# Patient Record
Sex: Male | Born: 1945 | Race: White | Hispanic: No | Smoking: Former smoker
Health system: Southern US, Community
[De-identification: ages and names within clinical notes are randomized; demographics above are authoritative.]

## PROBLEM LIST (undated history)

## (undated) ENCOUNTER — Emergency Department (HOSPITAL_COMMUNITY): Admission: EM | Payer: Medicare Other | Source: Home / Self Care

## (undated) DIAGNOSIS — Z9981 Dependence on supplemental oxygen: Secondary | ICD-10-CM

## (undated) DIAGNOSIS — Z923 Personal history of irradiation: Secondary | ICD-10-CM

## (undated) DIAGNOSIS — I10 Essential (primary) hypertension: Secondary | ICD-10-CM

## (undated) DIAGNOSIS — M545 Low back pain, unspecified: Secondary | ICD-10-CM

## (undated) DIAGNOSIS — G8929 Other chronic pain: Secondary | ICD-10-CM

## (undated) DIAGNOSIS — M549 Dorsalgia, unspecified: Secondary | ICD-10-CM

## (undated) DIAGNOSIS — R51 Headache: Secondary | ICD-10-CM

## (undated) DIAGNOSIS — J9600 Acute respiratory failure, unspecified whether with hypoxia or hypercapnia: Secondary | ICD-10-CM

## (undated) DIAGNOSIS — F329 Major depressive disorder, single episode, unspecified: Secondary | ICD-10-CM

## (undated) DIAGNOSIS — F419 Anxiety disorder, unspecified: Secondary | ICD-10-CM

## (undated) DIAGNOSIS — M1990 Unspecified osteoarthritis, unspecified site: Secondary | ICD-10-CM

## (undated) DIAGNOSIS — J189 Pneumonia, unspecified organism: Secondary | ICD-10-CM

## (undated) DIAGNOSIS — R519 Headache, unspecified: Secondary | ICD-10-CM

## (undated) DIAGNOSIS — F32A Depression, unspecified: Secondary | ICD-10-CM

## (undated) DIAGNOSIS — J449 Chronic obstructive pulmonary disease, unspecified: Secondary | ICD-10-CM

## (undated) DIAGNOSIS — C4920 Malignant neoplasm of connective and soft tissue of unspecified lower limb, including hip: Secondary | ICD-10-CM

## (undated) HISTORY — PX: ELBOW SURGERY: SHX618

## (undated) HISTORY — PX: COLONOSCOPY W/ BIOPSIES AND POLYPECTOMY: SHX1376

## (undated) HISTORY — PX: CARDIAC CATHETERIZATION: SHX172

## (undated) HISTORY — DX: Unspecified osteoarthritis, unspecified site: M19.90

## (undated) HISTORY — DX: Essential (primary) hypertension: I10

## (undated) HISTORY — PX: REPAIR DURAL / CSF LEAK: SUR1169

## (undated) HISTORY — PX: LUMBAR LAMINECTOMY/DECOMPRESSION MICRODISCECTOMY: SHX5026

## (undated) HISTORY — DX: Depression, unspecified: F32.A

## (undated) HISTORY — DX: Malignant neoplasm of connective and soft tissue of unspecified lower limb, including hip: C49.20

## (undated) HISTORY — DX: Low back pain: M54.5

## (undated) HISTORY — PX: LEG SKIN LESION  BIOPSY / EXCISION: SUR473

## (undated) HISTORY — PX: ELBOW FRACTURE SURGERY: SHX616

## (undated) HISTORY — DX: Low back pain, unspecified: M54.50

## (undated) HISTORY — PX: BACK SURGERY: SHX140

## (undated) HISTORY — DX: Major depressive disorder, single episode, unspecified: F32.9

---

## 1977-08-09 DIAGNOSIS — J189 Pneumonia, unspecified organism: Secondary | ICD-10-CM

## 1977-08-09 HISTORY — DX: Pneumonia, unspecified organism: J18.9

## 2003-04-10 ENCOUNTER — Emergency Department (HOSPITAL_COMMUNITY): Admission: EM | Admit: 2003-04-10 | Discharge: 2003-04-10 | Payer: Self-pay | Admitting: Emergency Medicine

## 2003-04-10 ENCOUNTER — Encounter: Payer: Self-pay | Admitting: Emergency Medicine

## 2004-09-07 ENCOUNTER — Ambulatory Visit: Payer: Self-pay | Admitting: Family Medicine

## 2004-09-14 ENCOUNTER — Ambulatory Visit: Payer: Self-pay | Admitting: Family Medicine

## 2006-10-19 ENCOUNTER — Ambulatory Visit: Payer: Self-pay | Admitting: Physician Assistant

## 2006-12-13 ENCOUNTER — Ambulatory Visit (HOSPITAL_COMMUNITY): Admission: RE | Admit: 2006-12-13 | Discharge: 2006-12-13 | Payer: Self-pay | Admitting: Neurosurgery

## 2007-01-04 ENCOUNTER — Emergency Department (HOSPITAL_COMMUNITY): Admission: EM | Admit: 2007-01-04 | Discharge: 2007-01-04 | Payer: Self-pay | Admitting: Emergency Medicine

## 2007-01-06 ENCOUNTER — Inpatient Hospital Stay (HOSPITAL_COMMUNITY): Admission: AD | Admit: 2007-01-06 | Discharge: 2007-01-12 | Payer: Self-pay | Admitting: Neurosurgery

## 2007-01-19 ENCOUNTER — Inpatient Hospital Stay (HOSPITAL_COMMUNITY): Admission: AD | Admit: 2007-01-19 | Discharge: 2007-01-21 | Payer: Self-pay | Admitting: Neurosurgery

## 2007-02-13 ENCOUNTER — Encounter: Admission: RE | Admit: 2007-02-13 | Discharge: 2007-03-09 | Payer: Self-pay | Admitting: Neurosurgery

## 2009-08-09 DIAGNOSIS — C4920 Malignant neoplasm of connective and soft tissue of unspecified lower limb, including hip: Secondary | ICD-10-CM

## 2009-08-09 HISTORY — DX: Malignant neoplasm of connective and soft tissue of unspecified lower limb, including hip: C49.20

## 2009-09-25 DIAGNOSIS — M545 Low back pain, unspecified: Secondary | ICD-10-CM | POA: Insufficient documentation

## 2009-09-25 DIAGNOSIS — J439 Emphysema, unspecified: Secondary | ICD-10-CM

## 2009-09-25 DIAGNOSIS — I10 Essential (primary) hypertension: Secondary | ICD-10-CM

## 2009-09-26 ENCOUNTER — Ambulatory Visit: Payer: Self-pay | Admitting: Pulmonary Disease

## 2009-09-29 ENCOUNTER — Encounter: Payer: Self-pay | Admitting: Pulmonary Disease

## 2009-10-03 ENCOUNTER — Telehealth: Payer: Self-pay | Admitting: Pulmonary Disease

## 2009-10-10 ENCOUNTER — Ambulatory Visit: Payer: Self-pay | Admitting: Pulmonary Disease

## 2009-10-10 ENCOUNTER — Telehealth (INDEPENDENT_AMBULATORY_CARE_PROVIDER_SITE_OTHER): Payer: Self-pay | Admitting: *Deleted

## 2009-10-10 ENCOUNTER — Encounter: Payer: Self-pay | Admitting: Pulmonary Disease

## 2009-10-14 ENCOUNTER — Ambulatory Visit: Payer: Self-pay | Admitting: Pulmonary Disease

## 2009-10-22 ENCOUNTER — Ambulatory Visit: Payer: Self-pay | Admitting: Family Medicine

## 2009-10-22 ENCOUNTER — Encounter (INDEPENDENT_AMBULATORY_CARE_PROVIDER_SITE_OTHER): Payer: Self-pay | Admitting: *Deleted

## 2009-10-22 ENCOUNTER — Encounter: Payer: Self-pay | Admitting: Sports Medicine

## 2009-10-22 DIAGNOSIS — Z7289 Other problems related to lifestyle: Secondary | ICD-10-CM

## 2009-10-22 DIAGNOSIS — F17200 Nicotine dependence, unspecified, uncomplicated: Secondary | ICD-10-CM | POA: Insufficient documentation

## 2009-11-04 ENCOUNTER — Encounter (INDEPENDENT_AMBULATORY_CARE_PROVIDER_SITE_OTHER): Payer: Self-pay | Admitting: *Deleted

## 2009-11-05 ENCOUNTER — Ambulatory Visit: Payer: Self-pay | Admitting: Gastroenterology

## 2009-11-12 ENCOUNTER — Ambulatory Visit: Payer: Self-pay | Admitting: Gastroenterology

## 2009-11-17 ENCOUNTER — Encounter: Payer: Self-pay | Admitting: Gastroenterology

## 2009-11-20 ENCOUNTER — Encounter (INDEPENDENT_AMBULATORY_CARE_PROVIDER_SITE_OTHER): Payer: Self-pay | Admitting: *Deleted

## 2009-12-16 ENCOUNTER — Telehealth: Payer: Self-pay | Admitting: Pulmonary Disease

## 2009-12-18 ENCOUNTER — Ambulatory Visit: Payer: Self-pay | Admitting: Pulmonary Disease

## 2009-12-22 ENCOUNTER — Telehealth (INDEPENDENT_AMBULATORY_CARE_PROVIDER_SITE_OTHER): Payer: Self-pay | Admitting: *Deleted

## 2009-12-23 ENCOUNTER — Telehealth: Payer: Self-pay | Admitting: Pulmonary Disease

## 2009-12-23 ENCOUNTER — Telehealth (INDEPENDENT_AMBULATORY_CARE_PROVIDER_SITE_OTHER): Payer: Self-pay | Admitting: *Deleted

## 2010-01-31 ENCOUNTER — Encounter: Payer: Self-pay | Admitting: Pulmonary Disease

## 2010-04-20 ENCOUNTER — Encounter: Payer: Self-pay | Admitting: Sports Medicine

## 2010-04-20 ENCOUNTER — Ambulatory Visit: Payer: Self-pay | Admitting: Family Medicine

## 2010-04-22 ENCOUNTER — Ambulatory Visit: Payer: Self-pay | Admitting: Family Medicine

## 2010-04-29 ENCOUNTER — Encounter: Payer: Self-pay | Admitting: Sports Medicine

## 2010-04-30 ENCOUNTER — Encounter: Payer: Self-pay | Admitting: *Deleted

## 2010-04-30 ENCOUNTER — Ambulatory Visit: Payer: Self-pay | Admitting: Family Medicine

## 2010-05-06 ENCOUNTER — Ambulatory Visit: Payer: Self-pay | Admitting: Oncology

## 2010-05-07 ENCOUNTER — Encounter: Payer: Self-pay | Admitting: Sports Medicine

## 2010-05-13 ENCOUNTER — Encounter: Admission: RE | Admit: 2010-05-13 | Discharge: 2010-05-13 | Payer: Self-pay | Admitting: Surgery

## 2010-05-25 ENCOUNTER — Ambulatory Visit (HOSPITAL_COMMUNITY): Admission: RE | Admit: 2010-05-25 | Discharge: 2010-05-25 | Payer: Self-pay | Admitting: Surgery

## 2010-06-09 HISTORY — PX: LYMPH NODE DISSECTION: SHX5087

## 2010-06-18 ENCOUNTER — Ambulatory Visit (HOSPITAL_COMMUNITY): Admission: RE | Admit: 2010-06-18 | Discharge: 2010-06-18 | Payer: Self-pay | Admitting: Surgery

## 2010-06-29 ENCOUNTER — Encounter: Payer: Self-pay | Admitting: Sports Medicine

## 2010-07-07 ENCOUNTER — Ambulatory Visit
Admission: RE | Admit: 2010-07-07 | Discharge: 2010-09-08 | Payer: Self-pay | Source: Home / Self Care | Attending: Radiation Oncology | Admitting: Radiation Oncology

## 2010-07-08 ENCOUNTER — Encounter: Payer: Self-pay | Admitting: Sports Medicine

## 2010-07-10 ENCOUNTER — Ambulatory Visit: Payer: Self-pay | Admitting: Oncology

## 2010-07-10 LAB — COMPREHENSIVE METABOLIC PANEL
Albumin: 4.3 g/dL (ref 3.5–5.2)
BUN: 8 mg/dL (ref 6–23)
CO2: 30 mEq/L (ref 19–32)
Calcium: 9.2 mg/dL (ref 8.4–10.5)
Chloride: 90 mEq/L — ABNORMAL LOW (ref 96–112)
Glucose, Bld: 177 mg/dL — ABNORMAL HIGH (ref 70–99)
Potassium: 3.2 mEq/L — ABNORMAL LOW (ref 3.5–5.3)
Sodium: 131 mEq/L — ABNORMAL LOW (ref 135–145)
Total Protein: 7 g/dL (ref 6.0–8.3)

## 2010-07-10 LAB — CBC WITH DIFFERENTIAL/PLATELET
Basophils Absolute: 0.1 10*3/uL (ref 0.0–0.1)
Eosinophils Absolute: 0.1 10*3/uL (ref 0.0–0.5)
HGB: 16.6 g/dL (ref 13.0–17.1)
MCV: 95.8 fL (ref 79.3–98.0)
MONO#: 0.7 10*3/uL (ref 0.1–0.9)
NEUT#: 10 10*3/uL — ABNORMAL HIGH (ref 1.5–6.5)
RBC: 4.92 10*6/uL (ref 4.20–5.82)
RDW: 12 % (ref 11.0–14.6)
WBC: 12.1 10*3/uL — ABNORMAL HIGH (ref 4.0–10.3)

## 2010-07-13 ENCOUNTER — Ambulatory Visit (HOSPITAL_COMMUNITY)
Admission: RE | Admit: 2010-07-13 | Discharge: 2010-07-13 | Payer: Self-pay | Source: Home / Self Care | Admitting: Radiation Oncology

## 2010-08-14 ENCOUNTER — Encounter: Payer: Self-pay | Admitting: Sports Medicine

## 2010-08-14 ENCOUNTER — Ambulatory Visit: Admission: RE | Admit: 2010-08-14 | Discharge: 2010-08-14 | Payer: Self-pay | Source: Home / Self Care

## 2010-08-14 DIAGNOSIS — H9319 Tinnitus, unspecified ear: Secondary | ICD-10-CM | POA: Insufficient documentation

## 2010-08-14 LAB — CONVERTED CEMR LAB
BUN: 5 mg/dL — ABNORMAL LOW (ref 6–23)
CO2: 29 meq/L (ref 19–32)
Calcium: 9.2 mg/dL (ref 8.4–10.5)
Chloride: 107 meq/L (ref 96–112)
Creatinine, Ser: 0.79 mg/dL (ref 0.40–1.50)
Direct LDL: 84 mg/dL
Glucose, Bld: 84 mg/dL (ref 70–99)
Potassium: 3.7 meq/L (ref 3.5–5.3)
Sodium: 142 meq/L (ref 135–145)

## 2010-08-15 ENCOUNTER — Encounter: Payer: Self-pay | Admitting: Sports Medicine

## 2010-09-08 ENCOUNTER — Ambulatory Visit (HOSPITAL_COMMUNITY)
Admission: RE | Admit: 2010-09-08 | Discharge: 2010-09-08 | Payer: Self-pay | Source: Home / Self Care | Attending: Radiation Oncology | Admitting: Radiation Oncology

## 2010-09-08 NOTE — Letter (Signed)
Summary: Previsit letter  [facility]  [address] [place], [place] [address]   Phone: [phone]  Fax: [phone]       10/22/2009 MRN: [id number]  Hector House [address] [place], [place]  [address]  Dear Hector House,  Welcome to the Gastroenterology Division at [facility].    You are scheduled to see a nurse for your pre-procedure visit on 11/05/2009 at 4:00PM on the 3rd floor at [facility], [address]  We ask that you try to arrive at our office 15 minutes prior to your appointment time to allow for check-in.  Your nurse visit will consist of discussing your medical and surgical history, your immediate family medical history, and your medications.    Please bring a complete list of all your medications or, if you prefer, bring the medication bottles and we will list them.  We will need to be aware of both prescribed and over the counter drugs.  We will need to know exact dosage information as well.  If you are on blood thinners (Coumadin, Plavix, Aggrenox, Ticlid, etc.) please call our office today/prior to your appointment, as we need to consult with your physician about holding your medication.   Please be prepared to read and sign documents such as consent forms, a financial agreement, and acknowledgement forms.  If necessary, and with your consent, a friend or relative is welcome to sit-in on the nurse visit with you.  Please bring your insurance card so that we may make a copy of it.  If your insurance requires a referral to see a specialist, please bring your referral form from your primary care physician.  No co-pay is required for this nurse visit.     If you cannot keep your appointment, please call [phone] to cancel or reschedule prior to your appointment date.  This allows [place] the opportunity to schedule an appointment for another patient in need of care.    Thank you for choosing [facility] for your medical needs.  We  appreciate the opportunity to care for you.  Please visit [place] at our website  to learn more about our practice.                     Sincerely.                                                                                                                   The Gastroenterology Division

## 2010-09-08 NOTE — Consult Note (Signed)
Summary: [facility]   Imported By: De Nurse 07/15/2010 15:28:46  _____________________________________________________________________  External Attachment:    Type:   Image     Comment:   External Document

## 2010-09-08 NOTE — Assessment & Plan Note (Signed)
Summary: no visit.  Pt left after pfts without being seen.   Copy to:  [facility] Primary Provider/Referring Provider:  None  CC:  Pt is here for a f/u appt to discuss PFT and CXR results.  .  History of Present Illness: pt left without being seen after pfts.  Allergies: No Known Drug Allergies  Vital Signs:  Patient profile:   65 year old male Height:      71 inches Weight:      120 pounds BMI:     16.80 Cuff size:   regular  Vitals Entered By: Arman Filter LPN (October 11, 1306 1:38 PM)  O2 Flow:  Room air CC: Pt is here for a f/u appt to discuss PFT and CXR results.   Comments Medications reviewed with patient Arman Filter LPN  October 10, 6576 1:38 PM     Other Orders: T-2 View CXR (71020TC) No Charge Patient Arrived (NCPA0) (NCPA0)

## 2010-09-08 NOTE — Assessment & Plan Note (Signed)
Summary: consult for emphysema, dyspnea   Copy to:  [facility] Primary Provider/Referring Provider:  None  CC:  Pulmonary Consult.  History of Present Illness: The pt is a 65y/o male who is referred today for evaluation of dyspnea.  The pt has been told he has emphysema, and apparently recently had spirometry that is unavailable at this time.  He has been having breathing issues for 30yrs, and his symptoms have been progressive in nature.  He can get sob just walking thru his house at times, bringing in groceries from the car, and describes less than one block doe on flat ground.  He does a lot of coughing, productive of white foam and light green at times.  He has been having significant weight loss as well.  I have found an old cxr from 2008 in computer, and shows severe hyperinflation.  The pt currently is on advair that he uses sporadically due to expense.  He also uses as needed proair and duoneb nebulizer treatments two times a day and as needed.  He does not have a primary care physician at this time.  Preventive Screening-Counseling & Management  Alcohol-Tobacco     Smoking Status: current  Current Medications (verified): 1)  Advair Diskus 250-50 Mcg/dose Aepb (Fluticasone-Salmeterol) .Marland Kitchen.. 1 Puff Two Times A Day 2)  Proventil Hfa 108 (90 Base) Mcg/act Aers (Albuterol Sulfate) .... 2 Puffs Every 4-6 Hours As Needed 3)  Ipratropium-Albuterol 0.5-2.5 (3) Mg/69ml Soln (Ipratropium-Albuterol) .Marland Kitchen.. 1 in Neb Two Times A Day 4)  Lisinopril-Hydrochlorothiazide 20-25 Mg Tabs (Lisinopril-Hydrochlorothiazide) .Marland Kitchen.. 1 Once Daily  Allergies (verified): No Known Drug Allergies  Past History:  Past Medical History:  LOW BACK PAIN SYNDROME (ICD-724.2) HYPERTENSION, BENIGN ESSENTIAL (ICD-401.1) EMPHYSEMA (ICD-492.8)  Past Surgical History: back surgery 2007 L elbow surgery 1967  Family History: Reviewed history and no changes required. heart disease: mother (m.i.)    Social History: Reviewed history and no changes required. Patient is a current smoker. Started at age 35.  1 ppd. pt is divorced and now single. pt lives alone. pt is retired.  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  The patient denies non-productive cough, coughing up blood, irregular heartbeats, acid heartburn, indigestion, loss of appetite, weight change, abdominal pain, difficulty swallowing, sore throat, headaches, nasal congestion/difficulty breathing through nose, sneezing, itching, ear ache, hand/feet swelling, joint stiffness or pain, rash, change in color of mucus, and fever.    Vital Signs:  Patient profile:   65 year old male Height:      71 inches Weight:      121.13 pounds BMI:     16.96 O2 Sat:      98 % on Room air Temp:     98.0 degrees F oral Pulse rate:   97 / minute BP sitting:   112 / 74  (left arm) Cuff size:   regular  Vitals Entered By: Arman Filter LPN (September 26, 2009 10:42 AM)  O2 Flow:  Room air CC: Pulmonary Consult Comments Medications reviewed with patient Arman Filter LPN  September 26, 2009 10:42 AM    Physical Exam  General:  thin male in nad Eyes:  PERRLA and EOMI.   Nose:  narrowed but patent, and without discharge Mouth:  clear  Neck:  no jvd, tmg, LN Lungs:  very decreased bs throughout, mild wheezing at both bases Heart:  rrr, no mrg Abdomen:  soft and nontender, bs+ Extremities:  no edema noted, no cyanosis pulses intact distally Neurologic:  alert and oriented, moves all 4.   Impression & Recommendations:  Problem # 1:  EMPHYSEMA (ICD-492.8) The pt has probable severe emphysema based on his history, exam, and cxr from 2008.  He has had recent spirometry,  but I do not have those results.  He actually needs full pfts for documentation, and also a cxr given his recent excessive weight loss.  I have had a long discussion with him about emphysema, and how we will have very little success helping his breathing even with the best of medications if he does not stop smoking.  I have counseled him on smoking cessation.  He is having difficulty affording his meds, even with insurance.  I have reminded him the money spent on cigarettes could be used for his inhalers.  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  Medications Added to Medication List This Visit: 1)  Ipratropium-albuterol 0.5-2.5 (3) Mg/42ml Soln (Ipratropium-albuterol) .Marland Kitchen.. 1 in neb two times a day  Other Orders: Consultation Level IV (30865) Pulmonary Referral (Pulmonary) T-2 View CXR (71020TC)  Patient Instructions: 1)  stay on advair for now one in am and pm. 2)  check your insurance plan to see if mail order is cheaper 3)  consider getting your meds from the [place]. 4)  stop smoking completely 5)  will check cxr today in light of weight loss 6)  will schedule you for breathing tests, and see you back same day. 7)  you need to get primary care doctor.  Appended Document: consult for emphysema, dyspnea megan, I do not think he ever got his cxr.  Please call and find out.  Appended Document: consult for emphysema, dyspnea ATC pt at home #.  Was informed  "person I am trying to reach is not accepting calls at this time."  ATC pt at work #.  Line has been disconnected.  Will send pt a letter to call our office.

## 2010-09-08 NOTE — Procedures (Signed)
Summary: Colonoscopy  Patient: Hector House Note: All result statuses are Final unless otherwise noted.  Tests: (1) Colonoscopy (COL)   COL Colonoscopy           DONE     [facility]     [address]     [place], [place]  [address]           COLONOSCOPY PROCEDURE REPORT           PATIENT:  Hector, House  MR#:  [id number]     BIRTHDATE:  05/02/1946, 63 yrs. old  GENDER:  male     ENDOSCOPIST:  Vania Rea. Jarold Motto, MD, [facility]     REF. BY:     PROCEDURE DATE:  11/12/2009     PROCEDURE:  Colonoscopy with snare polypectomy     ASA CLASS:  Class III     INDICATIONS:  Routine Risk Screening     MEDICATIONS:   Fentanyl 50 mcg IV, Versed 6 mg IV           DESCRIPTION OF PROCEDURE:   After the risks benefits and     alternatives of the procedure were thoroughly explained, informed     consent was obtained.  Digital rectal exam was performed and     revealed no abnormalities.   The LB [id number]-[id number] endoscope     was introduced through the anus and advanced to the cecum, which     was identified by both the appendix and ileocecal valve, without     limitations.  The quality of the prep was excellent, using     MoviPrep.  The instrument was then slowly withdrawn as the colon     was fully examined.     <<PROCEDUREIMAGES>>           FINDINGS:  ENDOSCOPIC FINDINGS:   A sessile polyp was found in the     sigmoid colon.  CM FLAT POLYP HOT SNARE EXCISED. There were     multiple polyps identified and removed. in the right colon. FLAT     6-8 MM POLYPS HOT SNARE EXCISED.  This was otherwise a normal     examination of the colon.   Retroflexed views in the rectum     revealed no abnormalities.    The scope was then withdrawn from     the patient and the procedure completed.           COMPLICATIONS:  None     ENDOSCOPIC IMPRESSION:     1) Polyps, multiple in the right colon     2) Sessile polyp in the sigmoid colon     3) Otherwise normal examination     R/O ADENOMAS.  RECOMMENDATIONS:     1) If the polyp(s) removed today are proven to be adenomatous     (pre-cancerous) polyps, you will need a repeat colonoscopy in 5     years. Otherwise you should continue to follow colorectal cancer     screening guidelines for "routine risk" patients with colonoscopy     in 10 years.     REPEAT EXAM:  No           ______________________________     Vania Rea. Jarold Motto, MD, [facility]           CC:           n.     eSIGNED:   Vania Rea. Patterson at 11/12/2009 11:54 AM  [place], [place], [id number]  Note: An exclamation mark (!) indicates a result that was not dispersed into the flowsheet. Document Creation Date: 11/12/2009 11:55 AM _______________________________________________________________________  (1) Order result status: Final Collection or observation date-time: 11/12/2009 11:50 Requested date-time:  Receipt date-time:  Reported date-time:  Referring Physician:   Ordering Physician: Sheryn Bison [phone]) Specimen Source:  Source: Launa Grill Order Number: [id number] Lab site:   Appended Document: Colonoscopy     Procedures Next Due Date:    Colonoscopy: 11/2014

## 2010-09-08 NOTE — Assessment & Plan Note (Signed)
Summary: rov for severe emphysema   Copy to:  [facility] Primary Provider/Referring Provider:  None  CC:  Follow up to disuss PFTs and CXR.  Hector House  History of Present Illness: the pt comes in today for f/u of his pfts.  He was found to have severe airflow obstruction most c/w emphysema.  He did have a positive response to bronchodilators.  His cxr showed emphysematous changes, but no acute process.  I have gone over the testing with the patient in detail, and have answered all of his questions.  Current Medications (verified): 1)  Advair Diskus 250-50 Mcg/dose Aepb (Fluticasone-Salmeterol) .Hector House.. 1 Puff Two Times A Day 2)  Proventil Hfa 108 (90 Base) Mcg/act Aers (Albuterol Sulfate) .... 2 Puffs Every 4-6 Hours As Needed 3)  Ipratropium-Albuterol 0.5-2.5 (3) Mg/98ml Soln (Ipratropium-Albuterol) .Hector House.. 1 in Neb Two Times A Day 4)  Lisinopril-Hydrochlorothiazide 20-25 Mg Tabs (Lisinopril-Hydrochlorothiazide) .Hector House.. 1 Once Daily  Allergies (verified): No Known Drug Allergies  Vital Signs:  Patient profile:   65 year old male Height:      71 inches Weight:      124 pounds BMI:     17.36 O2 Sat:      94 % on Room air Temp:     97.8 degrees F oral Pulse rate:   100 / minute BP sitting:   112 / 80  (right arm) Cuff size:   regular  Vitals Entered By: Gweneth Dimitri RN (October 14, 2009 9:54 AM)  O2 Flow:  Room air  Serial Vital Signs/Assessments:  Comments: 11:03 AM Ambulatory Pulse Oximetry  Resting; HR__97___    02 Sat__93___  Lap1 (185 feet)   HR__92___   02 Sat__97__ Lap2 (185 feet)   HR__97___   02 Sat__96___    Lap3 (185 feet)   HR__90___   02 Sat__97___  _x__Test Completed without Difficulty ___Test Stopped due to:  By: Michel Bickers CMA   CC: Follow up to disuss PFTs and CXR.   Comments Medications reviewed with patient Daytime contact number verified with patient. Gweneth Dimitri RN  October 14, 2009 9:54 AM    Physical Exam  General:  thin male in  nad   Impression & Recommendations:  Problem # 1:  EMPHYSEMA (ICD-492.8)  The pt has severe emphysema by pfts, and it is unclear how much his airflow obstruction may improve with smoking cessation and an aggressive bronchodilator regimen.  Will add spiriva to his regimen, and also discussed with him the role of vaccinations and pulmonary rehab.  I have also explained that he has very little pulmonary reserve, and the most important thing he can do now is quit smoking.  Time spent with pt today was .  Medications Added to Medication List This Visit: 1)  Spiriva Handihaler 18 Mcg Caps (Tiotropium bromide monohydrate) .... One puff in handihaler daily  Other Orders: Est. Patient Level III (40981) Pneumococcal Vaccine (19147) Admin 1st Vaccine (82956) Misc. Referral (Misc. Ref) Rehabilitation Referral (Rehab) Pulse Oximetry, Ambulatory (21308)  Patient Instructions: 1)  stay on advair 250/50 one in am and pm everyday....rinse mouth well. 2)  start spiriva one each am. 3)  can use proair 2 puffs up to every 6hrs if needed for emergencies when away from home. 4)  Do not use nebulizer machine unless having difficulties...use for rescue on bad days. 5)  will refer to pulmonary rehab program at [facility] 6)  you MUST stop smoking 7)  followup with me in 2mos  Prescriptions: SPIRIVA HANDIHALER 18 MCG  CAPS (TIOTROPIUM BROMIDE MONOHYDRATE) one puff in handihaler daily  #30 x 6   Entered and Authorized by:   Barbaraann Share MD   Signed by:   Barbaraann Share MD on 10/14/2009   Method used:   Print then Give to Patient   RxID:   [id number] PROVENTIL HFA 108 (90 BASE) MCG/ACT AERS (ALBUTEROL SULFATE) 2 puffs every 4-6 hours as needed  #1 x 6   Entered and Authorized by:   Barbaraann Share MD   Signed by:   Barbaraann Share MD on 10/14/2009   Method used:   Print then Give to Patient   RxID:   [id number] ADVAIR DISKUS 250-50 MCG/DOSE AEPB (FLUTICASONE-SALMETEROL) 1 puff two  times a day  #1 x 6   Entered and Authorized by:   Barbaraann Share MD   Signed by:   Barbaraann Share MD on 10/14/2009   Method used:   Print then Give to Patient   RxID:   [id number]    Immunizations Administered:  Pneumonia Vaccine:    Vaccine Type: Pneumovax (Medicare)    Site: left deltoid    Mfr: Merck    Dose: 0.5 ml    Route: IM    Given by: Michel Bickers CMA    Exp. Date: 03/23/2011    Lot #: [id number]    VIS given: 03/06/96 version given October 14, 2009.

## 2010-09-08 NOTE — Miscellaneous (Signed)
Summary: [facility]  Clinical Lists Changes  Medications: Added new medication of MOVIPREP 100 GM  SOLR (PEG-KCL-NACL-NASULF-NA ASC-C) As per prep instructions. - Signed Rx of MOVIPREP 100 GM  SOLR (PEG-KCL-NACL-NASULF-NA ASC-C) As per prep instructions.;  #1 x 0;  Signed;  Entered by: Ezra Sites RN;  Authorized by: Mardella Layman MD [facility];  Method used: [facility]  [address] [phone]*, [address], [place], [place], [place]  [address], Ph: [phone] or [phone], Fax: [phone] Observations: Added new observation of NKA: T (11/05/2009 15:52)    Prescriptions: MOVIPREP 100 GM  SOLR (PEG-KCL-NACL-NASULF-NA ASC-C) As per prep instructions.  #1 x 0   Entered by:   Ezra Sites RN   Authorized by:   Mardella Layman MD [facility]   Signed by:   Ezra Sites RN on 11/05/2009   Method used:   [facility]        [facility]  [address] [phone]* (retail)       [address]       [place], [place]  [address]       Ph: [phone] or [phone]       Fax: [phone]   RxID:   [id number]

## 2010-09-08 NOTE — Progress Notes (Signed)
Summary: returned call  Phone Note Call from Patient   Caller: pt's neighbor Call For: Abagael Kramm Summary of Call: pt's neighbor , diane coble, returned call for pt. (pt's minuets on his track phone are depleted). pt received a letter stating we were trying to contact pt. caller says that you can call her phone to leave msg. and she will get pt to call back. I did let her know that pt has a pft/ rov w/ kc on 10/10/09. [place] # [phone] Initial call taken by: Tivis Ringer, CNA,  October 03, 2009 8:56 AM  Follow-up for Phone Call        spoke with Diane, pt neighbor, and advised that pt did not have CXR done at last appt, so I advised him to come in early on 10/10/09 and get cxr before his pft and appt. Diane states she will tell the pt to do so. CXR placed in IDX. Carron Curie CMA  October 03, 2009 9:38 AM

## 2010-09-08 NOTE — Progress Notes (Signed)
Summary: pt still needs to speak to pcc  Phone Note Call from Patient   Caller: Patient Call For: libby Summary of Call: please call pt within the next "few mins" re: neb/ meds. says he uses [facility] and has never used [facility]. pt cell [phone] Initial call taken by: Tivis Ringer, CNA,  Dec 23, 2009 3:49 PM  Follow-up for Phone Call        [facility] can not get the meds pt is to have so we changed to [facility] Follow-up by: Oneita Jolly,  Dec 23, 2009 4:16 PM  Additional Follow-up for Phone Call Additional follow up Details #1::        pt called back. says he still needs to speak to libby as he has several questions re: [facility]/ nebulizor, etc. says you may need to dial the area code before his #. [phone] or even try [phone]. Tivis Ringer, CNA  Dec 23, 2009 4:27 PM Additional Follow-up by: Oneita Jolly,  Dec 23, 2009 4:53 PM

## 2010-09-08 NOTE — Consult Note (Signed)
Summary: [facility]   Imported By: De Nurse 05/27/2010 15:20:58  _____________________________________________________________________  External Attachment:    Type:   Image     Comment:   External Document

## 2010-09-08 NOTE — Letter (Signed)
Summary: Generic Letter  [facility]  [address]   [place], [place] [address]   Phone: [phone]  Fax: [phone]    11/20/2009  [facility] [address] [place], [place]  [address]  Dear Mr. Heidelberger,  I was asked by Dr Benjamin Stain to send you a note asking for you to call to reschedule you lab appointment.  It is a fasting lab, so please do not eat or drink anything after midnight before, except for black coffee and water.  We look forward to your call and be we'll glad to schedule at your earliest convenience.    Sincerely,    De Nurse

## 2010-09-08 NOTE — Progress Notes (Signed)
Summary: needs ov with [facility]   Phone Note Outgoing Call Call back at neighbor, Lafonda Mosses   Call placed by: Arman Filter LPN,  October 10, 2009 4:03 PM Call placed to: Patient Summary of Call: LM on pt's neighbor's machine for pt to call me back.  Pt was scheduled for PFTs, CXR, and OV with Dr. Shelle Iron.  After pt had PFTs and CXR, he never came back up to the 2nd floor to see Dr. Shelle Iron to discuss test results. Pt needs ov with [facility].  Aundra Millet Reynolds LPN  October 10, 1608 4:04 PM  Initial call taken by: Arman Filter LPN,  October 10, 2009 4:04 PM  Follow-up for Phone Call        left message with pt's neighbor for pt to call me back.  Aundra Millet Reynolds LPN  October 14, 9602 3:47 PM   Pt called back - coming in 10/14/2009 @ 10:00 to see [facility]. Follow-up by: Eugene Gavia,  October 13, 2009 3:55 PM

## 2010-09-08 NOTE — Medication Information (Signed)
Summary: [facility]  [facility]   Imported By: Sherian Rein 02/04/2010 15:40:31  _____________________________________________________________________  External Attachment:    Type:   Image     Comment:   External Document

## 2010-09-08 NOTE — Letter (Signed)
Summary: [facility]  [address]   [place], [place] [address]   Phone: [phone]  Fax: [phone]    09/29/2009  Hector House [address] [place], [place]  [address]  Dear Mr. Fishbaugh,  We have attempted to contact you by phone.  However, the phone number we have on file is incorrect.  Please call our office at your earliest convenience and ask to speak to Dr. Teddy Spike nurse.  Thank you.        Sincerely,   Arman Filter LPN

## 2010-09-08 NOTE — Assessment & Plan Note (Signed)
Summary: rov for severe emphysema   Copy to:  [facility] Primary Provider/Referring Provider:  Rodney Langton, MD  CC:  Pt is here for a 2 month f/u appt.  Pt states he is not taking Spiriva and Advair d/t finances.   Pt states breathing is unchanged from last visit.  Pt states he is using nebs bid.  Pt will use rescue hfa 3 to 4 x per day.  Pt is currently smoking 1/2 ppd. .  History of Present Illness: The pt comes in today for f/u of his known severe emphysema.  Unfortunatley, he continues to smoke, and is not taking any of the medication prescribed last visit due to expense.  He is trying to get by on 2 neb treatments a day, and as needed proair.  He continues to have significant doe, cough, no purulence.  Current Medications (verified): 1)  Proventil Hfa 108 (90 Base) Mcg/act Aers (Albuterol Sulfate) .... 2 Puffs Every 4-6 Hours As Needed 2)  Ipratropium-Albuterol 0.5-2.5 (3) Mg/92ml Soln (Ipratropium-Albuterol) .Marland Kitchen.. 1 in Neb Two Times A Day 3)  Lisinopril-Hydrochlorothiazide 20-25 Mg Tabs (Lisinopril-Hydrochlorothiazide) .Marland Kitchen.. 1 Once Daily 4)  Vicodin 5-500 Mg Tabs (Hydrocodone-Acetaminophen) .... One Tab By Mouth Q4h As Needed Pain  Allergies (verified): No Known Drug Allergies  Social History: Patient is a current smoker. Started at age 38. 2 ppd.  now smokes   1/2 ppd. pt is divorced and now single. pt lives alone. pt is retired.  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  Pt states he is not taking Spiriva and Advair d/t finances.   Pt states breathing is unchanged from last visit.  Pt states he is using nebs bid.  Pt will use rescue hfa 3 to 4 x per day.  Pt is currently smoking 1/2 ppd.  Comments Medications reviewed with patient Arman Filter LPN  Dec 18, 2009 9:05 AM    Physical Exam  General:  thin male in nad Lungs:  diffuse rhonchi, no wheezing Heart:  rrr, no mrg Extremities:  no edema Neurologic:  alert and oriented, moves all 4.   Impression & Recommendations:  Problem # 1:  EMPHYSEMA (ICD-492.8)  the pt has severe emphysema, and unfortunately continues to smoke.  He is unable to afford long acting bronchodilators, and probably would be best served by changing over to nebulizer meds exclusively.  Will go ahead and do this.  I have explained to him again there is little else we can do other than to encourage him to stop smoking.  He has not been interested in pulmonary rehab.  Other Orders: Est. Patient Level III (27253) DME Referral (DME)  Patient Instructions: 1)  will change you over to nebulizer treatments alone.  Therefore,  stop advair and spiriva.  Take duoneb one treatment at breakfast,lunch, dinner, bedtime.  Can use 2 additional treatments if needed for "bad days".  The budesonide should be put in nebulizer 4 times a day only. 2)  stop smoking 3)  followup with me in 4mos.

## 2010-09-08 NOTE — Consult Note (Signed)
Summary: Radiation Oncology  [facility]   Imported By: De Nurse 07/15/2010 15:29:53  _____________________________________________________________________  External Attachment:    Type:   Image     Comment:   External Document

## 2010-09-08 NOTE — Progress Notes (Signed)
Summary: 02  Phone Note Call from Patient   Caller: [facility] Call For: [facility] Summary of Call: pt said kc told him to call [employer] and tell them to come get his 02 but i need an order to dc 02 if that is the case  Initial call taken by: Oneita Jolly,  Dec 23, 2009 5:02 PM  Follow-up for Phone Call        I did not even know he was on oxygen until his last visit.  I did not order the oxygen, and have no idea who did.  That is who needs to stop his oxygen Follow-up by: Barbaraann Share MD,  Dec 24, 2009 1:49 PM     Appended Document: 02 please see above

## 2010-09-08 NOTE — Miscellaneous (Signed)
Summary: Procedure code changed.  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

## 2010-09-08 NOTE — Letter (Signed)
Summary: Patient Notice- Polyp Results  [facility]  [address] [place], [place] [address]   Phone: [phone]  Fax: [phone]        November 17, 2009 MRN: [id number]    DEAGLAN LILE [address] [place], [place]  [address]    Dear Mr. Chambless,  I am pleased to inform you that the colon polyp(s) removed during your recent colonoscopy was (were) found to be benign (no cancer detected) upon pathologic examination.  I recommend you have a repeat colonoscopy examination in 5_ years to look for recurrent polyps, as having colon polyps increases your risk for having recurrent polyps or even colon cancer in the future.  Should you develop new or worsening symptoms of abdominal pain, bowel habit changes or bleeding from the rectum or bowels, please schedule an evaluation with either your primary care physician or with me.  Additional information/recommendations:  X__ No further action with gastroenterology is needed at this time. Please      follow-up with your primary care physician for your other healthcare      needs.  __ Please call [phone] to schedule a return visit to review your      situation.  __ Please keep your follow-up visit as already scheduled.  __ Continue treatment plan as outlined the day of your exam.  Please call us if you are having persistent problems or have questions about your condition that have not been fully answered at this time.  Sincerely,  Mardella Layman MD [facility]  This letter has been electronically signed by your physician.  Appended Document: Patient Notice- Polyp Results letter mailed 4.12.11

## 2010-09-08 NOTE — Assessment & Plan Note (Signed)
Summary: NP,tcb   Vital Signs:  Patient profile:   65 year old male Height:      71 inches Weight:      117.5 pounds BMI:     16.45 Temp:     98.3 degrees F oral Pulse rate:   87 / minute BP sitting:   121 / 75  (left arm) Cuff size:   regular  Vitals Entered By: Gladstone Pih (October 22, 2009 9:20 AM) CC: NP get established Is Patient Diabetic? No Pain Assessment Patient in pain? no        Primary Care Provider:  Rodney Langton, MD  CC:  NP get established.  History of Present Illness: 81M new pt eval.  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 Still smoking but plans to quit next month.  cutting back.  Back pain: s/p laminectomy in 2008, controlled.  preventive medicine.  needs flu shot, colonoscopy, lipids, already had pneumococcal.  Lesion on thigh:  Present months, very painful, tried to pop it but it hurt too much.  Habits & Providers  Alcohol-Tobacco-Diet     Tobacco Status: current     Tobacco Counseling: to quit use of tobacco products     Cigarette Packs/Day: 0.5  Allergies: No Known Drug Allergies  Past History:  Past Medical History: LOW BACK PAIN SYNDROME HYPERTENSION EMPHYSEMA Smoker  Past Surgical History: Laminectomy 2008 L elbow surgery 1967  Social History: Patient is a current smoker. Started at age 72.  1/2 ppd. Quit date: 4/11 pt is divorced and now single. pt lives alone. pt is retired.  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  Eyes:  No corneal or conjunctival inflammation noted. EOMI. Perrla.  Ears:  External ear exam shows no significant lesions or deformities.    Nose:  External nasal examination shows no deformity or inflammation.  Mouth:  Oral mucosa and oropharynx without lesions or exudates.  Lungs:  Normal respiratory effort, chest expands symmetrically. Lungs are clear to auscultation, no crackles or wheezes. Heart:  Normal rate and regular rhythm. S1 and S2 normal without gallop, murmur, click, rub or other extra sounds. Abdomen:  Bowel sounds positive,abdomen soft and non-tender without masses, organomegaly or hernias noted. Extremities:  2x2 cm smooth pearly lesion on right anteromedial thigh.  Very TTP, no drainage.     Impression & Recommendations:  Problem # 1:  ALCOHOL USE (ICD-305.00) Encouraged cutting back.  Problem # 2:  TOBACCO USER (ICD-305.1) Has quit date in mind.  Already cutting back.  Problem # 3:  SPECIAL SCREENING FOR MALIGNANT NEOPLASMS COLON (ICD-V76.51) GI referral made.  Will await screening colonoscopy.  This will be his first.  Orders: Gastroenterology Referral (GI)  Problem # 4:  LOW BACK PAIN SYNDROME (ICD-724.2) Seems stable.  Taking as needed vicodin.  No signs of abuse.  His updated medication list for this problem includes:    Vicodin 5-500 Mg Tabs (Hydrocodone-acetaminophen) ..... One tab by mouth q4h as needed pain  Problem # 5:  HYPERTENSION, BENIGN ESSENTIAL (ICD-401.1) Well controlled.  Due for chem panel.  His updated medication list for this problem includes:    Lisinopril-hydrochlorothiazide 20-25 Mg Tabs (Lisinopril-hydrochlorothiazide) .Marland Kitchen... 1 once daily  Future Orders: Comp Met-[facility] [phone]) ... 10/23/2010 Lipid-[facility] ([id number]) ... 10/23/2010 CBC-[facility] (29562) ... 10/23/2010  Problem # 6:  EMPHYSEMA (ICD-492.8) Followed by pulm.  Severe obstruction, on advair, spiriva, as needed albuterol/atrovent.  Daily prednisone that he has not picked up yet.  Future Orders: Comp Met-[facility] [phone]) ... 10/23/2010 Lipid-[facility] ([id number]) ... 10/23/2010 CBC-[facility] ([id number]) ...  10/23/2010  Problem # 7:  SKIN LESION (ICD-709.9) Unclear etiology.  Appearance and tenderness would suggest abscess/furuncle, duration would suggest that this may be a BCC.  Will have pt come back for procedure visit to remove this lesion.  Complete Medication List: 1)  Advair Diskus 250-50 Mcg/dose Aepb (Fluticasone-salmeterol) .Marland Kitchen.. 1 puff two times a day 2)  Proventil Hfa 108 (90 Base) Mcg/act Aers (Albuterol sulfate) .... 2 puffs every 4-6 hours as needed 3)  Ipratropium-albuterol 0.5-2.5 (3) Mg/24ml Soln (Ipratropium-albuterol) .Marland Kitchen.. 1 in neb two times a day 4)  Lisinopril-hydrochlorothiazide 20-25 Mg Tabs (Lisinopril-hydrochlorothiazide) .Marland Kitchen.. 1 once daily 5)  Spiriva Handihaler 18 Mcg Caps (Tiotropium bromide monohydrate) .... One puff in handihaler daily 6)  Vicodin 5-500 Mg Tabs (Hydrocodone-acetaminophen) .... One tab by mouth q4h as needed pain 7)  Prednisone 20 Mg Tabs (Prednisone) .... One tab by mouth daily  Patient Instructions: 1)  Great to meet you. 2)  Make an appt for a lab visit at the front, come in fasting (no food past midnight) for your morning bloodwork. 3)  Make another appt to see me for remove the lesion on your thigh. 4)  I have made a referral for a colonoscopy. 5)  flu shot. 6)  -Dr. .           Landis Gandy ordered      Appended Document: NP,tcb    Clinical Lists Changes  Observations: Added new observation of HEMOCULTDUE: Not Indicated (11/19/2009 20:07) Added new observation of FLEXSIGDUE: Not Indicated (11/19/2009 20:07)      Flex Sig Next Due:  Not Indicated Hemoccult Next Due:  Not Indicated

## 2010-09-08 NOTE — Assessment & Plan Note (Signed)
Summary: recheck wound per Dr. Smiley Houseman   Vital Signs:  Patient profile:   65 year old male Height:      71 inches Weight:      111 pounds BMI:     15.54 BSA:     1.64 Temp:     98.3 degrees F Pulse rate:   77 / minute BP sitting:   141 / 84  Vitals Entered By: Jone Baseman CMA (April 22, 2010 10:28 AM) CC: f/u wound on leg Is Patient Diabetic? No Pain Assessment Patient in pain? yes     Location: leg Intensity: 2   Primary Care Provider:  Rodney Langton, MD  CC:  f/u wound on leg.  History of Present Illness: 65 yo male, 2d s/p removal of skin lesion on R thigh.  subcuticular stitches placed.  Doing well, no fevers/chills, drainage.  Pain is moderate at incision site.  He took the vicodin and washed them down with some beer and then vomited.  No other complaints.  Habits & Providers  Alcohol-Tobacco-Diet     Tobacco Status: current     Tobacco Counseling: to quit use of tobacco products     Cigarette Packs/Day: 1.5  Allergies: No Known Drug Allergies  Review of Systems       See HPI  Physical Exam  General:  Well-developed,well-nourished,in no acute distress; alert,appropriate and cooperative throughout examination Skin:  Incision site C/D/I, no drainage, redressed with antibiotic ointment and pressure bandage.   Impression & Recommendations:  Problem # 1:  SKIN LESION (ICD-709.9) Assessment Improved Path results not back yet.  Will call pt with results when available. Recommended 1/2 vicodin pill and don't drink beer with meds. RTC as needed.  Orders: [facility]- Est Level  3 (99213)  Complete Medication List: 1)  Proventil Hfa 108 (90 Base) Mcg/act Aers (Albuterol sulfate) .... 2 puffs every 4-6 hours as needed 2)  Ipratropium-albuterol 0.5-2.5 (3) Mg/76ml Soln (Ipratropium-albuterol) .Marland Kitchen.. 1 in neb two times a day 3)  Lisinopril-hydrochlorothiazide 20-25 Mg Tabs (Lisinopril-hydrochlorothiazide) .Marland Kitchen.. 1 once daily 4)  Vicodin 5-500 Mg Tabs  (Hydrocodone-acetaminophen) .... One tab by mouth q4h as needed pain 5)  Keflex 500 Mg Caps (Cephalexin) .... One by mouth two times a day x 7 days

## 2010-09-08 NOTE — Assessment & Plan Note (Signed)
Summary: place on leg that hurts,tcb   Vital Signs:  Patient profile:   65 year old male Height:      71 inches Weight:      114 pounds BMI:     15.96 BSA:     1.66 Temp:     98.4 degrees F Pulse rate:   73 / minute BP sitting:   109 / 73  Vitals Entered By: Jone Baseman CMA (April 20, 2010 3:47 PM) CC: knot on right inner thigh Is Patient Diabetic? No Pain Assessment Patient in pain? no        Primary Care Provider:  Rodney Langton, MD  CC:  knot on right inner thigh.  History of Present Illness: 65 yo male with skin lesion.  Skin Lesion:  Present inner R thigh, seen by me 10/2009 lesion had been present for several months.  Pt was advised that this may be a BCC and advised to RTC for procedure visit.  He never returned.  He returns today with c/o pain and says the lesion is larger.  No fevers/chills, no weight loss, no drainage from lesion.    Habits & Providers  Alcohol-Tobacco-Diet     Tobacco Status: current     Tobacco Counseling: to quit use of tobacco products     Cigarette Packs/Day: 1.5  Current Medications (verified): 1)  Proventil Hfa 108 (90 Base) Mcg/act Aers (Albuterol Sulfate) .... 2 Puffs Every 4-6 Hours As Needed 2)  Ipratropium-Albuterol 0.5-2.5 (3) Mg/68ml Soln (Ipratropium-Albuterol) .Marland Kitchen.. 1 in Neb Two Times A Day 3)  Lisinopril-Hydrochlorothiazide 20-25 Mg Tabs (Lisinopril-Hydrochlorothiazide) .Marland Kitchen.. 1 Once Daily 4)  Vicodin 5-500 Mg Tabs (Hydrocodone-Acetaminophen) .... One Tab By Mouth Q4h As Needed Pain 5)  Keflex 500 Mg Caps (Cephalexin) .... One By Mouth Two Times A Day X 7 Days  Allergies (verified): No Known Drug Allergies  Social History: Packs/Day:  1.5  Review of Systems       See HPI  Physical Exam  General:  Well-developed,well-nourished,in no acute distress; alert,appropriate and cooperative throughout examination Head:  Normocephalic and atraumatic without obvious abnormalities. Eyes:  No corneal or conjunctival  inflammation noted. EOMI. Perrla. Ears:  External ear exam shows no significant lesions or deformities. Nose:  External nasal examination shows no deformity or inflammation. Mouth:  Oral mucosa and oropharynx without lesions or exudates.   Lungs:  Normal respiratory effort, chest expands symmetrically. Lungs are clear to auscultation, no crackles or wheezes. Heart:  Normal rate and regular rhythm. S1 and S2 normal without gallop, murmur, click, rub or other extra sounds. Extremities:  2x2 cm pearly nodule seen on inside of R thigh.  Not indurated, not draining. TTP.  Few ectatic vessels seen on surface of nodule.  Additional Exam:  Excisional biopsy: Pt consented, explained risks, benefits, alternatives. Area cleansed with alcohol. 10cc lidocaine with epi infiltrated in a field block around the lesion. anesthesia confirmed. Area prepped and draped in a sterile fashion. Skin marker used to make an elliptical outline in the longitudinal axis. #11 blade used to trace around the elliptical incision. Forceps then used to grap lesion and sharp dissection carried out under lesion. Subcutaneous fat dissected all the way through and lesion removed and sent to pathology for analysis with margin analysis. 3-0 vicryl used to place a running subcuticular stitch to close the incision. Area covered with antibiotic ointment and dressed with tegaderm. ACE bandage then wrapped around thigh to provide pressure hemostasis. EBL: 5-10cc. Pt stable.  Aftercare advised.   Impression &  Recommendations:  Problem # 1:  SKIN LESION (ICD-709.9) Assessment Deteriorated Excisional biopsy performed.   Sent to pathology for analysis. DDx includes cyst, ingrown hair, BCC. Will await path results and fu. RTC 3d for wound check. Vicodin for pain. Keflex empiric for 7d.  Orders: Excise lesion (TAL) 1.1 - 2.0 cm (11402) [facility]- Est  Level 4 (99214)  Complete Medication List: 1)  Proventil Hfa 108 (90 Base) Mcg/act  Aers (Albuterol sulfate) .... 2 puffs every 4-6 hours as needed 2)  Ipratropium-albuterol 0.5-2.5 (3) Mg/61ml Soln (Ipratropium-albuterol) .Marland Kitchen.. 1 in neb two times a day 3)  Lisinopril-hydrochlorothiazide 20-25 Mg Tabs (Lisinopril-hydrochlorothiazide) .Marland Kitchen.. 1 once daily 4)  Vicodin 5-500 Mg Tabs (Hydrocodone-acetaminophen) .... One tab by mouth q4h as needed pain 5)  Keflex 500 Mg Caps (Cephalexin) .... One by mouth two times a day x 7 days  Patient Instructions: 1)  Great to see you 2)  Took off the skin lesion, will send it for analysis. 3)  Keflex 500 two times a day x 7 days. 4)  Vicodin for pain. 5)  Come back to see me in 3 days to check the wound. 6)  No showering for the first 48h. 7)  If it gets red, starts leaking, or you get fevers or chills, or it doesn't stop bleeding then come back to see me. 8)  -Dr. Karie Schwalbe. Prescriptions: VICODIN 5-500 MG TABS (HYDROCODONE-ACETAMINOPHEN) One tab by mouth q4h as needed pain  #20 x 0   Entered and Authorized by:   Rodney Langton MD   Signed by:   Rodney Langton MD on 04/20/2010   Method used:   Print then Give to Patient   RxID:   [id number] KEFLEX 500 MG CAPS (CEPHALEXIN) One by mouth two times a day x 7 days  #14 x 0   Entered and Authorized by:   Rodney Langton MD   Signed by:   Rodney Langton MD on 04/20/2010   Method used:   Print then Give to Patient   RxID:   [id number]   Appended Document: place on leg that hurts,tcb Path reveils liemyosarcoma.  Code changed on problem list.  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

## 2010-09-08 NOTE — Progress Notes (Signed)
Summary: nos appt  Phone Note Call from Patient   Caller: [email or web address]  Call For: [facility] Summary of Call: Rsc nos from 5/9 to 5/12 @ 9:15a. Initial call taken by: Darletta Moll,  Dec 16, 2009 10:09 AM

## 2010-09-08 NOTE — Miscellaneous (Signed)
Summary: re: referral to Cancer Ctr/ts  Clinical Lists Changes called and lmvm for new pt co-ordinator. waiting for call back.Arlyss Repress CMA,  April 30, 2010 12:20 PM called the cancer center again and lmvm for new pt co-ordinator to call back.Arlyss Repress CMA,  May 04, 2010 11:32 AM per new pt co-ordinator: faxed request and info to [facility] [phone] .Arlyss Repress CMA,  May 04, 2010 3:52 PM

## 2010-09-08 NOTE — Assessment & Plan Note (Signed)
Summary: f/u biopsy results   Vital Signs:  Patient profile:   65 year old male Weight:      114.8 pounds Temp:     99.2 degrees F oral Pulse rate:   80 / minute BP sitting:   130 / 80  (left arm) Cuff size:   regular  Vitals Entered By: Arlyss Repress CMA, (April 30, 2010 10:18 AM) CC: f/up biopsy. referral to surgeon? Is Patient Diabetic? No Pain Assessment Patient in pain? yes     Location: right leg Intensity: 3 Onset of pain  biopsy   Primary Care Provider:  Rodney Langton, MD  CC:  f/up biopsy. referral to surgeon?.  History of Present Illness: 65 yo male returns for fu of excisional bx results.  S/p removal of skin lesion on R thigh 04/20/10. Vicryl subcuticular stitches placed.  Mild incision site pain.  No fevers/chills/wt loss, night sweats. 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     Habits & Providers  Alcohol-Tobacco-Diet     Tobacco Status: current     Tobacco Counseling: to quit use of tobacco products     Cigarette Packs/Day: 1.0  Current Medications (verified): 1)  Proventil Hfa 108 (90 Base) Mcg/act Aers (Albuterol Sulfate) .... 2 Puffs Every 4-6 Hours As Needed 2)  Ipratropium-Albuterol 0.5-2.5 (3) Mg/26ml Soln (Ipratropium-Albuterol) .Marland Kitchen.. 1 in Neb Two Times A Day 3)  Lisinopril-Hydrochlorothiazide 20-25 Mg Tabs (Lisinopril-Hydrochlorothiazide) .Marland Kitchen.. 1 Once Daily 4)  Mobic 7.5 Mg Tabs (Meloxicam) .... One To 2 Tabs By Mouth Daily For Pain.  Allergies (verified): No Known Drug Allergies  Past History:  Past Medical History: Last updated: 10/22/2009 LOW BACK PAIN SYNDROME HYPERTENSION EMPHYSEMA Smoker  Past Surgical History: Last updated: 10/22/2009 Laminectomy 2008 L elbow surgery 1967  Family History: Last updated: 09/26/2009 heart disease: mother (m.i.)   Social History: Last updated: 12/18/2009 Patient is a current smoker. Started at age 69. 2 ppd.  now  smokes   1/2 ppd. pt is divorced and now single. pt lives alone. pt is retired.  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 Lungs are clear to auscultation, no crackles or wheezes. Heart:  Normal rate and regular rhythm. S1 and S2 normal without gallop, murmur, click, rub or other extra sounds. Extremities:  Incision site healing, small seroma likely present under incision.  Mildly TTP, no drainage, no erythema, no warmth.  Dressings CDI.   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   Will refer to surgery for wider excision as I don't think we could take as much tissue as needed in the office under local anesthesia. Referral to oncology as leiomyosarcoma can occasionally need further chest imaging, PET, staging workup. Will forward biopsy results, office notes to [facility] and oncology.  Orders: Oncology Referral (Oncology) [facility]- Est Level  3 ([address]) Surgical Referral (Surgery)  Complete Medication List: 1)  Proventil Hfa 108 (90 Base) Mcg/act Aers (Albuterol sulfate) .... 2 puffs every 4-6 hours as needed 2)  Ipratropium-albuterol 0.5-2.5 (3) Mg/67ml Soln (Ipratropium-albuterol) .Marland Kitchen.. 1 in neb two times  a day 3)  Lisinopril-hydrochlorothiazide 20-25 Mg Tabs (Lisinopril-hydrochlorothiazide) .Marland Kitchen.. 1 once daily 4)  Mobic 7.5 Mg Tabs (Meloxicam) .... One to 2 tabs by mouth daily for pain.  Patient Instructions: 1)  Great to see you, 2)  We will coordinate your care with the surgeon for a re-excision of your skin lesion. 3)   I also will have you see an oncologist to help with testing to ensure there has been no spread in the skin cancer. (Leiomyosarcoma) 4)  -Dr. Karie Schwalbe. Prescriptions: MOBIC 7.5 MG TABS (MELOXICAM) One to 2 tabs by mouth daily for pain.  #30 x 0   Entered and Authorized by:   Rodney Langton MD   Signed by:   Rodney Langton MD on 04/30/2010   Method used:   [facility]        [facility] [address] [phone]* (retail)       [address]       [place], [place]  [address]       Ph: [phone] or [phone]       Fax: [phone]   RxID:   [id number]   Appended Document: Orders Update    Clinical Lists Changes  Orders: Added new Service order of Post-Op Check [phone]) - Signed

## 2010-09-08 NOTE — Letter (Signed)
Summary: [facility] Instructions  [facility]  [address] [place], [place] [address]   Phone: [phone]  Fax: [phone]       Hector House    14-Sep-1945    MRN: [id number]        Procedure Day Dorna Bloom: Wednesday 11/12/2009     Arrival Time: 10:00 am      Procedure Time: 11:00 am     Location of Procedure:                    _x _  [facility] (4th Floor)                        PREPARATION FOR COLONOSCOPY WITH MOVIPREP   Starting 5 days prior to your procedure Friday 4/1 do not eat nuts, seeds, popcorn, corn, beans, peas,  salads, or any raw vegetables.  Do not take any fiber supplements (e.g. Metamucil, Citrucel, and Benefiber).  THE DAY BEFORE YOUR PROCEDURE         DATE: Tuesday 4/5  1.  Drink clear liquids the entire day-NO SOLID FOOD  2.  Do not drink anything colored red or purple.  Avoid juices with pulp.  No orange juice.  3.  Drink at least 64 oz. (8 glasses) of fluid/clear liquids during the day to prevent dehydration and help the prep work efficiently.  CLEAR LIQUIDS INCLUDE: Water Jello Ice Popsicles Tea (sugar ok, no milk/cream) Powdered fruit flavored drinks Coffee (sugar ok, no milk/cream) Gatorade Juice: apple, white grape, white cranberry  Lemonade Clear bullion, consomm, broth Carbonated beverages (any kind) Strained chicken noodle soup Hard Candy                             4.  In the morning, mix first dose of MoviPrep solution:    Empty 1 Pouch A and 1 Pouch B into the disposable container    Add lukewarm drinking water to the top line of the container. Mix to dissolve    Refrigerate (mixed solution should be used within 24 hrs)  5.  Begin drinking the prep at 5:00 p.m. The MoviPrep container is divided by 4 marks.   Every 15 minutes drink the solution down to the next mark (approximately 8 oz) until the full liter is complete.   6.  Follow completed prep with 16 oz of clear liquid of your choice (Nothing red  or purple).  Continue to drink clear liquids until bedtime.  7.  Before going to bed, mix second dose of MoviPrep solution:    Empty 1 Pouch A and 1 Pouch B into the disposable container    Add lukewarm drinking water to the top line of the container. Mix to dissolve    Refrigerate  THE DAY OF YOUR PROCEDURE      DATE: Wednesday 4/6  Beginning at 6:00 a.m. (5 hours before procedure):         1. Every 15 minutes, drink the solution down to the next mark (approx 8 oz) until the full liter is complete.  2. Follow completed prep with 16 oz. of clear liquid of your choice.    3. You may drink clear liquids until 9:00 am (2 HOURS BEFORE PROCEDURE).   MEDICATION INSTRUCTIONS  Unless otherwise instructed, you should take regular prescription medications with a small sip of water   as early as possible the morning of your  procedure.    Additional medication instructions:  Do not take Lisinopril/HCTZ morning of procedure._         OTHER INSTRUCTIONS  You will need a responsible adult at least 64 years of age to accompany you and drive you home.   This person must remain in the waiting room during your procedure.  Wear loose fitting clothing that is easily removed.  Leave jewelry and other valuables at home.  However, you may wish to bring a book to read or  an iPod/MP3 player to listen to music as you wait for your procedure to start.  Remove all body piercing jewelry and leave at home.  Total time from sign-in until discharge is approximately 2-3 hours.  You should go home directly after your procedure and rest.  You can resume normal activities the  day after your procedure.  The day of your procedure you should not:   Drive   Make legal decisions   Operate machinery   Drink alcohol   Return to work  You will receive specific instructions about eating, activities and medications before you leave.    The above instructions have been reviewed and explained to  me by   Ezra Sites RN  November 05, 2009 4:28 PM     I fully understand and can verbalize these instructions _____________________________ Date _________

## 2010-09-08 NOTE — Miscellaneous (Signed)
Summary: Orders Update pft charges  Clinical Lists Changes  Orders: Added new Service order of Carbon Monoxide diffusing w/capacity (94720) - Signed Added new Service order of Lung Volumes (94240) - Signed Added new Service order of Spirometry (Pre & Post) (94060) - Signed 

## 2010-09-08 NOTE — Miscellaneous (Signed)
Summary: Procedure consent  Procedure consent   Imported By: De Nurse 04/21/2010 16:25:20  _____________________________________________________________________  External Attachment:    Type:   Image     Comment:   External Document

## 2010-09-08 NOTE — Progress Notes (Signed)
Summary: neb meds  Phone Note From Other Clinic   Caller: [facility] Call For: clance Summary of Call: [facility] ordered pulmocort .25 qid and they only do two times a day  Initial call taken by: Oneita Jolly,  Dec 22, 2009 10:47 AM  Follow-up for Phone Call        please advise, pulmicort 0.25 can only be done twice daily. Carron Curie CMA  Dec 22, 2009 10:52 AM   Additional Follow-up for Phone Call Additional follow up Details #1::        then we need to change him to company that will let him take 0.5mg  twice a day or 0.25mg  4 times a day. let pt know that 0.5mg  a day is a SUBTHERAPEUTIC dose. Additional Follow-up by: Barbaraann Share MD,  Dec 22, 2009 5:28 PM    Additional Follow-up for Phone Call Additional follow up Details #2::    order faxed to [facility] to get pr pulmicort .5bid  Follow-up by: Oneita Jolly,  Dec 23, 2009 9:20 AM

## 2010-09-08 NOTE — Letter (Signed)
Summary: *Referral Letter GI for screening colonoscopy  [facility]  [address]   [place], [place] [address]   Phone: [phone]  Fax: [phone]    10/22/2009  Thank you in advance for agreeing to see my patient:  Hector House [address] [place], [place]  [address]  Phone: [phone]  Reason for Referral: 65 year old male with emphysema, HTN due for screening colonoscopy.  He has never had one.  Procedures Requested: Screening colonoscopy.  Current Medical Problems: 1)  SKIN LESION (ICD-709.9) 2)  ALCOHOL USE (ICD-305.00) 3)  TOBACCO USER (ICD-305.1) 4)  SPECIAL SCREENING FOR MALIGNANT NEOPLASMS COLON (ICD-V76.51) 5)  LOW BACK PAIN SYNDROME (ICD-724.2) 6)  HYPERTENSION, BENIGN ESSENTIAL (ICD-401.1) 7)  EMPHYSEMA (ICD-492.8)   Current Medications: 1)  ADVAIR DISKUS 250-50 MCG/DOSE AEPB (FLUTICASONE-SALMETEROL) 1 puff two times a day 2)  PROVENTIL HFA 108 (90 BASE) MCG/ACT AERS (ALBUTEROL SULFATE) 2 puffs every 4-6 hours as needed 3)  IPRATROPIUM-ALBUTEROL 0.5-2.5 (3) MG/3ML SOLN (IPRATROPIUM-ALBUTEROL) 1 in neb two times a day 4)  LISINOPRIL-HYDROCHLOROTHIAZIDE 20-25 MG TABS (LISINOPRIL-HYDROCHLOROTHIAZIDE) 1 once daily 5)  SPIRIVA HANDIHALER 18 MCG  CAPS (TIOTROPIUM BROMIDE MONOHYDRATE) one puff in handihaler daily 6)  VICODIN 5-500 MG TABS (HYDROCODONE-ACETAMINOPHEN) One tab by mouth q4h as needed pain 7)  PREDNISONE 20 MG TABS (PREDNISONE) One tab by mouth daily    Thank you again for agreeing to see our patient; please contact us if you have any further questions or need additional information.  Sincerely,  Rodney Langton MD

## 2010-09-09 ENCOUNTER — Encounter: Payer: Self-pay | Admitting: Radiation Oncology

## 2010-09-09 ENCOUNTER — Ambulatory Visit: Payer: Medicare Other | Attending: Radiation Oncology | Admitting: Radiation Oncology

## 2010-09-09 DIAGNOSIS — Z510 Encounter for antineoplastic radiation therapy: Secondary | ICD-10-CM | POA: Insufficient documentation

## 2010-09-09 DIAGNOSIS — L538 Other specified erythematous conditions: Secondary | ICD-10-CM | POA: Insufficient documentation

## 2010-09-09 DIAGNOSIS — C4920 Malignant neoplasm of connective and soft tissue of unspecified lower limb, including hip: Secondary | ICD-10-CM | POA: Insufficient documentation

## 2010-09-10 NOTE — Assessment & Plan Note (Signed)
Summary: refills for bp meds/bmc   Vital Signs:  Patient profile:   65 year old male Height:      71 inches Weight:      118.2 pounds BMI:     16.55 Temp:     98.2 degrees F oral Pulse rate:   100 / minute BP sitting:   156 / 91  (left arm) Cuff size:   regular  Vitals Entered By: Jimmy Footman, CMA (August 14, 2010 9:20 AM) CC: bp med refill   Primary Care Provider:  Rodney Langton, MD  CC:  bp med refill.  History of Present Illness: 65yo male here for fu.  Thigh malignancy:  path showed leiomyosarcoma.  Currently s/p wide excision with clear margins.  Now getting radiation.  Skin healed well but slightly itchy with radiation.  Pt aware this is normal.  Hearing:  Having some tinnitus.  Not so much difficulty hearing as described by pt.  HTN: BP elevated but has been off meds for 10 days now.  Smoking: Would like to try something for cessation.  Has tried Chantix which gave him stomach upset.  Has tried patches which were too expensive.  Hasn't tried gum or Zyban.  Habits & Providers  Alcohol-Tobacco-Diet     Tobacco Status: current  Current Medications (verified): 1)  Proventil Hfa 108 (90 Base) Mcg/act Aers (Albuterol Sulfate) .... 2 Puffs Every 4-6 Hours As Needed 2)  Ipratropium-Albuterol 0.5-2.5 (3) Mg/60ml Soln (Ipratropium-Albuterol) .Marland Kitchen.. 1 in Neb Two Times A Day 3)  Lisinopril-Hydrochlorothiazide 20-25 Mg Tabs (Lisinopril-Hydrochlorothiazide) .Marland Kitchen.. 1 Once Daily 4)  Bupropion Hcl 150 Mg Xr12h-Tab (Bupropion Hcl) .... One Tab By Mouth Two Times A Day For  7-10 Weeks.  Stop Smoking After 1 Week of Therapy 5)  Debrox 6.5 % Soln (Carbamide Peroxide) .Marland Kitchen.. 10 Drops in Left Ear Two Times A Day For 4 Days  Allergies (verified): No Known Drug Allergies  Review of Systems       See hPI  Physical Exam  General:  Well-developed,well-nourished,in no acute distress; alert,appropriate and cooperative throughout examination Head:  Normocephalic and atraumatic without  obvious abnormalities.  Eyes:  No corneal or conjunctival inflammation noted. EOMI. Perrla. Ears:  B/L cerumen impaction. R side cleared, TM normal. L side attempted to be flushed and curettaged however we got out copious amounts of cerumen, but he was having too much pain to continue. Lungs:  Normal respiratory effort, chest expands symmetrically. Lungs are clear to auscultation, no crackles or wheezes. Heart:  Normal rate and regular rhythm. S1 and S2 normal without gallop, murmur, click, rub or other extra sounds. Abdomen:  Bowel sounds positive,abdomen soft and non-tender without masses, organomegaly or hernias noted.   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 As unable to completely clear L EAM even after instilling lidocaine into canal, will rx debrox and have pt RTC 1 week to recheck.  Orders: [facility]- Est  Level 4 ([address])  Problem # 2:  TOBACCO USER (ICD-305.1) Bupropion trial.  Orders: [facility]- Est  Level 4 ([address])  Problem # 3:  HYPERTENSION, BENIGN ESSENTIAL (ICD-401.1) Refilled. RTC 1 week to reassess.  His updated medication list for this problem includes:    Lisinopril-hydrochlorothiazide 20-25 Mg Tabs (Lisinopril-hydrochlorothiazide) .Marland Kitchen... 1 once daily  Orders: Basic Met-[facility] [phone]) Direct LDL-[facility] [phone]) [facility]- Est  Level 4 ([address])  Problem # 4:  Preventive Health Care (ICD-V70.0) Checking dlDL.  Complete Medication List: 1)  Proventil  Hfa 108 (90 Base) Mcg/act Aers (Albuterol sulfate) .... 2 puffs every 4-6 hours as needed 2)  Ipratropium-albuterol 0.5-2.5 (3) Mg/55ml Soln (Ipratropium-albuterol) .Marland Kitchen.. 1 in neb two times a day 3)  Lisinopril-hydrochlorothiazide 20-25 Mg Tabs (Lisinopril-hydrochlorothiazide) .Marland Kitchen.. 1 once daily 4)  Bupropion Hcl 150 Mg Xr12h-tab (Bupropion hcl) .... One tab by mouth two times a day for  7-10 weeks.  stop smoking after 1 week of therapy 5)   Debrox 6.5 % Soln (Carbamide peroxide) .Marland Kitchen.. 10 drops in left ear two times a day for 4 days  Patient Instructions: 1)  Bupropion as directed to help stop smoking. 2)  Refilled your meds. 3)  Checking some bloodwork. 4)  Come back after taking your BP meds for a week. 5)  -Dr. Karie Schwalbe. Prescriptions: DEBROX 6.5 % SOLN (CARBAMIDE PEROXIDE) 10 drops in left ear two times a day for 4 days  #1 bottle x 0   Entered and Authorized by:   Rodney Langton MD   Signed by:   Rodney Langton MD on 08/14/2010   Method used:   [facility]        [facility] [address] [phone]* (retail)       [address]       [place], [place]  [address]       Ph: [phone] or [phone]       Fax: [phone]   RxID:   [id number] LISINOPRIL-HYDROCHLOROTHIAZIDE 20-25 MG TABS (LISINOPRIL-HYDROCHLOROTHIAZIDE) 1 once daily  #90 x 3   Entered and Authorized by:   Rodney Langton MD   Signed by:   Rodney Langton MD on 08/14/2010   Method used:   Electronically to        [facility] [address] [phone]* (retail)       [address]       [place], [place]  [address]       Ph: [phone] or [phone]       Fax: [phone]   RxID:   [id number] PROVENTIL HFA 108 (90 BASE) MCG/ACT AERS (ALBUTEROL SULFATE) 2 puffs every 4-6 hours as needed  #3 x 6   Entered and Authorized by:   Rodney Langton MD   Signed by:   Rodney Langton MD on 08/14/2010   Method used:   Electronically to        [facility] [address] [phone]* (retail)       [address]       [place], [place]  [address]       Ph: [phone] or [phone]       Fax: [phone]   RxID:   [id number] BUPROPION HCL 150 MG XR12H-TAB (BUPROPION HCL) One tab by mouth two times a day for  7-10 weeks.  Stop smoking after 1 week of therapy  #60 x 1   Entered and Authorized by:   Rodney Langton MD   Signed by:   Rodney Langton MD on 08/14/2010    Method used:   Electronically to        [facility] [address] [phone]* (retail)       [address]       [place], [place]  [address]       Ph: [phone] or [phone]       Fax: [phone]   RxID:  [id number]    Orders Added: 1)  Basic Met-[facility] [14782-95621] 2)  Direct LDL-[facility] [83721-81033] 3)  [facility]- Est  Level 4 [[address]]

## 2010-09-10 NOTE — Letter (Signed)
Summary: Lipid Letter  [facility]  [address]   [place], [place] [address]   Phone: [phone]  Fax: [phone]    08/15/2010  Hector House [address] [place], [place]  [address]  Dear Hector House:  We have carefully reviewed your last lipid profile from  and the results are noted below with a summary of recommendations for lipid management.    LDL "bad" Cholesterol: 84       Goal: <100    Good job, and keep doing what you are doing!    TLC Diet (Therapeutic Lifestyle Change): Saturated Fats & Transfatty acids should be kept < 7% of total calories ***Reduce Saturated Fats Polyunstaurated Fat can be up to 10% of total calories Monounsaturated Fat Fat can be up to 20% of total calories Total Fat should be no greater than 25-35% of total calories Carbohydrates should be 50-60% of total calories Protein should be approximately 15% of total calories Fiber should be at least 20-30 grams a day ***Increased fiber may help lower LDL Total Cholesterol should be < 200mg /day Consider adding plant stanol/sterols to diet (example: Benacol spread) ***A higher intake of unsaturated fat may reduce Triglycerides and Increase HDL    Adjunctive Measures (may lower LIPIDS and reduce risk of Heart Attack) include: Aerobic Exercise (20-30 minutes 3-4 times a week) Limit Alcohol Consumption Weight Reduction Aspirin 75-81 mg a day by mouth (if not allergic or contraindicated) Dietary Fiber 20-30 grams a day by mouth     Current Medications: 1)    Proventil Hfa 108 (90 Base) Mcg/act Aers (Albuterol sulfate) .... 2 puffs every 4-6 hours as needed 2)    Ipratropium-albuterol 0.5-2.5 (3) Mg/53ml Soln (Ipratropium-albuterol) .Marland Kitchen.. 1 in neb two times a day 3)    Lisinopril-hydrochlorothiazide 20-25 Mg Tabs (Lisinopril-hydrochlorothiazide) .Marland Kitchen.. 1 once daily 4)    Bupropion Hcl 150 Mg Xr12h-tab (Bupropion hcl) .... One tab by mouth two times a day for  7-10 weeks.  stop smoking after 1  week of therapy 5)    Debrox 6.5 % Soln (Carbamide peroxide) .Marland Kitchen.. 10 drops in left ear two times a day for 4 days  If you have any questions, please call. We appreciate being able to work with you.   Sincerely,    [facility] Rodney Langton MD  Appended Document: Lipid Letter sent

## 2010-10-16 ENCOUNTER — Ambulatory Visit: Payer: Medicare Other | Attending: Radiation Oncology | Admitting: Radiation Oncology

## 2010-10-22 LAB — DIFFERENTIAL
Basophils Absolute: 0 10*3/uL (ref 0.0–0.1)
Eosinophils Absolute: 0.3 10*3/uL (ref 0.0–0.7)
Eosinophils Relative: 5 % (ref 0–5)
Lymphocytes Relative: 26 % (ref 12–46)
Monocytes Absolute: 0.6 10*3/uL (ref 0.1–1.0)

## 2010-10-22 LAB — COMPREHENSIVE METABOLIC PANEL
ALT: 23 U/L (ref 0–53)
AST: 26 U/L (ref 0–37)
Albumin: 4.4 g/dL (ref 3.5–5.2)
CO2: 30 mEq/L (ref 19–32)
Chloride: 94 mEq/L — ABNORMAL LOW (ref 96–112)
Creatinine, Ser: 0.74 mg/dL (ref 0.4–1.5)
GFR calc Af Amer: >60 mL/min (ref >60)
Potassium: 3.9 mEq/L (ref 3.5–5.1)
Sodium: 135 mEq/L (ref 135–145)
Total Bilirubin: 0.9 mg/dL (ref 0.3–1.2)

## 2010-10-22 LAB — CBC
Hemoglobin: 17.5 g/dL — ABNORMAL HIGH (ref 13.0–17.0)
MCH: 34.2 pg — ABNORMAL HIGH (ref 26.0–34.0)
Platelets: 203 10*3/uL (ref 150–400)
RBC: 5.11 MIL/uL (ref 4.22–5.81)
WBC: 6.5 10*3/uL (ref 4.0–10.5)

## 2010-10-28 ENCOUNTER — Encounter: Payer: Self-pay | Admitting: Sports Medicine

## 2010-10-28 ENCOUNTER — Ambulatory Visit (INDEPENDENT_AMBULATORY_CARE_PROVIDER_SITE_OTHER): Payer: Medicare Other | Admitting: Sports Medicine

## 2010-10-28 DIAGNOSIS — I10 Essential (primary) hypertension: Secondary | ICD-10-CM

## 2010-10-28 DIAGNOSIS — H9319 Tinnitus, unspecified ear: Secondary | ICD-10-CM

## 2010-10-28 NOTE — Assessment & Plan Note (Signed)
S/p debrox, and today flushing. L EAM completely clear now. RTC prn.

## 2010-10-28 NOTE — Progress Notes (Signed)
  Subjective:    Patient ID: Hector House, male    DOB: 04/14/1946, 65 y.o.   MRN: [id number]  HPI Cerumen, tinnitus:  Unable to fully remove cerumen at last visit via curettage and flushing.  Has been using debrox for 1.5 weeks, returns for re-eval.  HTN:  Well controlled today.     Review of Systems    See HPI Objective:   Physical Exam  Constitutional: He appears well-developed and well-nourished. No distress.  HENT:       Left EAM occluded with cerumen. Flushed, copious amounts of cerumen removed.  TM then seen clear.  Hearing MUCH improved, tinnitus resolved.    Skin: Skin is warm and dry.          Assessment & Plan:

## 2010-10-28 NOTE — Assessment & Plan Note (Signed)
Well controlled today now that he is taking his medications.

## 2010-12-14 ENCOUNTER — Encounter (INDEPENDENT_AMBULATORY_CARE_PROVIDER_SITE_OTHER): Payer: Self-pay | Admitting: Surgery

## 2010-12-18 ENCOUNTER — Ambulatory Visit: Payer: Medicare Other | Attending: Radiation Oncology | Admitting: Radiation Oncology

## 2010-12-22 NOTE — Op Note (Signed)
NAME:  ZAKARIYE, NEE NO.:  [id number]   MEDICAL RECORD NO.:  [id number]          PATIENT TYPE:  INP   LOCATION:  3114                         FACILITY:  [facility]   PHYSICIAN:  Donalee Citrin, M.D.        DATE OF BIRTH:  Sep 15, 1945   DATE OF PROCEDURE:  01/06/2007  DATE OF DISCHARGE:                               OPERATIVE REPORT   PREOPERATIVE DIAGNOSIS:  Cerebrospinal fluid leak status post lumbar  surgery.   PROCEDURE:  Lumbar drain placement.   ANESTHESIA:  Local.   SURGEON:  Donalee Citrin, M.D.   HISTORY OF PRESENT ILLNESS:  The patient is a 65 year old gentleman who  underwent lumbar surgery about a month ago.  The patient presented with  progressive swelling around the lumbar incision and clear fluid drainage  out of his wound.  He was seen in the office and transferred to [facility] for evaluation and drain placement.   PROCEDURE IN DETAIL:  The patient was induced under local anesthesia and  assessing the level immediately superior to his incision.  Then after  adequate prepping and draping, this was locally injected with 1%  lidocaine with epinephrine.  After adequate analgesia had been obtained  and the patient had been given 4 mg of IV morphine, a Tuohy needle was  inserted until the pop of the ligament was felt.  Clear CSF was  immediately expressed out and a catheter was advanced approximately 10  cm and then the Tuohy needle removed and the catheter was sewn in place  and hooked up to a collection system.  This was kept clamped until  arrival and the step-down will maintain CSF drainage approximately 80 to  120 mL a shift or 10 to 15 an hour.           ______________________________  Donalee Citrin, M.D.     GC/MEDQ  D:  01/06/2007  T:  01/07/2007  Job:  [id number]

## 2010-12-22 NOTE — Op Note (Signed)
NAME:  JAMEAL, RAZZANO NO.:  [id number]   MEDICAL RECORD NO.:  [id number]          PATIENT TYPE:  INP   LOCATION:  9811                         FACILITY:  [facility]   PHYSICIAN:  Reinaldo Meeker, M.D. DATE OF BIRTH:  [birth year]   DATE OF PROCEDURE:  01/19/2007  DATE OF DISCHARGE:                               OPERATIVE REPORT   PREOPERATIVE DIAGNOSIS:  Lumbar cerebrospinal fluid leak.   POSTOPERATIVE DIAGNOSIS:  Lumbar cerebrospinal fluid leak.   PROCEDURE:  Exploration of lumbar wound with repair of dural defect.   SURGEON:  Reinaldo Meeker, MD   ASSISTANT:  Tia Alert, MD   PROCEDURE IN DETAIL:  After being placed in the prone position, the  patient's back was prepped and draped in the usual sterile fashion.  The  previous lumbar incision was open and obvious CSF collection under  pressure was encountered.  The wound was finger-dissected free and a  McCullough self-retaining retractor was placed for exposure.  At the  inferior edge of the laminotomy, obvious dural defect could be noted  where there was an edge of bone where the lamina and the spinous process  intersected.  This spicule of bone had lacerated the dura in a linear  fashion.  The laminotomy was therefore enlarged to get circumferentially  around this dural defect.  It was then repaired with 3 or 4 interrupted  Prolene stitches.  Gelfoam was then placed over it to a get hemostasis  and the Gelfoam was then removed.  Large amounts irrigation were carried  out.  Tisseel was used to seal over the repair and Gelfoam was placed on  top of the Tisseel.  The wound was then closed in multiple layers of  Vicryl on the muscle, fascia, subcutaneous and subcuticular tissues and  Dermabond was placed on the skin.  A sterile dressing was then applied  and the patient was extubated and take to the recovery room in stable  condition.           ______________________________  Reinaldo Meeker, M.D.     ROK/MEDQ  D:  01/19/2007  T:  01/20/2007  Job:  [id number]

## 2010-12-22 NOTE — H&P (Signed)
NAME:  Hector House, Hector House NO.:  [id number]   MEDICAL RECORD NO.:  [id number]          PATIENT TYPE:  INP   LOCATION:  3114                         FACILITY:  [facility]   PHYSICIAN:  Donalee Citrin, M.D.        DATE OF BIRTH:  Feb 09, 1946   DATE OF ADMISSION:  01/06/2007  DATE OF DISCHARGE:                              HISTORY & PHYSICAL   REASON FOR ADMISSION:  CSF leak.   HISTORY OF PRESENT ILLNESS:  The patient is a 65 year old gentleman who  underwent lumbar laminectomy and presumptive microdiskectomy back on the  6 May.  Postoperatively the patient did well.  Pain in his leg initially  improved, however, over the weeks following postop he developed some  swelling around his incision, some clear fluid drainage that was  initially treated as a possible superficial wound infection as no spinal  fluid was seen at the time of surgery.  The patient continued to have  headaches, went to the emergency room on Wednesday, was evaluated,  underwent a head CT and evaluation of back pain.  Dr. Gerlene Fee was  notified and was given an appointment on Friday morning.  In the office  on Friday morning was noted to have clear fluid drainage out of his  incision with a postural headache and was transferred to [facility] for  admission and lumbar drain placement.   PAST MEDICAL HISTORY:  Relatively unremarkable.   PAST SURGICAL HISTORY:  Includes laminectomy and left arm surgery from  trauma as a 65 year old.   SOCIAL HISTORY:  Smokes one pack-a-day of cigarettes.   MEDICATION ALLERGIES:  None.   CURRENT MEDICATIONS:  Aleve p.r.n. for pain.   PHYSICAL EXAMINATION:  GENERAL:  This patient is very pleasant, awake,  and alert.  NEURO EXAM:  In no acute distress.  HEENT:  Within normal limits.  EXTREMITIES:  Upper extremity strength is 5/5.  Lower extremity strength  is 5/5 in his iliac, psoas, quads, hamstrings, gastroc, and EHL.  BACK:  His lumbar incision has a fair amount of swelling  and clear fluid  leaking out of the inferior aspect of the incision and so patient will  be taking to neuro holding and lumbar drain placed.   ASSESSMENT AND PLAN:  The lumbar drain will maintain 10 to 15 mL of  output adjusted by height in the intermediate care unit for 4 or 5 days  to try to seal the CSF leak.           ______________________________  Donalee Citrin, M.D.     GC/MEDQ  D:  01/06/2007  T:  01/07/2007  Job:  [id number]

## 2010-12-22 NOTE — Discharge Summary (Signed)
NAME:  Hector House, LORIA NO.:  [id number]   MEDICAL RECORD NO.:  [id number]          PATIENT TYPE:  INP   LOCATION:  3022                         FACILITY:  [facility]   PHYSICIAN:  Donalee Citrin, M.D.        DATE OF BIRTH:  08-29-1945   DATE OF ADMISSION:  01/06/2007  DATE OF DISCHARGE:  01/12/2007                               DISCHARGE SUMMARY   DISCHARGE DIAGNOSES:  Cerebrospinal fluid leak and pseudomeningocele.   PROCEDURE:  Lumbar drain placement.   HISTORY OF PRESENT ILLNESS:  Patient is a 65 year old gentleman who  presented as an outpatient to the office with swelling around the  incision and clear fluid leaking around his back.  Patient was brought  over to the hospital.  Had a lumbar drain placed in the holding area.  He was maintained on the floor initially in the cardiac step-down area;  however, it is very difficult to manage the level of the output.  The  patient was transferred from the ICU to the neuro ICU, where consistent  outputs anywhere from 10-20 cc were maintained for the next five days.   The swelling went down.  The wound became flat.  The patient's pain was  controlled on p.o. pain medication.  The drain was then taken out on  hospital day #5.  There was no evidence of further leak on ambulation.  The patient was able to be discharged home, per Dr. Gerlene Fee, on hospital  day #6.           ______________________________  Donalee Citrin, M.D.     GC/MEDQ  D:  02/23/2007  T:  02/23/2007  Job:  [id number]

## 2010-12-25 NOTE — Op Note (Signed)
NAME:  Hector House, Hector House                  ACCOUNT NO.:  [id number]   MEDICAL RECORD NO.:  [id number]          PATIENT TYPE:  AMB   LOCATION:  SDS                          FACILITY:  [facility]   PHYSICIAN:  Reinaldo Meeker, M.D. 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   PROCEDURE:  Left L4-5 interlaminar laminotomy for excision of hernia  disk with operating microscope.   SECONDARY PROCEDURE:  Microsection L4-5 disk and L5 nerve root.   SURGEON:  Reinaldo Meeker, M.D.   ASSISTANT:  Julio Sicks, M.D.   PROCEDURE IN DETAIL:  After being placed in the prone position, the  patient's back was prepped and draped in usual sterile fashion.  Localizing x-ray was taken prior to incision to identify the appropriate  level.  Midline incision made over the spinous processes of L4-L5.  Using Bovie cutting current  the incision was carried down to the  spinous processes.  Subperiosteal dissection was then carried out on the  left-sided spinous processes and lamina.  Self-retaining retractor was  placed for exposure.  Second x-ray showed approach the appropriate  level.  Using the high-speed drill the inferior one half of the L4  lamina, medial one third of the facet joint and superior one third of  the L5 lamina were removed.  Residual bone and ligamentum flavum were  removed in piecemeal fashion.  Some additional bone towards the neural  foramen was removed to allow access for disk removal.  At this time  microscope was draped, brought on the field, used for remainder of the  case.  Using microsection technique the lateral aspect thecal sac and L5  nerve were identified.  Further coagulation was carried out down floor  the canal to identify the L4-5 disk was found remarkably herniated with  extension in the  foramen.  After coagulating the annulus, the annulus  was incised with 15 blade.  Using pituitary rongeurs and curettes  thorough disk space clean-out was carried out while same time great care  was taken to avoid injury to the neural elements.  Additional disk  removal towards the foramen was carried out as well.  At this time,  inspection was carried out all directions for any evidence of residual  compression and none could be identified.  Large amounts irrigation  carried out.  Any bleeding controlled bipolar coagulation and Gelfoam.  Wound was then closed in multiple layers of Vicryl the muscle, fascia,  subcutaneous and subcuticular tissues.  Staples were placed on the skin.  Sterile dressings then applied.  The patient was extubated, taken to  recovery room in stable condition.           ______________________________  Reinaldo Meeker, M.D.     ROK/MEDQ  D:  12/13/2006  T:  12/13/2006  Job:  [id number]

## 2011-01-19 ENCOUNTER — Encounter: Payer: Self-pay | Admitting: Sports Medicine

## 2011-01-20 ENCOUNTER — Telehealth: Payer: Self-pay | Admitting: Pulmonary Disease

## 2011-01-20 NOTE — Telephone Encounter (Signed)
Spoke with Pharmacist at [facility] and advised that rxs can not be filled until he schedules an appt with [facility]- he was last seen here Feb 2011. Pharmacist verbalized understanding.

## 2011-02-15 ENCOUNTER — Encounter: Payer: Self-pay | Admitting: Pulmonary Disease

## 2011-02-16 ENCOUNTER — Encounter: Payer: Self-pay | Admitting: Pulmonary Disease

## 2011-02-16 ENCOUNTER — Ambulatory Visit (INDEPENDENT_AMBULATORY_CARE_PROVIDER_SITE_OTHER): Payer: Medicare Other | Admitting: Pulmonary Disease

## 2011-02-16 VITALS — BP 102/64 | HR 94 | Temp 98.3°F | Ht 71.0 in | Wt 116.0 lb

## 2011-02-16 DIAGNOSIS — J438 Other emphysema: Secondary | ICD-10-CM

## 2011-02-16 NOTE — Progress Notes (Signed)
  Subjective:    Patient ID: Hector Octave., male    DOB: 1946-06-02, 65 y.o.   MRN: [id number]  HPI The pt comes in today for f/u of his known emphysema.  He is compliant with his neb treatments, but unfortunately continues to smoke.  He has decreased by 50%.  He has chronic doe, and feels he is a little worse currently due to high heat.  +chronic cough with various colors of mucus, primarily in am upon arising.  Does not feel he has a chest cold.  Review of Systems  Constitutional: Negative for fever and unexpected weight change.  HENT: Positive for dental problem. Negative for ear pain, nosebleeds, congestion, sore throat, rhinorrhea, sneezing, trouble swallowing, postnasal drip and sinus pressure.   Eyes: Positive for itching. Negative for redness.  Respiratory: Positive for cough, chest tightness, shortness of breath and wheezing.   Cardiovascular: Negative for palpitations and leg swelling.  Gastrointestinal: Negative for nausea and vomiting.  Genitourinary: Negative for dysuria.  Musculoskeletal: Negative for joint swelling.  Skin: Negative for rash.  Neurological: Negative for headaches.  Hematological: Does not bruise/bleed easily.  Psychiatric/Behavioral: Positive for dysphoric mood. The patient is not nervous/anxious.        Objective:   Physical Exam Thin male in nad Chest with decreased bs , no wheezing. Cor with rrr LE without edema, no cyanosis  Alert and oriented, moves all 4        Assessment & Plan:

## 2011-02-16 NOTE — Patient Instructions (Signed)
No change in meds Try to stay as active as possible Work on complete smoking cessation. followup with me in 6mos

## 2011-02-16 NOTE — Assessment & Plan Note (Signed)
The pt is essentially stable from last visit, but needs to quit smoking.  He is taking his nebs compliantly, and is decreasing his quantity of smoking.  I have stressed to him the importance of quitting entirely.  Will continue on current meds, and have asked him to stay as active as possible.

## 2011-03-01 ENCOUNTER — Telehealth: Payer: Self-pay | Admitting: Pulmonary Disease

## 2011-03-01 NOTE — Telephone Encounter (Signed)
Spoke with Hector House verbal for month supply with 5 refills.

## 2011-03-26 ENCOUNTER — Ambulatory Visit
Admission: RE | Admit: 2011-03-26 | Discharge: 2011-03-26 | Disposition: A | Discharge status: Home / Self Care | Payer: Medicare Other | Source: Ambulatory Visit | Attending: Radiation Oncology | Admitting: Radiation Oncology

## 2011-03-31 ENCOUNTER — Telehealth: Payer: Self-pay | Admitting: Pulmonary Disease

## 2011-03-31 NOTE — Telephone Encounter (Signed)
Alida, have you seen this from [employer]?

## 2011-04-02 NOTE — Telephone Encounter (Signed)
I do not see this form Christoper Allegra will have to refax this form will call to inform .Kandice Hams

## 2011-05-27 LAB — LIPASE, BLOOD: Lipase: 17

## 2011-05-27 LAB — COMPREHENSIVE METABOLIC PANEL
ALT: 17
AST: 11
CO2: 32
Chloride: 100
Creatinine, Ser: 0.79
GFR calc Af Amer: >60
GFR calc non Af Amer: >60
Glucose, Bld: 102 — ABNORMAL HIGH
Sodium: 140
Total Bilirubin: 0.9

## 2011-05-27 LAB — CBC
MCV: 94.9
Platelets: 251
RBC: 4.38
WBC: 10

## 2011-08-19 ENCOUNTER — Ambulatory Visit (INDEPENDENT_AMBULATORY_CARE_PROVIDER_SITE_OTHER)
Admission: RE | Admit: 2011-08-19 | Discharge: 2011-08-19 | Disposition: A | Discharge status: Home / Self Care | Payer: Medicare Other | Source: Ambulatory Visit | Attending: Pulmonary Disease | Admitting: Pulmonary Disease

## 2011-08-19 ENCOUNTER — Encounter: Payer: Self-pay | Admitting: Pulmonary Disease

## 2011-08-19 ENCOUNTER — Ambulatory Visit (INDEPENDENT_AMBULATORY_CARE_PROVIDER_SITE_OTHER): Payer: Medicare Other | Admitting: Pulmonary Disease

## 2011-08-19 ENCOUNTER — Ambulatory Visit: Payer: Medicare Other | Admitting: Pulmonary Disease

## 2011-08-19 DIAGNOSIS — J209 Acute bronchitis, unspecified: Secondary | ICD-10-CM

## 2011-08-19 DIAGNOSIS — J438 Other emphysema: Secondary | ICD-10-CM

## 2011-08-19 MED ORDER — CEFDINIR 300 MG PO CAPS: 600.0000 mg | ORAL_CAPSULE | Freq: Every day | ORAL | Status: AC

## 2011-08-19 NOTE — Patient Instructions (Signed)
Will check cxr today and call you with the results. Continue your nebulized medications You need to increase your caloric intake.  Try protein shakes or milkshakes twice a day omnicef for your bronchitis as directed for 5 days You need to continue to try and stop smoking 100% Consider referral to pulmonary exercise program at a local hospital.  followup with me in 6mos, or sooner if having issues.

## 2011-08-19 NOTE — Progress Notes (Signed)
  Subjective:    Patient ID: Hector House., male    DOB: [birth year], 66 y.o.   MRN: [id number]  HPI Patient comes in today for followup of his known severe emphysema.  He has cut way back on his cigarette smoking, but I have encouraged him to quit 100%.  He continues to have significant dyspnea on exertion, but is at his usual baseline.  He is having a lot of congestion and cough of purulent mucus.  He denies any fevers, chills, or sweats.  He is continuing to lose weight, and the patient states that he is trying to increase his food intake.   Review of Systems  Constitutional: Negative for fever and unexpected weight change.  HENT: Positive for congestion. Negative for ear pain, nosebleeds, sore throat, rhinorrhea, sneezing, trouble swallowing, dental problem, postnasal drip and sinus pressure.   Eyes: Negative for redness and itching.  Respiratory: Positive for cough and shortness of breath. Negative for chest tightness and wheezing.   Cardiovascular: Negative for palpitations and leg swelling.  Gastrointestinal: Negative for nausea and vomiting.  Genitourinary: Negative for dysuria.  Musculoskeletal: Negative for joint swelling.  Skin: Negative for rash.  Neurological: Negative for headaches.  Hematological: Does not bruise/bleed easily.  Psychiatric/Behavioral: Negative for dysphoric mood. The patient is not nervous/anxious.        Objective:   Physical Exam Cachectic male in no acute distress Nose without purulent discharge noted Chest with diffuse decrease in breath sounds, no wheezing or rhonchi Heart exam with regular rate and rhythm Lower extremities without edema, no cyanosis noted Alert and oriented, moves all 4 extremities.       Assessment & Plan:

## 2011-08-19 NOTE — Assessment & Plan Note (Signed)
The pt will need a short course of abx to get him thru this.

## 2011-08-19 NOTE — Assessment & Plan Note (Signed)
The pt has severe disease, and although getting by, he has failure to thrive.  He is losing weight probably due to his lung disease and wob, but will check cxr today for completeness.  He is limiting his ADL's because of his sob.  He is on a good BD regimen, and has greatly reduced his cigs.  I have asked him to really work on total smoking cessation.  Will continue current meds, and refer him to pulm rehab.

## 2011-09-16 ENCOUNTER — Other Ambulatory Visit: Payer: Self-pay | Admitting: Sports Medicine

## 2011-09-16 NOTE — Telephone Encounter (Signed)
Refill request

## 2011-09-17 ENCOUNTER — Other Ambulatory Visit: Payer: Self-pay | Admitting: Radiation Oncology

## 2011-09-17 ENCOUNTER — Encounter: Payer: Self-pay | Admitting: Radiation Oncology

## 2011-09-17 ENCOUNTER — Ambulatory Visit
Admission: RE | Admit: 2011-09-17 | Discharge: 2011-09-17 | Disposition: A | Discharge status: Home / Self Care | Payer: Medicare Other | Source: Ambulatory Visit | Attending: Radiation Oncology | Admitting: Radiation Oncology

## 2011-09-17 VITALS — BP 97/67 | HR 78 | Temp 98.6°F | Resp 18 | Wt 111.9 lb

## 2011-09-17 DIAGNOSIS — C4920 Malignant neoplasm of connective and soft tissue of unspecified lower limb, including hip: Secondary | ICD-10-CM

## 2011-09-17 HISTORY — DX: Personal history of irradiation: Z92.3

## 2011-09-17 NOTE — Progress Notes (Signed)
[facility] Radiation Oncology Follow up Note  Name: Hector House.   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 He reports that he is still struggling with smoking cessation and drinking alcohol but he has cut back on both. He has continued to lose weight. When I saw him in August she weighed 114 pounds. Today he is 111.9 pounds. This is in spite of increasing his protein and calorie intake. The patient has been followed by Dr.Clance for this, he recently had a chest x-ray which showed emphysema but no new changes. Dr. Shelle Iron believes that this is failure to thrive largely due to his emphysema and increased work of breathing.    ALLERGIES: Review of patient's allergies indicates no known allergies.   MEDICATIONS:  Current Outpatient Prescriptions  Medication Sig Dispense Refill  . dextromethorphan-guaiFENesin (MUCINEX DM) 30-600 MG per 12 hr tablet Take 1 tablet by mouth every 12 (twelve) hours.      Marland Kitchen albuterol (PROVENTIL HFA) 108 (90 BASE) MCG/ACT inhaler Inhale 2 puffs into the lungs as directed. 2 puffs every 4-6 hours as needed       . aspirin 81 MG tablet Take 81 mg by mouth daily.       . budesonide (PULMICORT) 0.25 MG/2ML nebulizer solution Take 0.25 mg by nebulization 4 (four) times daily.        . carbamide peroxide (DEBROX) 6.5 % otic solution Place 10 drops into the left ear 2 (two) times daily. For 4 days       . ipratropium-albuterol (DUONEB) 0.5-2.5 (3) MG/3ML SOLN Take 3 mLs by nebulization 4 (four) times daily. Can use an additional 2 treatments if needed.      Marland Kitchen lisinopril-hydrochlorothiazide (PRINZIDE,ZESTORETIC) 20-25 MG per tablet TAKE 1 TABLET EVERY DAY  90 tablet  3         PHYSICAL EXAM:   weight is 111 lb 14.4 oz  (50.758 kg). His oral temperature is 98.6 F (37 C). His blood pressure is 97/67 and his pulse is 78. His respiration is 18.  he is in no acute distress. He is cachectic. He is pleasant to speak with, alert and oriented. His right thigh demonstrates a well-healed surgical scar. There is some mild erythema around the scar. The skin is intact. There are no palpable nodules over his thigh.   LABORATORY DATA:  Lab Results  Component Value Date   WBC 12.1* 07/10/2010   HGB 16.6 07/10/2010   HCT 47.2 07/10/2010   MCV 95.8 07/10/2010   PLT 235 07/10/2010   Lab Results  Component Value Date   NA 142 08/14/2010   K 3.7 08/14/2010   CL 107 08/14/2010   CO2 29 08/14/2010   Lab Results  Component Value Date   ALT 22 07/10/2010   AST 23 07/10/2010   ALKPHOS 69 07/10/2010   BILITOT 0.8 07/10/2010       RADIOGRAPHIC STUDIES:  Recent chest x-ray was negative   IMPRESSION/ PLAN: He is without evidence of disease from the standpoint of his sarcoma   With respect to his weight loss, , I reviewed the patient's pathology results from September 2011. As stated in the pathology report, "Per clinician's request the patient's slides and blocks were sent to  Dr. Marcene Brawn at the [facility]. Dr. 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 In his opinion, the depth of invasion is minimal and he considers the risk of metastases to be exceedingly small." As I recall, this was not felt to be very aggressive appearing sarcoma and it was quite superficial.  I think them likelihood that his weight loss is due to this superficial sarcoma is very low. He is without evidence of disease from that standpoint. I would be more concerned about indolent cancer from his smoking, such as lung cancer, though his chest x-ray was negative. I think Dr.Clance's theory about increased work of breathing/ emphysema and consequent weight loss is reasonable and I will continue to defer  to him on managing the patient's failure to thrive.  I will see the patient back in one year for his followup. He is seeing Dr. Luisa Hart of surgery next week for followup as well.

## 2011-09-17 NOTE — Progress Notes (Signed)
CONCERNED ABOUT CONTINUOUS WEIGHT LOSS.  SAYS HE IS TAKING PROTEIN SUPPLEMENTS, COOKING AND EATING BETTER.  NOT DRINKING AS MUCH ETOH AS BEFORE.  ALSO SAYS HE HAS BACK PAIN THAT PREVENTS HIM FROM DOING MUCH ACTIVITY, HAS HAD 3 BACK SURGERIES.  AREA ON LEG LOOKS HEALED, PINK IN COLOR, SAYS IT ITCHES SOME

## 2011-09-20 ENCOUNTER — Encounter (INDEPENDENT_AMBULATORY_CARE_PROVIDER_SITE_OTHER): Payer: Self-pay | Admitting: Surgery

## 2011-09-20 ENCOUNTER — Other Ambulatory Visit (INDEPENDENT_AMBULATORY_CARE_PROVIDER_SITE_OTHER): Payer: Self-pay | Admitting: General Surgery

## 2011-09-20 ENCOUNTER — Ambulatory Visit (INDEPENDENT_AMBULATORY_CARE_PROVIDER_SITE_OTHER): Payer: Medicare Other | Admitting: Surgery

## 2011-09-20 VITALS — BP 138/82 | HR 78 | Resp 18 | Ht 71.0 in | Wt 113.0 lb

## 2011-09-20 DIAGNOSIS — Z85831 Personal history of malignant neoplasm of soft tissue: Secondary | ICD-10-CM | POA: Insufficient documentation

## 2011-09-20 DIAGNOSIS — Z8589 Personal history of malignant neoplasm of other organs and systems: Secondary | ICD-10-CM

## 2011-09-20 NOTE — Patient Instructions (Signed)
Will arrange for PET scan to follow up sarcoma.

## 2011-09-20 NOTE — Progress Notes (Signed)
Subjective:     Patient ID: Hector Octave., male   DOB: 1946/05/18, 66 y.o.   MRN: [id number]  HPI The patient returns for 12 month followup after excision of right thigh review mild sarcoma. He has completed radiation therapy. He also underwent biopsy of right femoral lymph node. He has severe COPD and is quite physically debilitated. He does have chronic pain and continues to smoke.     Review of Systems  Constitutional: Positive for activity change, appetite change, fatigue and unexpected weight change.  HENT: Negative.   Eyes: Negative.   Respiratory: Positive for cough and shortness of breath.   Cardiovascular: Negative.   Gastrointestinal: Negative.   Musculoskeletal: Positive for back pain and arthralgias.  Neurological: Negative.   Hematological: Negative.   Psychiatric/Behavioral: Negative.      Physical Exam  Constitutional: He is oriented to person, place, and time.  HENT:  Head: Normocephalic and atraumatic.  Eyes: EOM are normal. Pupils are equal, round, and reactive to light.  Neck: Normal range of motion. Neck supple.  Abdominal: Soft. Bowel sounds are normal.  Musculoskeletal:       Right thigh scar well healed. No mass. Bilateral femoral mildly enlarged lymph nodes.  Neurological: He is alert and oriented to person, place, and time.  Skin: Skin is warm and dry.  Psychiatric: His behavior is normal. Judgment and thought content normal. His mood appears anxious. Cognition and memory are normal.       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  Severe COPD    Plan:       He is losing weight secondary to his severe COPD. He is trying to stop smoking his increase his protein intake. He has persistent groin lymphadenopathy which was present during his initial resection and this was reactive. I think it is worthwhile to consider a PET scan given his history of sarcoma and back pain with groin  lymphadenopathy. If this is negative  I will see him back in 6 months

## 2011-09-24 ENCOUNTER — Ambulatory Visit: Payer: Medicare Other | Admitting: Radiation Oncology

## 2011-09-29 ENCOUNTER — Encounter (HOSPITAL_COMMUNITY)
Admission: RE | Admit: 2011-09-29 | Discharge: 2011-09-29 | Disposition: A | Discharge status: Home / Self Care | Payer: Medicare Other | Source: Ambulatory Visit | Attending: Surgery | Admitting: Surgery

## 2011-09-29 DIAGNOSIS — Z923 Personal history of irradiation: Secondary | ICD-10-CM | POA: Insufficient documentation

## 2011-09-29 DIAGNOSIS — I6529 Occlusion and stenosis of unspecified carotid artery: Secondary | ICD-10-CM | POA: Insufficient documentation

## 2011-09-29 DIAGNOSIS — R599 Enlarged lymph nodes, unspecified: Secondary | ICD-10-CM | POA: Insufficient documentation

## 2011-09-29 DIAGNOSIS — D1779 Benign lipomatous neoplasm of other sites: Secondary | ICD-10-CM | POA: Insufficient documentation

## 2011-09-29 DIAGNOSIS — C4920 Malignant neoplasm of connective and soft tissue of unspecified lower limb, including hip: Secondary | ICD-10-CM | POA: Insufficient documentation

## 2011-09-29 DIAGNOSIS — R634 Abnormal weight loss: Secondary | ICD-10-CM | POA: Insufficient documentation

## 2011-09-29 DIAGNOSIS — Z85831 Personal history of malignant neoplasm of soft tissue: Secondary | ICD-10-CM

## 2011-09-29 DIAGNOSIS — J438 Other emphysema: Secondary | ICD-10-CM | POA: Insufficient documentation

## 2011-09-29 LAB — GLUCOSE, CAPILLARY: Glucose-Capillary: 100 mg/dL — ABNORMAL HIGH (ref 70–99)

## 2011-09-29 MED ORDER — FLUDEOXYGLUCOSE F - 18 (FDG) INJECTION
19.6000 | Freq: Once | INTRAVENOUS | Status: AC | PRN
  Administered 2011-09-29: 19.6 via INTRAVENOUS

## 2011-10-11 ENCOUNTER — Encounter: Payer: Self-pay | Admitting: Family Medicine

## 2011-10-11 ENCOUNTER — Ambulatory Visit (INDEPENDENT_AMBULATORY_CARE_PROVIDER_SITE_OTHER): Payer: Medicare Other | Admitting: Family Medicine

## 2011-10-11 VITALS — BP 90/68 | HR 88 | Temp 97.9°F | Ht 71.0 in | Wt 113.0 lb

## 2011-10-11 DIAGNOSIS — J438 Other emphysema: Secondary | ICD-10-CM

## 2011-10-11 DIAGNOSIS — Z23 Encounter for immunization: Secondary | ICD-10-CM

## 2011-10-11 NOTE — Progress Notes (Signed)
  Subjective:    Patient ID: Hector House., male    DOB: 1946/05/28, 66 y.o.   MRN: [id number]  HPI  Interviewed and examined patient with Dareen Piano, MS3. Agree with documentation.  CC: refill of albuterol  HPI:  History of emphysema, requests refill for albuterol.  Saw his pulmonologist last month, did not have any medication changes per patient.  Continues to take budesonide, duonebs, and albuterol prn.  Has cut down smoking, has a pocket full of cigarettes- unclear how many he smokes per day, notes he has started using e-cigs to help cut back.  Notes chronic daily dyspnea and sputum, at baseline.  Notes gradual decreasing exercise tolerance, but no symptoms of acute bronchitis.  Gradual weight loss.  Review of SystemsNo fever, chills.  See HPI     Objective:   Physical Exam  GEN: Alert & Oriented, No acute distress, thin, chronically ill appearing CV:  Regular Rate & Rhythm, no murmur Respiratory:  Normal work of breathing, decreased breath sounds      Assessment & Plan:

## 2011-10-11 NOTE — Progress Notes (Signed)
  Subjective:    Patient ID: Hector Octave., male    DOB: 1946/08/08, 66 y.o.   MRN: [id number]  HPI Hector House is a 66 yo M with PMHx of back pain and htn who presents to PCP for management of emphysema.  Pt reports that earlier today he was unable to refill his albuterol inhaler at his pharmacy.  He reports that sometimes he feels lightheaded, but this is usually early in the morning immediately following rising from bed.  He reports that he uses his nebulizer treatments frequently and they work well for him.  Denies hemoptysis, dysphagia, weight loss, fevers, CP, palpitations.  Pt thinks that his emphysema is stable and has not worsened since he was seen in clinic by Dr. Karie Schwalbe one year ago.  Pt reports that he still smokes about 1/2 ppd (down from 2 ppd).  He reports that he decreased his smoking 4-5 months ago because he was having difficulties breathing.  Pt saw his pulmonologist last month.  Last PFTs in 2011.  Review of Systems  All other systems reviewed and are negative.       Objective:   Physical Exam  Constitutional:       Very thin, appears older than stated age  Eyes: Conjunctivae and EOM are normal. Pupils are equal, round, and reactive to light.  Neck: Normal range of motion. Neck supple.  Cardiovascular: Normal rate, regular rhythm and normal heart sounds.  Exam reveals no gallop and no friction rub.   No murmur heard. Pulmonary/Chest: Effort normal. No respiratory distress. He has no rales. He exhibits no tenderness.       Diffuse expiratory wheezes; prolonged expiratory phase  Lymphadenopathy:    He has no cervical adenopathy.  Skin: Skin is warm and dry.   Pt was given Tdap, Pneumo, and flu vaccines today.       Assessment & Plan:

## 2011-10-11 NOTE — Patient Instructions (Signed)
Come back to meet your Dr.; Dr. Rivka Safer in 3-4 weeks to talk about pain

## 2011-10-11 NOTE — Assessment & Plan Note (Signed)
Advised patient to continue meds as rxed by pulmonology, encouraged him on cutting back on cigarettes and encouraged continued cessation.    Administered flu, pneumovax, and tdap vaccinations today.  Advised follow-up with PCP for chronic medical conditions

## 2011-10-12 MED ORDER — PNEUMOCOCCAL VAC POLYVALENT 25 MCG/0.5ML IJ INJ: 0.5000 mL | INJECTION | Freq: Once | INTRAMUSCULAR | Status: DC

## 2011-10-12 NOTE — Progress Notes (Signed)
Addended by: Deno Etienne on: 10/12/2011 05:08 PM   Modules accepted: Orders

## 2011-12-27 ENCOUNTER — Telehealth: Payer: Self-pay | Admitting: Pulmonary Disease

## 2011-12-27 NOTE — Telephone Encounter (Signed)
No need for message. °

## 2012-03-14 ENCOUNTER — Telehealth: Payer: Self-pay | Admitting: Pulmonary Disease

## 2012-03-14 MED ORDER — IPRATROPIUM-ALBUTEROL 0.5-2.5 (3) MG/3ML IN SOLN: 3.0000 mL | Freq: Four times a day (QID) | RESPIRATORY_TRACT | Status: DC

## 2012-03-14 MED ORDER — BUDESONIDE 0.25 MG/2ML IN SUSP: 0.2500 mg | Freq: Two times a day (BID) | RESPIRATORY_TRACT | Status: DC

## 2012-03-14 NOTE — Telephone Encounter (Signed)
I called [facility] and gave verbal order for pulmicort and duoneb. Pt has medicare and will only pay for the budesonide 1 vial BID. Pt aware to keep his appt for this month. Nothing further was needed

## 2012-03-16 ENCOUNTER — Encounter (INDEPENDENT_AMBULATORY_CARE_PROVIDER_SITE_OTHER): Payer: Self-pay | Admitting: Surgery

## 2012-03-16 ENCOUNTER — Ambulatory Visit (INDEPENDENT_AMBULATORY_CARE_PROVIDER_SITE_OTHER): Payer: Medicare Other | Admitting: Surgery

## 2012-03-16 VITALS — BP 114/66 | HR 68 | Temp 99.0°F | Resp 16 | Ht 71.0 in | Wt 111.2 lb

## 2012-03-16 DIAGNOSIS — Z85830 Personal history of malignant neoplasm of bone: Secondary | ICD-10-CM

## 2012-03-16 DIAGNOSIS — Z85831 Personal history of malignant neoplasm of soft tissue: Secondary | ICD-10-CM

## 2012-03-16 NOTE — Patient Instructions (Addendum)
Return 1 year. 

## 2012-03-16 NOTE — Progress Notes (Signed)
Subjective:     Patient ID: Hector House., male   DOB: 1945/12/22, 66 y.o.   MRN: [id number]  HPI The patient returns for 12 month followup after excision of right thigh sarcoma 2011. He has completed radiation therapy. He also underwent biopsy of right femoral lymph node. He has severe COPD and is quite physically debilitated. He does have chronic pain and continues to smoke.     Review of Systems  Constitutional: Positive for activity change, appetite change, fatigue and unexpected weight change.  HENT: Negative.   Eyes: Negative.   Respiratory: Positive for cough and shortness of breath.   Cardiovascular: Negative.   Gastrointestinal: Negative.   Musculoskeletal: Positive for back pain and arthralgias.  Neurological: Negative.   Hematological: Negative.   Psychiatric/Behavioral: Negative.      Physical Exam  Constitutional: He is oriented to person, place, and time.  HENT:  Head: Normocephalic and atraumatic.  Eyes: EOM are normal. Pupils are equal, round, and reactive to light.  Neck: Normal range of motion. Neck supple.  Abdominal: Soft. Bowel sounds are normal.  Musculoskeletal:       Right thigh scar well healed. No mass. Bilateral femoral mildly enlarged lymph nodes.  Neurological: He is alert and oriented to person, place, and time.  Skin: Skin is warm and dry.  Psychiatric: His behavior is normal. Judgment and thought content normal. His mood appears anxious. Cognition and memory are normal.    NM PET Image Initial (PI) Skull Base To Thigh   Status: Final result       PACS Images     Show images for NM PET Image Initial (PI) Skull Base To Thigh      Study Result     *RADIOLOGY REPORT*   Clinical Data: Initial treatment strategy for right thigh sarcoma. Wide excision with postop radiation therapy in 2012.  Recent weight loss.  Persistent groin lymphadenopathy.   NUCLEAR MEDICINE PET CT SKULL BASE TO THIGH   Technique:  19.6 mCi F-18 FDG was injected  intravenously via the right AC.  Full-ring PET imaging was performed from the skull base through the mid-thighs 77  minutes after injection.  CT data was obtained and used for attenuation correction and anatomic localization only.  (This was not acquired as a diagnostic CT examination.)   Fasting Blood Glucose:  100   Patient Weight:  113 pounds.   Comparison: Plain film chest of 08/18/1936.  Chest CT of 07/13/2010.   Findings: PET images demonstrate no abnormal activity within the neck, chest, abdomen, or pelvis.  Minimal low level hypermetabolism about the adductor musculature of the right thigh is likely postoperative.   CT images performed for attenuation correction demonstrate age advanced carotid atherosclerosis.   Moderate to severe centrilobular emphysema with biapical pleural parenchymal scarring. Lower lobe predominant bronchial thickening which is unchanged. Similar scarring in the posterior right upper lobe, including on image 96.   Bilateral adrenal thickening which is similar and likely related to hyperplasia.  Not hypermetabolic.  No inguinal adenopathy.  Lipoma within the right vastus medialis.  1.9 cm.  Mild osteopenia.   IMPRESSION:   1.  No evidence of hypermetabolic recurrent or residual disease. 2.  Moderate to severe centrilobular emphysema.   Original Report Authenticated By: Consuello Bossier, M.D.      Result Notes     Notes Recorded by Lannie Fields, RN on 09/30/2011 at 10:58 AM PT NOTIFIED OF PET RESULTS/ OK PER DR. Luisa Hart  External Result Report          External Result Report       Imaging     Imaging Information       Signed by       Signed Date/Time   Phone Pager    Consuello Bossier 09/29/2011  2:13 PM EDT [phone]      Exam Information       Status Exam Begun   Exam Ended      Final [99] 09/29/2011  9:10 AM EDT 09/29/2011 11:59 AM EDT      Result Notes     Notes Recorded by Lannie Fields, RN on  09/30/2011 at 10:58 AM PT NOTIFIED OF PET RESULTS/ OK PER DR. Luisa Hart        Imaging Related Medications          fludeoxyglucose F - 18 (FDG) injection 19.6 milli Curie 19.6 milli Curie Once PRN 09/29/2011 0920 09/29/2011 0920    Route:   Intravenous        Admin Dose:   19.6 milli Curie        PRN Comment:   PP        Last Admin Time:   09/29/11 0920        Number of Doses:   1       Original Order       Ordered On Ordered By      Mon Sep 20, 2011 3:06 PM Lannie Fields, RN                NM PET Image Initial (PI) Skull Base To Thigh (Order [id number])  Imaging  : [id number]   Authorizing: Clovis Pu. Rayansh Herbst, MD  Department: [facility]   Date: 09/29/2011  Released By: Rolly Salter        Order Information       Order Date/Time Release Date/Time Start Date/Time End Date/Time    09/29/2011  8:51 AM 09/29/2011  8:51 AM 09/29/2011 None              Order Details       Frequency Duration Priority Order Class    As needed 1  occurrence Routine Ancillary Performed              Imaging CC Recipients       Associated Diagnoses     History of sarcoma                Collection Information       Resulting Agency    [facility]              Reviewed by List     Edd Arbour   on Thu Sep 30, 2011  9:01 AM      Lannie Fields   on Thu Sep 30, 2011 10:58 AM      Dortha Schwalbe   on Thu Sep 30, 2011  5:04 PM      Dortha Schwalbe   on Thu Sep 30, 2011  5:12 PM        Order Provider Info         Office phone Pager/beeper E-mail    Ordering User Lannie Fields, [place] [phone] -- --    Authorizing Provider Clovis Pu. Sheretha Shadd, MD [phone] -- --    Billing Provider Consuello Bossier, MD [phone] --      Order-Level Documents:     There are no  order-level documents.      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  Severe COPD    Plan:       He is gaining weight and he has stopped smoking. He has persistent groin lymphadenopathy which was present during his initial resection and this was reactive. PET scan normal. Return one year.

## 2012-04-04 ENCOUNTER — Encounter: Payer: Self-pay | Admitting: Pulmonary Disease

## 2012-04-04 ENCOUNTER — Ambulatory Visit (INDEPENDENT_AMBULATORY_CARE_PROVIDER_SITE_OTHER): Payer: Medicare Other | Admitting: Pulmonary Disease

## 2012-04-04 VITALS — BP 120/70 | HR 84 | Temp 97.8°F | Ht 70.0 in | Wt 109.2 lb

## 2012-04-04 DIAGNOSIS — J438 Other emphysema: Secondary | ICD-10-CM

## 2012-04-04 NOTE — Progress Notes (Signed)
  Subjective:    Patient ID: Hector House., male    DOB: [birth year], 66 y.o.   MRN: [id number]  HPI The patient comes in today for followup of his known severe emphysema.  He is continuing on his bronchodilator regimen, but unfortunately continues to smoke.  He has not had an acute exacerbation or chest infection since the last visit.  He feels that his dyspnea on exertion is at his usual baseline, and he denies any increasing chest congestion or mucus.   Review of Systems  Constitutional: Negative for fever and unexpected weight change.  HENT: Negative for ear pain, nosebleeds, congestion, sore throat, rhinorrhea, sneezing, trouble swallowing, dental problem, postnasal drip and sinus pressure.   Eyes: Negative for redness and itching.  Respiratory: Positive for cough and shortness of breath. Negative for chest tightness and wheezing.   Cardiovascular: Negative for palpitations and leg swelling.  Gastrointestinal: Negative for nausea and vomiting.  Genitourinary: Negative for dysuria.  Musculoskeletal: Negative for joint swelling.  Skin: Negative for rash.  Neurological: Negative for headaches.  Hematological: Does not bruise/bleed easily.  Psychiatric/Behavioral: Negative for dysphoric mood. The patient is not nervous/anxious.        Objective:   Physical Exam Thin male in no acute distress Nose without purulence or discharge noted Chest with very diminished breath sounds throughout, a few basilar crackles, no wheezing noted Cardiac exam with regular rate and rhythm Lower extremities with no edema, no cyanosis Alert and oriented, moves all 4 extremities.       Assessment & Plan:

## 2012-04-04 NOTE — Assessment & Plan Note (Signed)
The patient is stable from a COPD standpoint on his current medications, but unfortunately he continues to smoke.  I have stressed to him again the importance of total smoking cessation, and that he has very little lung function left in reserve.  He is to continue on his current medications, and to try and stay as active as possible.  I have reminded him to get his flu shot next month.

## 2012-04-04 NOTE — Patient Instructions (Signed)
Take the duoneb (2 medications) at breakfast,lunch, dinner, and bedtime.  Can use 2 additional times if having a bad day Take budesonide (the one medication) at breakfast and dinner with the other medication in nebulizer. Make sure you finish the entire nebulizer treatment at one sitting.  Stop smoking. followup with me in 6 mos.

## 2012-06-19 ENCOUNTER — Telehealth: Payer: Self-pay | Admitting: Pulmonary Disease

## 2012-06-19 NOTE — Telephone Encounter (Signed)
Pt called to let [facility] know his breathing is worse and states he cannot do much without giving out of breath. He does cough daily and sputum is white to green. Pt also still smoking 1/2 PPD. Pt would like to discuss oxygen with KC and will be seen on Tues., 06/20/12 @ 11:15.

## 2012-06-19 NOTE — Telephone Encounter (Signed)
LMTCB

## 2012-06-20 ENCOUNTER — Encounter: Payer: Self-pay | Admitting: Pulmonary Disease

## 2012-06-20 ENCOUNTER — Ambulatory Visit (INDEPENDENT_AMBULATORY_CARE_PROVIDER_SITE_OTHER): Payer: Medicare Other | Admitting: Pulmonary Disease

## 2012-06-20 VITALS — BP 102/60 | HR 84 | Temp 97.9°F | Ht 70.0 in | Wt 112.4 lb

## 2012-06-20 DIAGNOSIS — Z23 Encounter for immunization: Secondary | ICD-10-CM

## 2012-06-20 DIAGNOSIS — J441 Chronic obstructive pulmonary disease with (acute) exacerbation: Secondary | ICD-10-CM

## 2012-06-20 DIAGNOSIS — J438 Other emphysema: Secondary | ICD-10-CM

## 2012-06-20 MED ORDER — PREDNISONE 10 MG PO TABS: ORAL_TABLET | ORAL | Status: DC

## 2012-06-20 NOTE — Patient Instructions (Addendum)
Stop smoking.  This is the only way you will stay well. No change in your breathing medications Will give you the flu shot today Will check your oxygen level today walking. Will treat you with a short course of prednisone to get you thru this developing episode.  followup with me in 6mos, but call us if you are not doing well.

## 2012-06-20 NOTE — Assessment & Plan Note (Signed)
The pt has severe disease and continues to smoke.  He has seen that his functional status is deteriorating.  He believes he does need to stop smoking.  I have asked him to continue on his current bronchodilator regimen.

## 2012-06-20 NOTE — Assessment & Plan Note (Signed)
The pt is having increased sob, and has wheezing on exam today.  Unfortunately he is still smoking.  Will treat him with a short course of prednisone to get him thru this, but stressed the importance of smoking cessation.

## 2012-06-20 NOTE — Progress Notes (Signed)
  Subjective:    Patient ID: Hector House., male    DOB: 05/21/1946, 66 y.o.   MRN: [id number]  HPI The patient comes in today for an acute sick visit.  He has a known history of severe emphysema, and unfortunately continues to smoke.  He gives a few week history of slowly worsening shortness of breath as well as wheezing and cough.  He does not feel like he has a chest infection, but does occasionally bring purulent mucus.  Because of his worsening shortness of breath, he thinks he needs to be back on oxygen.  He has been staying on his bronchodilator regimen compliantly.   Review of Systems  Constitutional: Negative for fever and unexpected weight change.  HENT: Negative for ear pain, nosebleeds, congestion, sore throat, rhinorrhea, sneezing, trouble swallowing, dental problem, postnasal drip and sinus pressure.   Eyes: Positive for redness and itching.  Respiratory: Positive for shortness of breath and wheezing. Negative for cough and chest tightness.   Cardiovascular: Negative for palpitations and leg swelling.  Gastrointestinal: Negative for nausea and vomiting.  Genitourinary: Negative for dysuria.  Musculoskeletal: Negative for joint swelling.  Skin: Negative for rash.  Neurological: Negative for headaches.  Hematological: Does not bruise/bleed easily.  Psychiatric/Behavioral: Positive for dysphoric mood. The patient is not nervous/anxious.        Objective:   Physical Exam Thin male in no acute distress Nose without purulence or discharge noted Oropharynx clear Neck without lymphadenopathy or thyromegaly Chest with diminished breath sounds throughout, active wheezing and rhonchi throughout Cardiac exam with regular rate and rhythm Lower extremities without edema, no cyanosis Alert and oriented, moves all 4 extremities.       Assessment & Plan:

## 2012-08-06 ENCOUNTER — Other Ambulatory Visit: Payer: Self-pay | Admitting: Family Medicine

## 2012-08-19 ENCOUNTER — Other Ambulatory Visit: Payer: Self-pay | Admitting: Family Medicine

## 2012-09-18 ENCOUNTER — Other Ambulatory Visit: Payer: Self-pay | Admitting: Family Medicine

## 2012-09-22 ENCOUNTER — Ambulatory Visit: Admission: RE | Admit: 2012-09-22 | Payer: Medicare Other | Source: Ambulatory Visit | Admitting: Radiation Oncology

## 2012-09-22 ENCOUNTER — Telehealth: Payer: Self-pay | Admitting: *Deleted

## 2012-09-22 NOTE — Telephone Encounter (Signed)
[facility] office called, patient is still there and needs to cancel his f/u appt today, and reschedule,he cannot make it ", transferred call to Hector House, will inform MD  2:39 PM

## 2012-10-04 ENCOUNTER — Ambulatory Visit: Payer: Medicare Other | Admitting: Pulmonary Disease

## 2012-10-06 ENCOUNTER — Ambulatory Visit: Admission: RE | Admit: 2012-10-06 | Payer: Medicare Other | Source: Ambulatory Visit | Admitting: Radiation Oncology

## 2012-10-10 ENCOUNTER — Other Ambulatory Visit: Payer: Self-pay | Admitting: Family Medicine

## 2012-10-27 ENCOUNTER — Other Ambulatory Visit: Payer: Self-pay | Admitting: Family Medicine

## 2012-11-16 ENCOUNTER — Encounter: Payer: Self-pay | Admitting: Radiation Oncology

## 2012-11-17 ENCOUNTER — Ambulatory Visit
Admission: RE | Admit: 2012-11-17 | Discharge: 2012-11-17 | Disposition: A | Discharge status: Home / Self Care | Payer: Medicare Other | Source: Ambulatory Visit | Attending: Radiation Oncology | Admitting: Radiation Oncology

## 2012-11-17 ENCOUNTER — Encounter: Payer: Self-pay | Admitting: Radiation Oncology

## 2012-11-17 VITALS — BP 104/72 | HR 86 | Temp 97.7°F | Resp 18 | Wt 109.9 lb

## 2012-11-17 DIAGNOSIS — C4921 Malignant neoplasm of connective and soft tissue of right lower limb, including hip: Secondary | ICD-10-CM | POA: Insufficient documentation

## 2012-11-17 DIAGNOSIS — C4922 Malignant neoplasm of connective and soft tissue of left lower limb, including hip: Secondary | ICD-10-CM

## 2012-11-17 DIAGNOSIS — C4920 Malignant neoplasm of connective and soft tissue of unspecified lower limb, including hip: Secondary | ICD-10-CM

## 2012-11-17 DIAGNOSIS — F411 Generalized anxiety disorder: Secondary | ICD-10-CM

## 2012-11-17 NOTE — Progress Notes (Signed)
Patient presents to the clinic today unaccompanied for follow up appointment with Dr. Basilio Cairo. Patient is alert and oriented to person, place, and time. No distress noted. Steady gait noted. Pleasant affect noted. Patient denies pain at this time. Patient denies pain in the base of his right thigh. Patient reports that the skin on the base of his right thigh is of normal appearance and without lumps or bumps. Patient reports the base of his right thigh occasionally itches. Patient reports occasionally he will feel brief nerve pain in his right thigh that goes away on its own. Patient denies nausea, vomiting, headache, dizziness, or diplopia. Patient denies diarrhea. Patient reports that he has been eating very well and trying to put on weight but, can't. Encouraged patient to begin drinking ensure and increase protein within his diet. Patient reports that he continues to smoke. Patient reports shortness of breath with exertion and a smokers cough. Patient reports that he stopped drinking six month ago. Patient reports the ringing in his ears continues. Patient reports frequent episodes of anxiety and is requesting a script. Reported all findings to Dr. Basilio Cairo.

## 2012-11-17 NOTE — Progress Notes (Signed)
Radiation Oncology         [phone]) [phone] ________________________________  Name: Hector House. 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  He continues to lose weight. He reports that his right thigh feels fine, with a little bit of itching of the skin. He denies palpating any new lymph nodes in his groin. He continues to smoke. He says he has cut down substantially on his drinking but occasionally will have some beers on the weekends. He reports anxiety. He says his anxiety is related to decreased stamina and not being able to be as active as he used to be when he was healthier. He is requesting a prescription for this.  ALLERGIES:  has No Known Allergies.  Meds: Current Outpatient Prescriptions  Medication Sig Dispense Refill  . aspirin 81 MG tablet Take 81 mg by mouth daily.       . budesonide (PULMICORT) 0.5 MG/2ML nebulizer solution Take 0.5 mg by nebulization 2 (two) times daily.      . carbamide peroxide (DEBROX) 6.5 % otic solution Place 10 drops into the left ear 2 (two) times daily. For 4 days       . dextromethorphan-guaiFENesin (MUCINEX DM) 30-600 MG per 12 hr tablet Take 1 tablet by mouth every 12 (twelve) hours.      Marland Kitchen ipratropium-albuterol (DUONEB) 0.5-2.5 (3) MG/3ML SOLN Take 3 mLs by nebulization 4 (four) times daily. Can use an additional 2 treatments if needed.  360 mL  3  . lisinopril-hydrochlorothiazide (PRINZIDE,ZESTORETIC) 20-25 MG per tablet TAKE 1 TABLET BY MOUTH DAILY  90 tablet  0  . PROAIR HFA 108 (90 BASE) MCG/ACT inhaler INHALE 2 PUFFS BY MOUTH EVERY 4 TO 6 HOURS AS NEEDED  8.5 g  3  . predniSONE (DELTASONE) 10 MG tablet Take 4 tabs x 2 days,3 tabs x 2  days,2 tabs x 2 days, 1 tab x 2 days and stop.  20 tablet  0   Current Facility-Administered Medications  Medication Dose Route Frequency Provider Last Rate Last Dose  . pneumococcal 23 valent vaccine (PNU-IMMUNE) injection 0.5 mL  0.5 mL Intramuscular Once Gerene Nedd T [place], MD        Physical Findings: The patient is in no acute distress. Patient is alert and oriented.  weight is 109 lb 14.4 oz (49.85 kg). His oral temperature is 97.7 F (36.5 C). His blood pressure is 104/72 and his pulse is 86. His respiration is 18 and oxygen saturation is 91%. . No acute distress. Skin well-healed over his right thigh. No palpable nodularity to suggest recurrence. No palpable lymphadenopathy in the groin bilaterally.  Lab Findings: Lab Results  Component Value Date   WBC 12.1* 07/10/2010   HGB 16.6 07/10/2010   HCT 47.2 07/10/2010   MCV 95.8 07/10/2010   PLT 235 07/10/2010      Radiographic Findings: No results found.  Impression/Plan: No evidence of recurrent disease. He continues to lose weight. He does continue to smoke, reports that he has cut down on his drinking.  I suggested the patient talked to Dr. Shelle Iron about whether he would recommend low-dose CT imaging of the patient's chest to survey for  lung cancer in light of his smoking history.  I talked with the patient about social work - services they may be able to refer him to for his anxiety. He is open to counseling. I think this is a good first step in terms of his anxiety rather than starting him on any medication. I will refer to social work.  From the standpoint of sarcoma., He will continue to follow with Dr. Luisa Hart as scheduled. I will see him back in one year. He knows to call w/ any issues in the interim.  I spent 20 minutes minutes face to face with the patient and more than 50% of that time was spent in counseling and/or coordination of care. _____________________________________   Lonie Peak, MD

## 2012-11-20 ENCOUNTER — Encounter: Payer: Self-pay | Admitting: Radiation Oncology

## 2012-11-21 ENCOUNTER — Encounter: Payer: Self-pay | Admitting: *Deleted

## 2012-11-21 NOTE — Progress Notes (Signed)
Clinical Social Worker was consulted by Careers information officer for anxiety.  CSW contacted pt at home to assess for psychosocial needs and concerns.  Pt expressed increased anxiety and struggles with adjusting to life changes due to multiple health concerns.  Pt was very pleasant and open to counseling and supportive services.  Pt was agreeable to CSW making a referral to the Doctoral Counseling interns.  CSW encouraged pt to call with any questions or concerns.    Tamala Julian, MSW, LCSW Clinical Social Worker [facility] [phone]

## 2012-11-22 ENCOUNTER — Other Ambulatory Visit: Payer: Self-pay | Admitting: Family Medicine

## 2012-12-18 ENCOUNTER — Ambulatory Visit: Payer: Medicare Other | Admitting: Pulmonary Disease

## 2012-12-20 ENCOUNTER — Ambulatory Visit (INDEPENDENT_AMBULATORY_CARE_PROVIDER_SITE_OTHER): Payer: Medicare Other | Admitting: Pulmonary Disease

## 2012-12-20 ENCOUNTER — Encounter: Payer: Self-pay | Admitting: Pulmonary Disease

## 2012-12-20 VITALS — BP 108/78 | HR 87 | Temp 97.1°F | Ht 67.5 in | Wt 112.0 lb

## 2012-12-20 DIAGNOSIS — J438 Other emphysema: Secondary | ICD-10-CM

## 2012-12-20 NOTE — Assessment & Plan Note (Signed)
The patient appears to be at his usual baseline since the last visit, but it least is making a very strong effort at smoking cessation.  I've asked him to continue on his current bronchodilator regimen, and offered to him again a referral to pulmonary rehabilitation.  The patient would like to do his exercises on his own.  I have asked him to come back and see me in 6 months, and to call if he is having worsening symptoms.

## 2012-12-20 NOTE — Progress Notes (Signed)
  Subjective:    Patient ID: Hector House., male    DOB: [birth year], 67 y.o.   MRN: [id number]  HPI The pt comes in today for f/u of his know copd.  He tells me that he has not smoked in 3 weeks, and I have congratulated him on this.  He feels his breathing is near his usual baseline, and cough is better since being off cigs.  No acute exacerbation since last visit, and has been staying on nebulized BD.    Review of Systems  Constitutional: Negative for fever and unexpected weight change.  HENT: Negative for ear pain, nosebleeds, congestion, sore throat, rhinorrhea, sneezing, trouble swallowing, dental problem, postnasal drip and sinus pressure.   Eyes: Negative for redness and itching.  Respiratory: Positive for shortness of breath. Negative for cough, chest tightness and wheezing.   Cardiovascular: Negative for palpitations and leg swelling.  Gastrointestinal: Negative for nausea and vomiting.  Genitourinary: Negative for dysuria.  Musculoskeletal: Negative for joint swelling.  Skin: Negative for rash.  Neurological: Negative for headaches.  Hematological: Does not bruise/bleed easily.  Psychiatric/Behavioral: Negative for dysphoric mood. The patient is not nervous/anxious.        Objective:   Physical Exam Thin male in nad Nose without purulence or discharge Neck without LN or TMG Chest with very decreased bs, no wheezing. Cor with rrr LE without edema, no cyanosis Alert and oriented, moves all 4.        Assessment & Plan:

## 2012-12-20 NOTE — Patient Instructions (Addendum)
No change in your breathing medications. Continue to stay away from smoking.  You are doing great! followup with me in 6mos

## 2012-12-26 ENCOUNTER — Other Ambulatory Visit: Payer: Self-pay | Admitting: Family Medicine

## 2013-01-07 DEATH — deceased

## 2013-01-15 ENCOUNTER — Encounter: Payer: Self-pay | Admitting: Specialist

## 2013-01-29 ENCOUNTER — Other Ambulatory Visit: Payer: Self-pay | Admitting: Family Medicine

## 2013-01-29 ENCOUNTER — Other Ambulatory Visit: Payer: Self-pay | Admitting: *Deleted

## 2013-01-29 MED ORDER — ALBUTEROL SULFATE HFA 108 (90 BASE) MCG/ACT IN AERS: INHALATION_SPRAY | RESPIRATORY_TRACT | Status: DC

## 2013-01-29 MED ORDER — ASPIRIN 81 MG PO TABS: 81.0000 mg | ORAL_TABLET | Freq: Every day | ORAL | Status: DC

## 2013-03-19 ENCOUNTER — Encounter (INDEPENDENT_AMBULATORY_CARE_PROVIDER_SITE_OTHER): Payer: Self-pay | Admitting: Surgery

## 2013-03-19 ENCOUNTER — Ambulatory Visit (INDEPENDENT_AMBULATORY_CARE_PROVIDER_SITE_OTHER): Payer: Medicare Other | Admitting: Surgery

## 2013-03-19 VITALS — BP 138/80 | HR 88 | Temp 97.3°F | Resp 16 | Ht 70.0 in | Wt 106.4 lb

## 2013-03-19 DIAGNOSIS — Z85831 Personal history of malignant neoplasm of soft tissue: Secondary | ICD-10-CM

## 2013-03-19 DIAGNOSIS — Z8589 Personal history of malignant neoplasm of other organs and systems: Secondary | ICD-10-CM

## 2013-03-19 NOTE — Progress Notes (Signed)
Subjective:     Patient ID: Hector House., male   DOB: 1946/08/09, 67 y.o.   MRN: [id number]  HPI The patient returns for 12 month followup after excision of right thigh sarcoma 2011. He has completed radiation therapy. He also underwent biopsy of right femoral lymph node. He has severe COPD and is quite physically debilitated. He does have chronic pain and continues to smoke.   Anxiety has been an issue.    Review of Systems  Constitutional: Positive for activity change, appetite change, fatigue and unexpected weight change.  HENT: Negative.   Eyes: Negative.   Respiratory: Positive for cough and shortness of breath.   Cardiovascular: Negative.   Gastrointestinal: Negative.   Musculoskeletal: Positive for back pain and arthralgias.  Neurological: Negative.   Hematological: Negative.   Psychiatric/Behavioral: Negative.      Physical Exam  Constitutional: He is oriented to person, place, and time.  HENT:  Head: Normocephalic and atraumatic.  Eyes: EOM are normal. Pupils are equal, round, and reactive to light.  Neck: Normal range of motion. Neck supple.  Abdominal: Soft. Bowel sounds are normal.  Musculoskeletal:       Right thigh scar well healed. No mass. Bilateral femoral mildly enlarged lymph nodes.  Neurological: He is alert and oriented to person, place, and time.  Skin: Skin is warm and dry.  Psychiatric: His behavior is normal. Judgment and thought content normal. His mood appears anxious. Cognition and memory are normal.                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  Severe COPD    Plan:        Return one year. Stable exam.

## 2013-03-19 NOTE — Patient Instructions (Signed)
Return 1 year. 

## 2013-03-23 ENCOUNTER — Other Ambulatory Visit: Payer: Self-pay | Admitting: Family Medicine

## 2013-03-26 ENCOUNTER — Other Ambulatory Visit: Payer: Self-pay | Admitting: *Deleted

## 2013-03-27 ENCOUNTER — Telehealth: Payer: Self-pay | Admitting: Pulmonary Disease

## 2013-03-27 ENCOUNTER — Other Ambulatory Visit: Payer: Self-pay | Admitting: Pulmonary Disease

## 2013-03-27 MED ORDER — ALBUTEROL SULFATE HFA 108 (90 BASE) MCG/ACT IN AERS: INHALATION_SPRAY | RESPIRATORY_TRACT | Status: DC

## 2013-03-27 NOTE — Telephone Encounter (Signed)
ATC patient no answer [id number]  Rx has been sent to pharmacy

## 2013-03-27 NOTE — Telephone Encounter (Signed)
Ok to fill with 11 refills

## 2013-03-27 NOTE — Telephone Encounter (Signed)
Spoke to pt. I do not see anywhere in his chart where we have rx'd this for him before. Advised him that we would have to get [facility] permission to send this in. He became hostile and agitated when I told him this. States that he has to have this rx!   KC - please advise if you are okay with sending in this rx. Thanks.

## 2013-03-29 NOTE — Telephone Encounter (Signed)
Pt picked up rx from pharmacy. Carron Curie, CMA

## 2013-04-02 ENCOUNTER — Other Ambulatory Visit: Payer: Self-pay | Admitting: *Deleted

## 2013-04-02 MED ORDER — IPRATROPIUM-ALBUTEROL 0.5-2.5 (3) MG/3ML IN SOLN: 3.0000 mL | Freq: Four times a day (QID) | RESPIRATORY_TRACT | Status: DC

## 2013-04-02 MED ORDER — BUDESONIDE 0.5 MG/2ML IN SUSP: 0.5000 mg | Freq: Two times a day (BID) | RESPIRATORY_TRACT | Status: DC

## 2013-04-02 NOTE — Telephone Encounter (Signed)
[facility] requests refills of Budesonide .5mg  BID and Duoneb QID and 2 additional treatments if needed via nebulizer.  Printed for [facility] to sign To be faxed to 1 [phone]

## 2013-06-19 ENCOUNTER — Encounter: Payer: Self-pay | Admitting: Pulmonary Disease

## 2013-06-19 ENCOUNTER — Ambulatory Visit (INDEPENDENT_AMBULATORY_CARE_PROVIDER_SITE_OTHER): Payer: Medicare Other | Admitting: Pulmonary Disease

## 2013-06-19 VITALS — BP 110/60 | HR 94 | Temp 98.7°F | Ht 68.5 in | Wt 105.2 lb

## 2013-06-19 DIAGNOSIS — J441 Chronic obstructive pulmonary disease with (acute) exacerbation: Secondary | ICD-10-CM

## 2013-06-19 DIAGNOSIS — J439 Emphysema, unspecified: Secondary | ICD-10-CM

## 2013-06-19 DIAGNOSIS — J438 Other emphysema: Secondary | ICD-10-CM

## 2013-06-19 MED ORDER — PREDNISONE 10 MG PO TABS: ORAL_TABLET | ORAL | Status: DC

## 2013-06-19 NOTE — Assessment & Plan Note (Signed)
The patient is having increasing shortness of breath to the point that he cannot walk room to room without severe symptoms.  However, he is continuing to smoke.  I will treat him with a short course of prednisone, but stressed to him the importance of total smoking cessation.  He is to also continue on his aggressive bronchodilator regimen.  Finally, we'll check his overnight oximetry to make sure that he does not need nocturnal oxygen.

## 2013-06-19 NOTE — Patient Instructions (Addendum)
No change in medications for breathing  Will treat you with an 8 day course of prednisone to see if we can improve your breathing.  Let us know if you begin to cough up more mucus. You must stop smoking to have any chance of improving your breathing.  Will check oxygen level overnight, and call you with results.  Will see you back in 4mos, but call if having worsening shortness of breath.

## 2013-06-19 NOTE — Progress Notes (Signed)
  Subjective:    Patient ID: Hector Octave., male    DOB: 09/02/1945, 67 y.o.   MRN: [id number]  HPI The patient comes in today for followup of his nose and severe COPD.  Unfortunately, he continues to smoke, and has seen a significant decline in his breathing since last visit.  He will get short of breath walking room to room, but at least he is staying on his bronchodilators compliantly.  He is asking about supplemental oxygen, and he has not qualified in the past.  He has a cough with small quantities of discolored mucus, but does not feel it is any worse than his usual baseline.  He does not feel that he has a chest infection at this time.   Review of Systems  Constitutional: Positive for unexpected weight change. Negative for fever.  HENT: Negative for congestion, dental problem, ear pain, nosebleeds, postnasal drip, rhinorrhea, sinus pressure, sneezing, sore throat and trouble swallowing.   Eyes: Negative for redness and itching.  Respiratory: Positive for cough, chest tightness, shortness of breath and wheezing.   Cardiovascular: Negative for palpitations and leg swelling.  Gastrointestinal: Negative for nausea and vomiting.  Genitourinary: Negative for dysuria.  Musculoskeletal: Negative for joint swelling.  Skin: Negative for rash.  Neurological: Negative for headaches.  Hematological: Does not bruise/bleed easily.  Psychiatric/Behavioral: Negative for dysphoric mood. The patient is not nervous/anxious.        Objective:   Physical Exam Cachectic male with mild increased work of breathing. Nose without purulence or discharge noted Neck without lymphadenopathy or thyromegaly Chest with very diminished breath sounds, scattered wheezes and rhonchi Cardiac exam with regular rate and rhythm Lower extremities without edema, no cyanosis Alert and oriented, moves all 4 extremities.       Assessment & Plan:

## 2013-06-27 ENCOUNTER — Telehealth: Payer: Self-pay | Admitting: *Deleted

## 2013-06-27 ENCOUNTER — Encounter: Payer: Self-pay | Admitting: Pulmonary Disease

## 2013-06-27 NOTE — Telephone Encounter (Signed)
Results have been explained to patient, pt expressed understanding. Nothing further needed.  

## 2013-06-27 NOTE — Telephone Encounter (Signed)
ONO results have been received and placed in [facility] folder for review. Please advise, thanks.   

## 2013-06-27 NOTE — Telephone Encounter (Signed)
Please let pt know that his oxygen level drops during the night for only the entire night.  Does not need oxygen

## 2013-07-13 ENCOUNTER — Encounter: Payer: Self-pay | Admitting: Pulmonary Disease

## 2013-10-10 ENCOUNTER — Emergency Department (HOSPITAL_COMMUNITY): Payer: Medicare Other

## 2013-10-10 ENCOUNTER — Encounter (HOSPITAL_COMMUNITY): Payer: Self-pay | Admitting: Emergency Medicine

## 2013-10-10 ENCOUNTER — Inpatient Hospital Stay (HOSPITAL_COMMUNITY)
Admission: EM | Admit: 2013-10-10 | Discharge: 2013-10-13 | DRG: 190 | Disposition: A | Discharge status: Home Health | Payer: Medicare Other | Attending: Family Medicine | Admitting: Family Medicine

## 2013-10-10 DIAGNOSIS — E876 Hypokalemia: Secondary | ICD-10-CM | POA: Diagnosis present

## 2013-10-10 DIAGNOSIS — Z923 Personal history of irradiation: Secondary | ICD-10-CM

## 2013-10-10 DIAGNOSIS — J9600 Acute respiratory failure, unspecified whether with hypoxia or hypercapnia: Secondary | ICD-10-CM | POA: Diagnosis present

## 2013-10-10 DIAGNOSIS — K219 Gastro-esophageal reflux disease without esophagitis: Secondary | ICD-10-CM | POA: Diagnosis present

## 2013-10-10 DIAGNOSIS — Z7982 Long term (current) use of aspirin: Secondary | ICD-10-CM

## 2013-10-10 DIAGNOSIS — E43 Unspecified severe protein-calorie malnutrition: Secondary | ICD-10-CM | POA: Diagnosis present

## 2013-10-10 DIAGNOSIS — J441 Chronic obstructive pulmonary disease with (acute) exacerbation: Secondary | ICD-10-CM | POA: Diagnosis present

## 2013-10-10 DIAGNOSIS — F17200 Nicotine dependence, unspecified, uncomplicated: Secondary | ICD-10-CM | POA: Diagnosis present

## 2013-10-10 DIAGNOSIS — Z85828 Personal history of other malignant neoplasm of skin: Secondary | ICD-10-CM

## 2013-10-10 DIAGNOSIS — Z681 Body mass index (BMI) 19 or less, adult: Secondary | ICD-10-CM

## 2013-10-10 DIAGNOSIS — Z23 Encounter for immunization: Secondary | ICD-10-CM | POA: Diagnosis not present

## 2013-10-10 DIAGNOSIS — Z79899 Other long term (current) drug therapy: Secondary | ICD-10-CM | POA: Diagnosis not present

## 2013-10-10 DIAGNOSIS — T502X5A Adverse effect of carbonic-anhydrase inhibitors, benzothiadiazides and other diuretics, initial encounter: Secondary | ICD-10-CM | POA: Diagnosis present

## 2013-10-10 DIAGNOSIS — C4921 Malignant neoplasm of connective and soft tissue of right lower limb, including hip: Secondary | ICD-10-CM | POA: Diagnosis present

## 2013-10-10 DIAGNOSIS — F411 Generalized anxiety disorder: Secondary | ICD-10-CM | POA: Diagnosis present

## 2013-10-10 DIAGNOSIS — I959 Hypotension, unspecified: Secondary | ICD-10-CM | POA: Diagnosis present

## 2013-10-10 DIAGNOSIS — D72829 Elevated white blood cell count, unspecified: Secondary | ICD-10-CM | POA: Diagnosis present

## 2013-10-10 DIAGNOSIS — I10 Essential (primary) hypertension: Secondary | ICD-10-CM | POA: Diagnosis present

## 2013-10-10 DIAGNOSIS — R0602 Shortness of breath: Secondary | ICD-10-CM | POA: Diagnosis not present

## 2013-10-10 LAB — COMPREHENSIVE METABOLIC PANEL
ALT: 16 U/L (ref 0–53)
AST: 18 U/L (ref 0–37)
Albumin: 3.7 g/dL (ref 3.5–5.2)
Alkaline Phosphatase: 63 U/L (ref 39–117)
BILIRUBIN TOTAL: 0.4 mg/dL (ref 0.3–1.2)
BUN: 9 mg/dL (ref 6–23)
CO2: 29 meq/L (ref 19–32)
Calcium: 8.6 mg/dL (ref 8.4–10.5)
Chloride: 87 mEq/L — ABNORMAL LOW (ref 96–112)
Creatinine, Ser: 0.63 mg/dL (ref 0.50–1.35)
GFR calc non Af Amer: >90 mL/min (ref >90)
GLUCOSE: 162 mg/dL — AB (ref 70–99)
POTASSIUM: 3.2 meq/L — AB (ref 3.7–5.3)
Sodium: 129 mEq/L — ABNORMAL LOW (ref 137–147)
TOTAL PROTEIN: 6.4 g/dL (ref 6.0–8.3)

## 2013-10-10 LAB — CBC WITH DIFFERENTIAL/PLATELET
BASOS ABS: 0 10*3/uL (ref 0.0–0.1)
Basophils Relative: 0 % (ref 0–1)
EOS PCT: 0 % (ref 0–5)
Eosinophils Absolute: 0 10*3/uL (ref 0.0–0.7)
HEMATOCRIT: 37.5 % — AB (ref 39.0–52.0)
Hemoglobin: 13.4 g/dL (ref 13.0–17.0)
Lymphocytes Relative: 2 % — ABNORMAL LOW (ref 12–46)
Lymphs Abs: 0.3 10*3/uL — ABNORMAL LOW (ref 0.7–4.0)
MCH: 32.9 pg (ref 26.0–34.0)
MCHC: 35.7 g/dL (ref 30.0–36.0)
MCV: 92.1 fL (ref 78.0–100.0)
MONO ABS: 0.1 10*3/uL (ref 0.1–1.0)
Monocytes Relative: 1 % — ABNORMAL LOW (ref 3–12)
NEUTROS ABS: 12.5 10*3/uL — AB (ref 1.7–7.7)
Neutrophils Relative %: 97 % — ABNORMAL HIGH (ref 43–77)
Platelets: 212 10*3/uL (ref 150–400)
RBC: 4.07 MIL/uL — ABNORMAL LOW (ref 4.22–5.81)
RDW: 12.3 % (ref 11.5–15.5)
WBC: 12.9 10*3/uL — ABNORMAL HIGH (ref 4.0–10.5)

## 2013-10-10 LAB — I-STAT TROPONIN, ED: Troponin i, poc: 0.04 ng/mL (ref 0.00–0.08)

## 2013-10-10 LAB — BASIC METABOLIC PANEL
BUN: 9 mg/dL (ref 6–23)
CHLORIDE: 88 meq/L — AB (ref 96–112)
CO2: 32 mEq/L (ref 19–32)
Calcium: 9.1 mg/dL (ref 8.4–10.5)
Creatinine, Ser: 0.83 mg/dL (ref 0.50–1.35)
GFR calc Af Amer: >90 mL/min (ref >90)
GFR calc non Af Amer: 90 mL/min — ABNORMAL LOW (ref >90)
GLUCOSE: 118 mg/dL — AB (ref 70–99)
Potassium: 2.7 mEq/L — CL (ref 3.7–5.3)
Sodium: 133 mEq/L — ABNORMAL LOW (ref 137–147)

## 2013-10-10 LAB — CBC
HCT: 40.6 % (ref 39.0–52.0)
HEMOGLOBIN: 14.7 g/dL (ref 13.0–17.0)
MCH: 33.4 pg (ref 26.0–34.0)
MCHC: 36.2 g/dL — ABNORMAL HIGH (ref 30.0–36.0)
MCV: 92.3 fL (ref 78.0–100.0)
Platelets: 221 10*3/uL (ref 150–400)
RBC: 4.4 MIL/uL (ref 4.22–5.81)
RDW: 12.2 % (ref 11.5–15.5)
WBC: 14.3 10*3/uL — ABNORMAL HIGH (ref 4.0–10.5)

## 2013-10-10 LAB — I-STAT ARTERIAL BLOOD GAS, ED
Acid-Base Excess: 7 mmol/L — ABNORMAL HIGH (ref 0.0–2.0)
Bicarbonate: 33 mEq/L — ABNORMAL HIGH (ref 20.0–24.0)
O2 Saturation: 50 %
PO2 ART: 27 mmHg — AB (ref 80.0–100.0)
Patient temperature: 98.6
TCO2: 34 mmol/L (ref 0–100)
pCO2 arterial: 50.9 mmHg — ABNORMAL HIGH (ref 35.0–45.0)
pH, Arterial: 7.419 (ref 7.350–7.450)

## 2013-10-10 LAB — MRSA PCR SCREENING: MRSA by PCR: NEGATIVE

## 2013-10-10 LAB — TROPONIN I: Troponin I: <0.3 ng/mL (ref <0.30)

## 2013-10-10 MED ORDER — LEVOFLOXACIN IN D5W 500 MG/100ML IV SOLN
500.0000 mg | INTRAVENOUS | Status: DC
  Administered 2013-10-10 – 2013-10-11 (×2): 500 mg via INTRAVENOUS
  Filled 2013-10-10 (×2): qty 100

## 2013-10-10 MED ORDER — PNEUMOCOCCAL VAC POLYVALENT 25 MCG/0.5ML IJ INJ
0.5000 mL | INJECTION | Freq: Once | INTRAMUSCULAR | Status: AC
  Administered 2013-10-10: 0.5 mL via INTRAMUSCULAR
  Filled 2013-10-10: qty 0.5

## 2013-10-10 MED ORDER — LORAZEPAM 2 MG/ML IJ SOLN
0.5000 mg | Freq: Once | INTRAMUSCULAR | Status: AC
  Administered 2013-10-10: 0.5 mg via INTRAVENOUS
  Filled 2013-10-10: qty 1

## 2013-10-10 MED ORDER — LORAZEPAM 1 MG PO TABS: 1.0000 mg | ORAL_TABLET | Freq: Once | ORAL | Status: DC

## 2013-10-10 MED ORDER — ALBUTEROL SULFATE (2.5 MG/3ML) 0.083% IN NEBU
2.5000 mg | INHALATION_SOLUTION | Freq: Four times a day (QID) | RESPIRATORY_TRACT | Status: DC
Start: 2013-10-10 — End: 2013-10-10

## 2013-10-10 MED ORDER — IPRATROPIUM BROMIDE 0.02 % IN SOLN: 0.5000 mg | Freq: Four times a day (QID) | RESPIRATORY_TRACT | Status: DC

## 2013-10-10 MED ORDER — ONDANSETRON HCL 4 MG PO TABS: 4.0000 mg | ORAL_TABLET | Freq: Four times a day (QID) | ORAL | Status: DC | PRN

## 2013-10-10 MED ORDER — ACETAMINOPHEN 325 MG PO TABS: 650.0000 mg | ORAL_TABLET | Freq: Four times a day (QID) | ORAL | Status: DC | PRN

## 2013-10-10 MED ORDER — ALBUTEROL SULFATE (2.5 MG/3ML) 0.083% IN NEBU
2.5000 mg | INHALATION_SOLUTION | RESPIRATORY_TRACT | Status: DC | PRN
  Administered 2013-10-10 – 2013-10-12 (×5): 2.5 mg via RESPIRATORY_TRACT
  Filled 2013-10-10 (×5): qty 3

## 2013-10-10 MED ORDER — SODIUM CHLORIDE 0.9 % IV SOLN
INTRAVENOUS | Status: DC
Start: 2013-10-10 — End: 2013-10-13

## 2013-10-10 MED ORDER — IPRATROPIUM BROMIDE 0.02 % IN SOLN
RESPIRATORY_TRACT | Status: AC
  Administered 2013-10-10: 0.5 mg
  Filled 2013-10-10: qty 2.5

## 2013-10-10 MED ORDER — SODIUM CHLORIDE 0.9 % IJ SOLN
3.0000 mL | Freq: Two times a day (BID) | INTRAMUSCULAR | Status: DC
Start: 2013-10-10 — End: 2013-10-13
  Administered 2013-10-10 – 2013-10-12 (×5): 3 mL via INTRAVENOUS

## 2013-10-10 MED ORDER — DM-GUAIFENESIN ER 30-600 MG PO TB12
1.0000 | ORAL_TABLET | Freq: Two times a day (BID) | ORAL | Status: DC
  Administered 2013-10-10 – 2013-10-13 (×7): 1 via ORAL
  Filled 2013-10-10 (×8): qty 1

## 2013-10-10 MED ORDER — ONDANSETRON HCL 4 MG/2ML IJ SOLN: 4.0000 mg | Freq: Four times a day (QID) | INTRAMUSCULAR | Status: DC | PRN

## 2013-10-10 MED ORDER — POTASSIUM CHLORIDE CRYS ER 20 MEQ PO TBCR
60.0000 meq | EXTENDED_RELEASE_TABLET | Freq: Once | ORAL | Status: AC
  Administered 2013-10-10: 60 meq via ORAL
  Filled 2013-10-10: qty 3

## 2013-10-10 MED ORDER — BUDESONIDE 0.5 MG/2ML IN SUSP
0.5000 mg | Freq: Two times a day (BID) | RESPIRATORY_TRACT | Status: DC
  Administered 2013-10-10 – 2013-10-13 (×5): 0.5 mg via RESPIRATORY_TRACT
  Filled 2013-10-10 (×11): qty 2

## 2013-10-10 MED ORDER — ACETAMINOPHEN 650 MG RE SUPP: 650.0000 mg | Freq: Four times a day (QID) | RECTAL | Status: DC | PRN

## 2013-10-10 MED ORDER — ALBUTEROL SULFATE (2.5 MG/3ML) 0.083% IN NEBU
INHALATION_SOLUTION | RESPIRATORY_TRACT | Status: AC
  Administered 2013-10-10: 5 mg
  Filled 2013-10-10: qty 6

## 2013-10-10 MED ORDER — ENOXAPARIN SODIUM 30 MG/0.3ML ~~LOC~~ SOLN
30.0000 mg | SUBCUTANEOUS | Status: DC
  Administered 2013-10-10 – 2013-10-12 (×3): 30 mg via SUBCUTANEOUS
  Filled 2013-10-10 (×4): qty 0.3

## 2013-10-10 MED ORDER — IPRATROPIUM-ALBUTEROL 0.5-2.5 (3) MG/3ML IN SOLN
3.0000 mL | Freq: Four times a day (QID) | RESPIRATORY_TRACT | Status: DC
Start: 2013-10-10 — End: 2013-10-11
  Administered 2013-10-10 – 2013-10-11 (×4): 3 mL via RESPIRATORY_TRACT
  Filled 2013-10-10 (×4): qty 3

## 2013-10-10 MED ORDER — ALPRAZOLAM 0.25 MG PO TABS
0.2500 mg | ORAL_TABLET | Freq: Once | ORAL | Status: AC
  Administered 2013-10-10: 0.25 mg via ORAL
  Filled 2013-10-10: qty 1

## 2013-10-10 MED ORDER — ASPIRIN 81 MG PO CHEW
81.0000 mg | CHEWABLE_TABLET | Freq: Every day | ORAL | Status: DC
  Administered 2013-10-10 – 2013-10-13 (×4): 81 mg via ORAL
  Filled 2013-10-10 (×4): qty 1

## 2013-10-10 MED ORDER — POTASSIUM CHLORIDE 10 MEQ/100ML IV SOLN
10.0000 meq | Freq: Once | INTRAVENOUS | Status: AC
  Administered 2013-10-10: 10 meq via INTRAVENOUS
  Filled 2013-10-10: qty 100

## 2013-10-10 MED ORDER — METHYLPREDNISOLONE SODIUM SUCC 40 MG IJ SOLR
40.0000 mg | Freq: Three times a day (TID) | INTRAMUSCULAR | Status: DC
  Administered 2013-10-10 – 2013-10-12 (×8): 40 mg via INTRAVENOUS
  Filled 2013-10-10 (×10): qty 1

## 2013-10-10 NOTE — Care Management Note (Addendum)
    Page 1 of 2   10/15/2013     11:17:41 AM   CARE MANAGEMENT NOTE 10/15/2013  Patient:  Hector House, Hector House   Account Number:  [id number]  Date Initiated:  10/10/2013  Documentation initiated by:  Elissa Hefty  Subjective/Objective Assessment:   adm w copd exacerb     Action/Plan:   lives alone   Anticipated DC Date:     Anticipated DC Plan:  [place]  CM consult      [facility]  HOME HEALTH   Choice offered to / List presented to:  C-1 Patient        [place] arranged  HH-1 RN  [place]   Status of service:   Medicare Important Message given?   (If response is "NO", the following Medicare IM given date fields will be blank) Date Medicare IM given:   Date Additional Medicare IM given:    Discharge Disposition:  [place]  Per UR Regulation:  Reviewed for med. necessity/level of care/duration of stay  If discussed at [place] of Stay Meetings, dates discussed:    Comments:  late entry: 3/9 1116 debbie Hermes Wafer rn,bsn pt disch on sat. have spoken w [facility] to let them know pt disch. hhc order placed and have asked dr Verlon Au to do face to face when he can.  3/5 1115a debbie Evelio Rueda rn,bsn spoke w pt. he's very thin and doesn't eat well. he buys ensure occas but not often due to cost. enc pt to buy generic ensure when he can. he also was ok w mobile meals ref being made.he has no hx of hhc but agreeable to hhrn after disch. ref to senior services for mobile meals at [phone]. they will need call at disch. pt also given their phone number. gave pt hhc agency list. no pref. ref to [facility] they have copd program. ref to [facility] for hhrn and poss hhpt.

## 2013-10-10 NOTE — ED Notes (Signed)
Pt called 911 for SOB.  Given one dupneb by EMS, and 125 solumedrol.  Pt was placed on NRB 15lpm and was noted to be 82%.  Pt given 2.5 versed for anxiety related to CPAP and placed on CPAP.  O2 sat of 99% noted upon arrival

## 2013-10-10 NOTE — H&P (Signed)
[facility] History and Physical  Belton Altamira. [id number] DOB: 03/08/1946 DOA: 10/10/2013  Referring physician: ER physician. PCP: Pcp Not In System   Chief Complaint: Shortness of breath.  HPI: Hector House. is a 68 y.o. male with history of COPD, hypertension, sarcoma of the right thigh presents with complaints of shortness of breath. Patient states since yesterday morning he has been short of breath with productive cough. Occasionally has pleuritic chest pain. Denies any fever chills. In the ER patient was found to be acutely short of breath and was initially placed on BiPAP eventually was weaned off after patient's breathing improved and percent he is on 4 L nasal cannula. Patient otherwise denies any nausea vomiting abdominal pain diarrhea. Patient will be admitted for further management of his COPD.   Review of Systems: As presented in the history of presenting illness, rest negative.  Past Medical History  Diagnosis Date  . Hypertension   . Cancer     skin  . Arthritis   . Low back pain syndrome     Joint Pain  . Emphysema   . S/P radiation therapy 07/23/10 - 09/14/10    Right Thigh for Leiomyosarcoma / 61.2 Gy / 34 Fractions  . Reflux   . Leiomyosarcoma of thigh 2011   Past Surgical History  Procedure Laterality Date  . Laminectomy  2008    Three back surgeries per patient medical history form dated 05/06/10.  Marland Kitchen Elbow surgery  1967    L  . Leg skin lesion  biopsy / excision  04/20/10 / re-excision 06/18/10     Right thigh then re-excision of Lymph node right \\grion  06/18/10   Social History:  reports that he has been smoking Cigarettes.  He has a 6.45 pack-year smoking history. He has never used smokeless tobacco. He reports that he drinks about 10.8 ounces of alcohol per week. He reports that he does not use illicit drugs. Where does patient live home Can patient participate in ADLs? Yes.  No Known Allergies  Family History:  Family History  Problem  Relation Age of Onset  . Heart disease Mother     heart attack      Prior to Admission medications   Medication Sig Start Date End Date Taking? Authorizing Provider  albuterol (PROAIR HFA) 108 (90 BASE) MCG/ACT inhaler INHALE 2 PUFFS BY MOUTH EVERY 4 TO 6 HOURS AS NEEDED 03/27/13   Kathee Delton, MD  aspirin 81 MG tablet Take 1 tablet (81 mg total) by mouth daily. 01/29/13   Jayce G Cook, DO  budesonide (PULMICORT) 0.5 MG/2ML nebulizer solution Take 2 mLs (0.5 mg total) by nebulization 2 (two) times daily. 04/02/13   Kathee Delton, MD  carbamide peroxide (DEBROX) 6.5 % otic solution Place 10 drops into the left ear as needed. For 4 days    Historical Provider, MD  dextromethorphan-guaiFENesin (MUCINEX DM) 30-600 MG per 12 hr tablet Take 1 tablet by mouth every 12 (twelve) hours.    Historical Provider, MD  ipratropium-albuterol (DUONEB) 0.5-2.5 (3) MG/3ML SOLN Take 3 mLs by nebulization 4 (four) times daily. Can use an additional 2 treatments if needed. 04/02/13   Kathee Delton, MD  lisinopril-hydrochlorothiazide (PRINZIDE,ZESTORETIC) 20-25 MG per tablet TAKE 1 TABLET BY MOUTH DAILY 11/22/12   Coral Spikes, DO  Multiple Vitamins-Minerals (CENTRUM SILVER PO) Take by mouth daily.    Historical Provider, MD  naproxen sodium (ANAPROX) 220 MG tablet Take 220 mg by mouth as needed.  Historical Provider, MD  predniSONE (DELTASONE) 10 MG tablet Take 4 tabs daily x 2 days, 3 tabs daily x 2 days, 2 tabs daily x 2 days, 1 tab daily x 2 days then stop 06/19/13   Kathee Delton, MD    Physical Exam: Filed Vitals:   10/10/13 0045 10/10/13 0047 10/10/13 0115 10/10/13 0215  BP: 105/79  95/56 93/58  Pulse: 107  105 99  Temp:      TempSrc:      Resp: 24  23   SpO2: 98% 97% 100% 98%     General:  Well-developed and poorly nourished.  Eyes: Anicteric no pallor.  ENT: No discharge from ears eyes nose mouth  Neck: No mass felt. Elevated JVD.  Cardiovascular: S1-S2 heard.  Respiratory: Bilateral  expiratory wheeze heard no crepitations.  Abdomen: Soft nontender bowel sounds present.  Skin: No rash.  Musculoskeletal: No edema.  Psychiatric: Appears normal.  Neurologic: Alert awake oriented to time place and person. Moves all extremities.  Labs on Admission:  Basic Metabolic Panel:  Recent Labs Lab 10/10/13 0050  NA 133*  K 2.7*  CL 88*  CO2 32  GLUCOSE 118*  BUN 9  CREATININE 0.83  CALCIUM 9.1   Liver Function Tests: No results found for this basename: AST, ALT, ALKPHOS, BILITOT, PROT, ALBUMIN,  in the last 168 hours No results found for this basename: LIPASE, AMYLASE,  in the last 168 hours No results found for this basename: AMMONIA,  in the last 168 hours CBC:  Recent Labs Lab 10/10/13 0050  WBC 14.3*  HGB 14.7  HCT 40.6  MCV 92.3  PLT 221   Cardiac Enzymes: No results found for this basename: CKTOTAL, CKMB, CKMBINDEX, TROPONINI,  in the last 168 hours  BNP (last 3 results) No results found for this basename: PROBNP,  in the last 8760 hours CBG: No results found for this basename: GLUCAP,  in the last 168 hours  Radiological Exams on Admission: Dg Chest 2 View (if Patient Has Fever And/or Copd)  10/10/2013   CLINICAL DATA:  Shortness of breath, cough. History of COPD and right thigh sarcoma.  EXAM: CHEST  2 VIEW  COMPARISON:  NM PET IMAGE INITIAL (PI) SKULL BASE TO THIGH dated 09/29/2011; DG CHEST 2 VIEW dated 08/19/2011  FINDINGS: Cardiomediastinal silhouette is unremarkable. Markedly increased lung volumes with flattening of the hemidiaphragms, and mild chronic interstitial changes. The lungs are otherwise clear without pleural effusions or focal consolidations. Trachea projects midline and there is no pneumothorax. Soft tissue planes and included osseous structures are non-suspicious. Multiple EKG lines overlie the patient and may obscure subtle underlying pathology.  IMPRESSION: Severe COPD without superimposed acute cardiopulmonary process.    Electronically Signed   By: Elon Alas   On: 10/10/2013 01:17    EKG: Independently reviewed. Sinus tachycardia. Poor quality with a lot of artifacts.  Assessment/Plan Principal Problem:   Acute respiratory failure Active Problems:   TOBACCO USER   HYPERTENSION, BENIGN ESSENTIAL   COPD exacerbation   Leiomyosarcoma of Right Thigh   1. Acute respiratory failure secondary to COPD exacerbation - patient has been continued on albuterol and Atrovent nebulizers. Continue Pulmicort. Patient has been placed on Levaquin. IV steroids. We'll monitor in step down overnight. 2. Hypokalemia - probably secondary to diuretics. Replace and recheck. Check Magnesium levels. 3. History of hypertension presently hypotensive - holding antihypertensives for now. 4. Leukocytosis - probably reactionary. Closely follow CBC. Patient is on Levaquin for COPD exacerbation. 5. History  of sarcoma of thigh. 6. Tobacco abuse - patient advised to quit smoking.  I have reviewed patient's old charts and labs.  Code Status: Full code.  Family Communication: Patient's sister at the bedside.  Disposition Plan: Admit to inpatient.    Jason Frisbee N. [facility] Pager [phone].  If 7PM-7AM, please contact night-coverage [email or web address] Password [email or web address] 10/10/2013, 2:46 AM

## 2013-10-10 NOTE — Progress Notes (Signed)
8:45 AM I agree with HPI/GPe and A/P per Dr. Hal Hope      68 y/o ?, known chronic smoker + emphysema/severe COPD goals stage IV/'D', continued chronic smoker~1 tablet a history of right thigh sarcoma S./B. excision 2011, s/p radiation therapy with biopsy right femoral lymph node which was a reactive node, significant anxiety not on medications, reported alcohol use admitted early morning 10/10/13 with decompensated AECOPD Patient placed on BiPAP however was weaned to 4 L nasal cannula  Currently doing well, tolerating diet No other issues at present Not producing sputum Feels much better than prior  Nursing report has been anxious  EOMI, NCAT, frail, S1-S2 slightly tach cardiac, abdomen soft nontender nondistended, diffuse wheeze, no accessory muscle use, neuro grossly intact moving all 4 limbs equally   Patient Active Problem List   Diagnosis Date Noted  . Acute respiratory failure 10/10/2013  . Leiomyosarcoma of Right Thigh 11/17/2012  . COPD exacerbation 06/20/2012  . History of sarcoma of soft tissue 09/20/2011  . 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 ALCOHOL USE 10/22/2009  . TOBACCO USER 10/22/2009  . HYPERTENSION, BENIGN ESSENTIAL 09/25/2009  . COPD (chronic obstructive pulmonary disease) with emphysema 09/25/2009  . LOW BACK PAIN SYNDROME 09/25/2009   Monitor on step down today-likely transfer to telemetry tomorrow. Continue plan as per history of present illness and orders as per admitting physician  Verneita Griffes, MD [facility] [phone]

## 2013-10-10 NOTE — ED Provider Notes (Signed)
CSN: [id number]     Arrival date & time 10/10/13  0032 History   First MD Initiated Contact with Patient 10/10/13 0100     Chief Complaint  Patient presents with  . Shortness of Breath     (Consider location/radiation/quality/duration/timing/severity/associated sxs/prior Treatment) HPI Comments: Patient is a 68 year old male with history of COPD and hypertension. He developed difficulty breathing this evening that was unrelieved with his home nebulizers. EMS was called. He was initially found to be hypoxic with O2 sats of 82%. He was placed on CPAP and given nebulizer treatments. He improved somewhat and route. He denies fevers, chills, or chest pain.  Patient is a 68 y.o. male presenting with shortness of breath. The history is provided by the patient.  Shortness of Breath Severity:  Moderate Onset quality:  Sudden Duration:  2 hours Timing:  Constant Progression:  Worsening Chronicity:  Recurrent Context: activity   Relieved by:  Nothing Worsened by:  Nothing tried Associated symptoms: no abdominal pain, no chest pain, no fever and no hemoptysis     Past Medical History  Diagnosis Date  . Hypertension   . Cancer     skin  . Arthritis   . Low back pain syndrome     Joint Pain  . Emphysema   . S/P radiation therapy 07/23/10 - 09/14/10    Right Thigh for Leiomyosarcoma / 61.2 Gy / 34 Fractions  . Reflux   . Leiomyosarcoma of thigh 2011   Past Surgical History  Procedure Laterality Date  . Laminectomy  2008    Three back surgeries per patient medical history form dated 05/06/10.  Marland Kitchen Elbow surgery  1967    L  . Leg skin lesion  biopsy / excision  04/20/10 / re-excision 06/18/10     Right thigh then re-excision of Lymph node right \\grion  06/18/10   Family History  Problem Relation Age of Onset  . Heart disease Mother     heart attack   History  Substance Use Topics  . Smoking status: Current Every Day Smoker -- 0.15 packs/day for 43 years    Types: Cigarettes    Last  Attempt to Quit: 11/29/2012  . Smokeless tobacco: Never Used     Comment: Pt states QUIT 11/29/12-2 ppd.   . Alcohol Use: 10.8 oz/week    18 Cans of beer per week     Comment: Consumption per week.    Review of Systems  Constitutional: Negative for fever.  Respiratory: Positive for shortness of breath. Negative for hemoptysis.   Cardiovascular: Negative for chest pain.  Gastrointestinal: Negative for abdominal pain.  All other systems reviewed and are negative.      Allergies  Review of patient's allergies indicates no known allergies.  Home Medications   Current Outpatient Rx  Name  Route  Sig  Dispense  Refill  . albuterol (PROAIR HFA) 108 (90 BASE) MCG/ACT inhaler      INHALE 2 PUFFS BY MOUTH EVERY 4 TO 6 HOURS AS NEEDED   8.5 g   11   . aspirin 81 MG tablet   Oral   Take 1 tablet (81 mg total) by mouth daily.   30 tablet   6   . budesonide (PULMICORT) 0.5 MG/2ML nebulizer solution   Nebulization   Take 2 mLs (0.5 mg total) by nebulization 2 (two) times daily.   60 mL   11   . carbamide peroxide (DEBROX) 6.5 % otic solution   Left Ear   Place 10  drops into the left ear as needed. For 4 days         . dextromethorphan-guaiFENesin (MUCINEX DM) 30-600 MG per 12 hr tablet   Oral   Take 1 tablet by mouth every 12 (twelve) hours.         Marland Kitchen ipratropium-albuterol (DUONEB) 0.5-2.5 (3) MG/3ML SOLN   Nebulization   Take 3 mLs by nebulization 4 (four) times daily. Can use an additional 2 treatments if needed.   360 mL   6   . lisinopril-hydrochlorothiazide (PRINZIDE,ZESTORETIC) 20-25 MG per tablet      TAKE 1 TABLET BY MOUTH DAILY   90 tablet   3   . Multiple Vitamins-Minerals (CENTRUM SILVER PO)   Oral   Take by mouth daily.         . naproxen sodium (ANAPROX) 220 MG tablet   Oral   Take 220 mg by mouth as needed.         . predniSONE (DELTASONE) 10 MG tablet      Take 4 tabs daily x 2 days, 3 tabs daily x 2 days, 2 tabs daily x 2 days, 1 tab  daily x 2 days then stop   20 tablet   0    BP 94/62  Pulse 109  Temp(Src) 98.2 F (36.8 C) (Oral)  Resp 24  SpO2 97% Physical Exam  Vitals reviewed. Constitutional: He is oriented to person, place, and time.  Patient is a thin male. He is awake and alert.  HENT:  Head: Normocephalic and atraumatic.  Mouth/Throat: Oropharynx is clear and moist.  Neck: Normal range of motion. Neck supple.  Cardiovascular: Normal rate, regular rhythm and normal heart sounds.   No murmur heard. Pulmonary/Chest: He has no rales. He exhibits no tenderness.  There are bilateral rhonchi present. He is in moderate respiratory distress.  Abdominal: Soft. Bowel sounds are normal.  Musculoskeletal: Normal range of motion. He exhibits no edema.  Neurological: He is alert and oriented to person, place, and time.  Skin: Skin is warm and dry.    ED Course  Procedures (including critical care time) Labs Review Labs Reviewed  CBC - Abnormal; Notable for the following:    WBC 14.3 (*)    MCHC 36.2 (*)    All other components within normal limits  I-STAT ARTERIAL BLOOD GAS, ED - Abnormal; Notable for the following:    pCO2 arterial 50.9 (*)    pO2, Arterial 27.0 (*)    Bicarbonate 33.0 (*)    Acid-Base Excess 7.0 (*)    All other components within normal limits  BASIC METABOLIC PANEL  I-STAT TROPOININ, ED   Imaging Review Dg Chest 2 View (if Patient Has Fever And/or Copd)  10/10/2013   CLINICAL DATA:  Shortness of breath, cough. History of COPD and right thigh sarcoma.  EXAM: CHEST  2 VIEW  COMPARISON:  NM PET IMAGE INITIAL (PI) SKULL BASE TO THIGH dated 09/29/2011; DG CHEST 2 VIEW dated 08/19/2011  FINDINGS: Cardiomediastinal silhouette is unremarkable. Markedly increased lung volumes with flattening of the hemidiaphragms, and mild chronic interstitial changes. The lungs are otherwise clear without pleural effusions or focal consolidations. Trachea projects midline and there is no pneumothorax. Soft tissue  planes and included osseous structures are non-suspicious. Multiple EKG lines overlie the patient and may obscure subtle underlying pathology.  IMPRESSION: Severe COPD without superimposed acute cardiopulmonary process.   Electronically Signed   By: Elon Alas   On: 10/10/2013 01:17     Date: 10/10/2013  Rate: 108  Rhythm: sinus tachycardia  QRS Axis: left  Intervals: normal  ST/T Wave abnormalities: normal  Conduction Disutrbances:none  Narrative Interpretation:   Old EKG Reviewed: unchanged    MDM   Final diagnoses:  None    Patient is a 68 year old male with history of COPD. He was brought here by EMS for evaluation of difficulty breathing. He was initially placed on BiPAP due to the severity of the symptoms. When he arrived here he was placed on a nasal cannula. He was given duo nebs x3 along with steroids, however is not feeling much better. He continues to have an oxygen requirement and I feel will require admission. I've spoken with Dr. Hal Hope about this patient. He agrees to accept the admission.    Veryl Speak, MD 10/10/13 [phone]

## 2013-10-10 NOTE — Progress Notes (Signed)
68yo male called 911 for SOB, CXR shows severe COPD w/o acute cardiopulmonary process, now to begin IV ABX for COPD exacerbation.  Will start Levaquin 500mg  IV Q24H for CrCl ~60 ml/min and monitor CBC and Cx.  Wynona Neat, PharmD, BCPS 10/10/2013 3:31 AM

## 2013-10-11 LAB — COMPREHENSIVE METABOLIC PANEL
ALBUMIN: 3.5 g/dL (ref 3.5–5.2)
ALT: 16 U/L (ref 0–53)
AST: 20 U/L (ref 0–37)
Alkaline Phosphatase: 55 U/L (ref 39–117)
BILIRUBIN TOTAL: 0.5 mg/dL (ref 0.3–1.2)
BUN: 12 mg/dL (ref 6–23)
CHLORIDE: 90 meq/L — AB (ref 96–112)
CO2: 31 mEq/L (ref 19–32)
CREATININE: 0.58 mg/dL (ref 0.50–1.35)
Calcium: 9.2 mg/dL (ref 8.4–10.5)
GFR calc non Af Amer: >90 mL/min (ref >90)
Glucose, Bld: 130 mg/dL — ABNORMAL HIGH (ref 70–99)
Potassium: 3.9 mEq/L (ref 3.7–5.3)
Sodium: 133 mEq/L — ABNORMAL LOW (ref 137–147)
Total Protein: 6.5 g/dL (ref 6.0–8.3)

## 2013-10-11 LAB — CBC
HEMATOCRIT: 38.2 % — AB (ref 39.0–52.0)
Hemoglobin: 13.6 g/dL (ref 13.0–17.0)
MCH: 33 pg (ref 26.0–34.0)
MCHC: 35.6 g/dL (ref 30.0–36.0)
MCV: 92.7 fL (ref 78.0–100.0)
Platelets: 196 10*3/uL (ref 150–400)
RBC: 4.12 MIL/uL — ABNORMAL LOW (ref 4.22–5.81)
RDW: 12.3 % (ref 11.5–15.5)
WBC: 15 10*3/uL — AB (ref 4.0–10.5)

## 2013-10-11 MED ORDER — LORAZEPAM 2 MG/ML IJ SOLN
0.5000 mg | Freq: Once | INTRAMUSCULAR | Status: AC
  Administered 2013-10-11: 1 mg via INTRAVENOUS

## 2013-10-11 MED ORDER — LORAZEPAM 2 MG/ML IJ SOLN
INTRAMUSCULAR | Status: AC
  Administered 2013-10-11: 1 mg via INTRAVENOUS
  Filled 2013-10-11: qty 1

## 2013-10-11 MED ORDER — HALOPERIDOL LACTATE 5 MG/ML IJ SOLN
2.5000 mg | Freq: Once | INTRAMUSCULAR | Status: AC
  Administered 2013-10-11: 2.5 mg via INTRAVENOUS
  Filled 2013-10-11: qty 1

## 2013-10-11 MED ORDER — ALBUTEROL SULFATE HFA 108 (90 BASE) MCG/ACT IN AERS: 1.0000 | INHALATION_SPRAY | Freq: Four times a day (QID) | RESPIRATORY_TRACT | Status: DC

## 2013-10-11 MED ORDER — NICOTINE 21 MG/24HR TD PT24
21.0000 mg | MEDICATED_PATCH | Freq: Every day | TRANSDERMAL | Status: DC
  Administered 2013-10-12 – 2013-10-13 (×2): 21 mg via TRANSDERMAL
  Filled 2013-10-11 (×4): qty 1

## 2013-10-11 MED ORDER — TIOTROPIUM BROMIDE MONOHYDRATE 18 MCG IN CAPS
18.0000 ug | ORAL_CAPSULE | Freq: Every day | RESPIRATORY_TRACT | Status: DC
  Administered 2013-10-11 – 2013-10-13 (×3): 18 ug via RESPIRATORY_TRACT
  Filled 2013-10-11: qty 5

## 2013-10-11 MED ORDER — BUSPIRONE HCL 5 MG PO TABS
5.0000 mg | ORAL_TABLET | Freq: Three times a day (TID) | ORAL | Status: DC
  Administered 2013-10-11 – 2013-10-13 (×7): 5 mg via ORAL
  Filled 2013-10-11 (×10): qty 1

## 2013-10-11 MED ORDER — ALBUTEROL SULFATE 2 MG PO TABS
2.0000 mg | ORAL_TABLET | Freq: Two times a day (BID) | ORAL | Status: DC
  Administered 2013-10-11 – 2013-10-13 (×4): 2 mg via ORAL
  Filled 2013-10-11 (×6): qty 1

## 2013-10-11 MED ORDER — NICOTINE POLACRILEX 2 MG MT GUM
2.0000 mg | CHEWING_GUM | OROMUCOSAL | Status: DC | PRN
  Administered 2013-10-11: 2 mg via ORAL
  Filled 2013-10-11: qty 1

## 2013-10-11 NOTE — Progress Notes (Signed)
10/11/2013 12:31 PM PRN neb given for wheezing and increased WOB. Extremely anxious  Bryona Foxworthy, Carolynn Comment

## 2013-10-11 NOTE — Progress Notes (Signed)
10/11/2013 5:39 PM Anxious SOB, PRN NEb given. Brandy Kabat, Carolynn Comment

## 2013-10-11 NOTE — Progress Notes (Addendum)
Note: This document was prepared with digital dictation and possible smart phrase technology. Any transcriptional errors that result from this process are unintentional.   Hector House:[id number] DOB: 04-23-1946 DOA: 10/10/2013 PCP: Pcp Not In System  Brief narrative: 68 y/o ?, known chronic smoker + emphysema/severe COPD goals stage IV/'D', continued chronic smoker~1ppd + history of right thigh sarcoma s/p excision 2011, s/p radiation therapy with biopsy right femoral lymph node which was a reactive node, significant anxiety not on medications, reported alcohol use admitted early morning 10/10/13 with decompensated AECOPD  Patient initally in ED placed on BiPAP however was weaned to 4 L nasal cannula   Consultants:  None-Notified primary pulmonologist by EPIC  Procedures:  CXR  Antibiotics:   Levaquin 3/4-3/5   Subjective  Nursing reports very anxious overnight Pulling at leads, wires, O2 Reported to not be eating much Taking ensure well-reports might not be able to afford some meds.  Contrasted this to him with the cost of a carton of cigarrettes ~40 $  Now sleep as has received Haldiol + ativan   Labs pending from this am as just ordered Tolerating diet   Objective    Interim History: See above  Telemetry: Sinus   Objective: Filed Vitals:   10/11/13 0600 10/11/13 0605 10/11/13 0700 10/11/13 0731  BP: 82/53 82/51 83/53  95/66  Pulse: 73 72 73   Temp:      TempSrc:      Resp:      Height:      Weight:      SpO2: 98% 99% 98% 98%    Intake/Output Summary (Last 24 hours) at 10/11/13 0751 Last data filed at 10/11/13 0400  Gross per 24 hour  Intake    880 ml  Output    801 ml  Net     79 ml    Exam:  General: frail, EOMI Body mass index is 14.49 kg/(m^2). Cardiovascular: s1 s2 no m/r/g Respiratory: clear, no wheeze now, no TVR/TVF Abdomen: soft Skin Tattoo r forearm Neuro intact but sleepy  Data Reviewed: Basic Metabolic Panel:  Recent  Labs Lab 10/10/13 0050 10/10/13 0520  NA 133* 129*  K 2.7* 3.2*  CL 88* 87*  CO2 32 29  GLUCOSE 118* 162*  BUN 9 9  CREATININE 0.83 0.63  CALCIUM 9.1 8.6   Liver Function Tests:  Recent Labs Lab 10/10/13 0520  AST 18  ALT 16  ALKPHOS 63  BILITOT 0.4  PROT 6.4  ALBUMIN 3.7   No results found for this basename: LIPASE, AMYLASE,  in the last 168 hours No results found for this basename: AMMONIA,  in the last 168 hours CBC:  Recent Labs Lab 10/10/13 0050 10/10/13 0520  WBC 14.3* 12.9*  NEUTROABS  --  12.5*  HGB 14.7 13.4  HCT 40.6 37.5*  MCV 92.3 92.1  PLT 221 212   Cardiac Enzymes:  Recent Labs Lab 10/10/13 0520  TROPONINI <0.30   BNP: No components found with this basename: POCBNP,  CBG: No results found for this basename: GLUCAP,  in the last 168 hours  Recent Results (from the past 240 hour(s))  MRSA PCR SCREENING     Status: None   Collection Time    10/10/13  3:28 AM      Result Value Ref Range Status   MRSA by PCR NEGATIVE  NEGATIVE Final   Comment:            The GeneXpert MRSA Assay (FDA  approved for NASAL specimens     only), is one component of a     comprehensive MRSA colonization     surveillance program. It is not     intended to diagnose MRSA     infection nor to guide or     monitor treatment for     MRSA infections.     Studies:              All Imaging reviewed and is as per above notation   Scheduled Meds: . albuterol  2 mg Oral BID  . aspirin  81 mg Oral Daily  . budesonide  0.5 mg Nebulization BID  . busPIRone  5 mg Oral TID  . dextromethorphan-guaiFENesin  1 tablet Oral Q12H  . enoxaparin (LOVENOX) injection  30 mg Subcutaneous Q24H  . ipratropium-albuterol  3 mL Nebulization Q6H  . methylPREDNISolone (SOLU-MEDROL) injection  40 mg Intravenous Q8H  . sodium chloride  3 mL Intravenous Q12H   Continuous Infusions: . sodium chloride Stopped (10/10/13 1300)   acetaminophen, acetaminophen, albuterol, ondansetron  (ZOFRAN) IV, ondansetron   Assessment/Plan: 1. AECOPD, GOLD stage 'C'-continue Albuterol nebs 2.5 q 2 prn, Pulmicort 0.5 bid.  Transition Duoneb to Spiriva.  Added Albuterol Tabs bid.   Transition IV Solu-mexrol in am to steroid burst 60 for 5 days.  As no sputum, d/c Levaquin as per GOLD guidelines. Needs close follow-up.  Will notify Dr. Gwenette Greet of [place]. 2. Chronic smoker-educated.  Ordered patch, Gum.  Doubtfull he can quit without significant assistance. 3. Anxiety-Likely unipolar.  Startedd Bupsar 5 mg tid.  DO NOT USE BZD's 4. Prior h/o thigh Sarcoma-Stable-OP folow up with gen surg for interval screening 5. Hypotension-2/2 to habitus-asymtpomatic and was asleep at that time 6. Leukocytosis and impaired glucose toelrance 2/2 to Steroids 7. Severe Protein energy Malnutrition-Continue supplements with ensure 8. Needs interval screening for Lung cancer given habitus as well as chronic smoking.  Code Status: Full Family Communication:  None + Disposition Plan:  SDU today and transfer tele later today?   Verneita Griffes, MD  [facility] Pager [phone] 10/11/2013, 7:51 AM    LOS: 1 day

## 2013-10-11 NOTE — Progress Notes (Signed)
INITIAL NUTRITION ASSESSMENT  DOCUMENTATION CODES Per approved criteria  -Severe malnutrition in the context of chronic illness- per MD note   INTERVENTION: 1.  Supplements; Ensure Complete po BID, each supplement provides 350 kcal and 13 grams of protein 1.  General healthful diet; encourage intake of foods and beverages as able.  Discussed providing additional beverages with nursing to provide calories with lower fatigue. RD to follow and assess for nutritional adequacy.    NUTRITION DIAGNOSIS: Inadequate oral intake related to increased work of breathing as evidenced by pt with respiratory distress and fatigue.   Monitor:  1.  Food/Beverage; pt meeting >/=90% estimated needs with tolerance. 2.  Wt/wt change; monitor trends  Reason for Assessment: MST  68 y.o. male  Admitting Dx: Acute respiratory failure  ASSESSMENT: Pt admitted with acute respiratory failure.  Pt with COPD at baseline.  Pt sleeping at time of visit. RN requests RD not wake patient at this time due to severe anxiety this afternoon.  Pt has admitted to poor intake at home. Stated he could not afford Ensure at home but is willing to drink it.  Pt working with case manager to enroll in home meal delivery and to find affordable medications/supplements.   Recommend prescription from MD for Ensure Complete with specifications of how often and what duration. Pt would benefit from twice a day for 1 month (with refills). Not sure if pt's insurance participates in coverage for Ensure written as a prescription, however pt can still be provided with coupons regardless.   Height: Ht Readings from Last 1 Encounters:  10/10/13 5\' 11"  (1.803 m)    Weight: Wt Readings from Last 1 Encounters:  10/11/13 103 lb 13.4 oz (47.1 kg)    Ideal Body Weight: 78.1 kg  % Ideal Body Weight: 60%  Wt Readings from Last 10 Encounters:  10/11/13 103 lb 13.4 oz (47.1 kg)  06/19/13 105 lb 3.2 oz (47.718 kg)  03/19/13 106 lb 6.4 oz  (48.263 kg)  12/20/12 112 lb (50.803 kg)  11/17/12 109 lb 14.4 oz (49.85 kg)  06/20/12 112 lb 6.4 oz (50.984 kg)  04/04/12 109 lb 3.2 oz (49.533 kg)  03/16/12 111 lb 4 oz (50.463 kg)  10/11/11 113 lb (51.256 kg)  09/20/11 113 lb (51.256 kg)    Usual Body Weight: 110 lbs  % Usual Body Weight: 93%  BMI:  Body mass index is 14.49 kg/(m^2).  Estimated Nutritional Needs: Kcal: 1500-1700 Protein: 70-90g Fluid: ~1.8 L/day  Skin: intact  Diet Order: Cardiac  EDUCATION NEEDS: -Education not appropriate at this time   Intake/Output Summary (Last 24 hours) at 10/11/13 1338 Last data filed at 10/11/13 1300  Gross per 24 hour  Intake   1060 ml  Output    801 ml  Net    259 ml    Last BM: 3/4  Labs:   Recent Labs Lab 10/10/13 0050 10/10/13 0520 10/11/13 0845  NA 133* 129* 133*  K 2.7* 3.2* 3.9  CL 88* 87* 90*  CO2 32 29 31  BUN 9 9 12   CREATININE 0.83 0.63 0.58  CALCIUM 9.1 8.6 9.2  GLUCOSE 118* 162* 130*    CBG (last 3)  No results found for this basename: GLUCAP,  in the last 72 hours  Scheduled Meds: . albuterol  2 mg Oral BID  . aspirin  81 mg Oral Daily  . budesonide  0.5 mg Nebulization BID  . busPIRone  5 mg Oral TID  . dextromethorphan-guaiFENesin  1 tablet  Oral Q12H  . enoxaparin (LOVENOX) injection  30 mg Subcutaneous Q24H  . methylPREDNISolone (SOLU-MEDROL) injection  40 mg Intravenous Q8H  . nicotine  21 mg Transdermal Daily  . sodium chloride  3 mL Intravenous Q12H  . tiotropium  18 mcg Inhalation Daily    Continuous Infusions: . sodium chloride Stopped (10/10/13 1300)    Past Medical History  Diagnosis Date  . Hypertension   . Cancer     skin  . Arthritis   . Low back pain syndrome     Joint Pain  . Emphysema   . S/P radiation therapy 07/23/10 - 09/14/10    Right Thigh for Leiomyosarcoma / 61.2 Gy / 34 Fractions  . Reflux   . Leiomyosarcoma of thigh 2011    Past Surgical History  Procedure Laterality Date  . Laminectomy  2008     Three back surgeries per patient medical history form dated 05/06/10.  Marland Kitchen Elbow surgery  1967    L  . Leg skin lesion  biopsy / excision  04/20/10 / re-excision 06/18/10     Right thigh then re-excision of Lymph node right \\grion  06/18/10    Brynda Greathouse, MS RD LDN Clinical Inpatient Dietitian Pager: [phone] Weekend/After hours pager: [phone]

## 2013-10-12 LAB — COMPREHENSIVE METABOLIC PANEL
ALK PHOS: 57 U/L (ref 39–117)
ALT: 20 U/L (ref 0–53)
AST: 23 U/L (ref 0–37)
Albumin: 3.4 g/dL — ABNORMAL LOW (ref 3.5–5.2)
BUN: 16 mg/dL (ref 6–23)
CO2: 32 mEq/L (ref 19–32)
Calcium: 9 mg/dL (ref 8.4–10.5)
Chloride: 91 mEq/L — ABNORMAL LOW (ref 96–112)
Creatinine, Ser: 0.54 mg/dL (ref 0.50–1.35)
GFR calc Af Amer: >90 mL/min (ref >90)
GFR calc non Af Amer: >90 mL/min (ref >90)
GLUCOSE: 121 mg/dL — AB (ref 70–99)
POTASSIUM: 3.7 meq/L (ref 3.7–5.3)
SODIUM: 135 meq/L — AB (ref 137–147)
TOTAL PROTEIN: 5.8 g/dL — AB (ref 6.0–8.3)
Total Bilirubin: 0.2 mg/dL — ABNORMAL LOW (ref 0.3–1.2)

## 2013-10-12 LAB — PHOSPHORUS: Phosphorus: 2.4 mg/dL (ref 2.3–4.6)

## 2013-10-12 LAB — MAGNESIUM: Magnesium: 2 mg/dL (ref 1.5–2.5)

## 2013-10-12 MED ORDER — ASPIRIN 81 MG PO CHEW: 81.0000 mg | CHEWABLE_TABLET | Freq: Every day | ORAL | Status: DC

## 2013-10-12 MED ORDER — ALBUTEROL SULFATE HFA 108 (90 BASE) MCG/ACT IN AERS: 2.0000 | INHALATION_SPRAY | RESPIRATORY_TRACT | Status: DC | PRN

## 2013-10-12 MED ORDER — PREDNISONE 10 MG PO TABS
60.0000 mg | ORAL_TABLET | Freq: Every day | ORAL | Status: DC
Start: 2013-10-12 — End: 2013-10-13
  Administered 2013-10-12 – 2013-10-13 (×2): 60 mg via ORAL
  Filled 2013-10-12 (×4): qty 1

## 2013-10-12 MED ORDER — ASPIRIN 81 MG PO TABS: 81.0000 mg | ORAL_TABLET | Freq: Every day | ORAL | Status: DC

## 2013-10-12 MED ORDER — LISINOPRIL 20 MG PO TABS
20.0000 mg | ORAL_TABLET | Freq: Every day | ORAL | Status: DC
  Administered 2013-10-12 – 2013-10-13 (×2): 20 mg via ORAL
  Filled 2013-10-12 (×2): qty 1

## 2013-10-12 MED ORDER — SERTRALINE HCL 25 MG PO TABS
25.0000 mg | ORAL_TABLET | Freq: Every day | ORAL | Status: DC
  Administered 2013-10-12 – 2013-10-13 (×2): 25 mg via ORAL
  Filled 2013-10-12 (×2): qty 1

## 2013-10-12 MED ORDER — BUDESONIDE 0.5 MG/2ML IN SUSP: 0.5000 mg | Freq: Two times a day (BID) | RESPIRATORY_TRACT | Status: DC

## 2013-10-12 MED ORDER — HYDROCHLOROTHIAZIDE 25 MG PO TABS
25.0000 mg | ORAL_TABLET | Freq: Every day | ORAL | Status: DC
  Administered 2013-10-12 – 2013-10-13 (×2): 25 mg via ORAL
  Filled 2013-10-12 (×2): qty 1

## 2013-10-12 MED ORDER — LISINOPRIL-HYDROCHLOROTHIAZIDE 20-25 MG PO TABS: 1.0000 | ORAL_TABLET | Freq: Every day | ORAL | Status: DC

## 2013-10-12 NOTE — Progress Notes (Signed)
Note: This document was prepared with digital dictation and possible smart phrase technology. Any transcriptional errors that result from this process are unintentional.   Hector House NGE:[id number] DOB: 06/07/1946 DOA: 10/10/2013 PCP: Pcp Not In System  Brief narrative: 68 y/o ?, known chronic smoker + emphysema/severe COPD goals stage IV/'D', continued chronic smoker~1ppd + history of right thigh sarcoma s/p excision 2011, s/p radiation therapy with biopsy right femoral lymph node which was a reactive node, significant anxiety not on medications, reported alcohol use admitted early morning 10/10/13 with decompensated AECOPD  Patient initally in ED placed on BiPAP however was weaned to 4 L nasal cannula   Consultants:  None-Notified primary pulmonologist by EPIC  Procedures:  CXR  Antibiotics:   Levaquin 3/4-3/5   Subjective   Anxious, multiple family members at bedside Wheezing still Feels much better than prior however Tolerating diet Spent more than 15 minutes explaining that his anxiety and COPD are interlinked but that overuse of his inhaler when he is anxious may cause him to have tachycardia   Objective    Interim History: See above  Telemetry: Sinus   Objective: Filed Vitals:   10/12/13 0736 10/12/13 0952 10/12/13 1000 10/12/13 1149  BP: 128/60   120/73  Pulse: 89  76 97  Temp: 98.7 F (37.1 C)   98.3 F (36.8 C)  TempSrc: Oral   Oral  Resp: 18     Height:      Weight:      SpO2: 94% 94% 100% 88%    Intake/Output Summary (Last 24 hours) at 10/12/13 1453 Last data filed at 10/12/13 1100  Gross per 24 hour  Intake    600 ml  Output    550 ml  Net     50 ml    Exam:  General: frail, EOMI Body mass index is 14.12 kg/(m^2). Cardiovascular: s1 s2 no m/r/g Respiratory: clear, wheeze noted bilaterally Abdomen: soft Skin Tattoo r forearm Neuro intact but sleepy  Data Reviewed: Basic Metabolic Panel:  Recent Labs Lab 10/10/13 0050  10/10/13 0520 10/11/13 0845 10/12/13 0210  NA 133* 129* 133* 135*  K 2.7* 3.2* 3.9 3.7  CL 88* 87* 90* 91*  CO2 32 29 31 32  GLUCOSE 118* 162* 130* 121*  BUN 9 9 12 16   CREATININE 0.83 0.63 0.58 0.54  CALCIUM 9.1 8.6 9.2 9.0  MG  --   --   --  2.0  PHOS  --   --   --  2.4   Liver Function Tests:  Recent Labs Lab 10/10/13 0520 10/11/13 0845 10/12/13 0210  AST 18 20 23   ALT 16 16 20   ALKPHOS 63 55 57  BILITOT 0.4 0.5 0.2*  PROT 6.4 6.5 5.8*  ALBUMIN 3.7 3.5 3.4*   No results found for this basename: LIPASE, AMYLASE,  in the last 168 hours No results found for this basename: AMMONIA,  in the last 168 hours CBC:  Recent Labs Lab 10/10/13 0050 10/10/13 0520 10/11/13 0845  WBC 14.3* 12.9* 15.0*  NEUTROABS  --  12.5*  --   HGB 14.7 13.4 13.6  HCT 40.6 37.5* 38.2*  MCV 92.3 92.1 92.7  PLT 221 212 196   Cardiac Enzymes:  Recent Labs Lab 10/10/13 0520  TROPONINI <0.30   BNP: No components found with this basename: POCBNP,  CBG: No results found for this basename: GLUCAP,  in the last 168 hours  Recent Results (from the past 240 hour(s))  MRSA PCR SCREENING  Status: None   Collection Time    10/10/13  3:28 AM      Result Value Ref Range Status   MRSA by PCR NEGATIVE  NEGATIVE Final   Comment:            The GeneXpert MRSA Assay (FDA     approved for NASAL specimens     only), is one component of a     comprehensive MRSA colonization     surveillance program. It is not     intended to diagnose MRSA     infection nor to guide or     monitor treatment for     MRSA infections.     Studies:              All Imaging reviewed and is as per above notation   Scheduled Meds: . albuterol  2 mg Oral BID  . aspirin  81 mg Oral Daily  . budesonide  0.5 mg Nebulization BID  . busPIRone  5 mg Oral TID  . dextromethorphan-guaiFENesin  1 tablet Oral Q12H  . enoxaparin (LOVENOX) injection  30 mg Subcutaneous Q24H  . methylPREDNISolone (SOLU-MEDROL) injection   40 mg Intravenous Q8H  . nicotine  21 mg Transdermal Daily  . sodium chloride  3 mL Intravenous Q12H  . tiotropium  18 mcg Inhalation Daily   Continuous Infusions: . sodium chloride Stopped (10/10/13 1300)   acetaminophen, acetaminophen, albuterol, nicotine polacrilex, ondansetron (ZOFRAN) IV, ondansetron   Assessment/Plan: 1. AECOPD, GOLD stage 'C'-continue Albuterol nebs 2.5 q 2 prn, Pulmicort 0.5 bid.  Transition Duoneb.  Started Spiriva and this admission.  Added Albuterol Tabs bid.   Transition IV Solu-mexrol in am to steroid burst 60 for 5 days.  As no sputum, d/c Levaquin as per GOLD guidelines. Needs close follow-up.  Will notify Dr. Gwenette Greet of Admit.-States that his inhaler/nebulizer machine does not work correctly therefore home health has been alert to this 2. Chronic smoker-educated.  Ordered patch, Gum.  Doubtfull he can quit without significant assistance. 3. Anxiety-Likely unipolar.  Started Bupsar 5 mg tid.  DO NOT USE BZD's very long discussion with sister at bedside who uses Zoloft as well as bupropion and agreeable to low-dose Zoloft 25 mg daily in addition-breathing exercises and relaxation techniques shared with patient-we'll get social worker to help teach her some of these exercises 4. Prior h/o thigh Sarcoma-Stable-OP folow up with gen surg for interval screening 5. Hypotension-2/2 to habitus-asymtpomatic and was asleep at that time 6. Leukocytosis and impaired glucose toelrance 2/2 to Steroids 7. Severe Protein energy Malnutrition-Continue supplements with ensure 8. Needs interval screening for Lung cancer given habitus as well as chronic smoking-defer to pulmonologist  Code Status: Full Family Communication:   discussed with multiple family members today 3/6 Disposition Plan:  Transfer to telemetry   Verneita Griffes, MD  [facility] Pager [phone] 10/12/2013, 2:53 PM    LOS: 2 days

## 2013-10-13 DIAGNOSIS — J441 Chronic obstructive pulmonary disease with (acute) exacerbation: Secondary | ICD-10-CM

## 2013-10-13 MED ORDER — NICOTINE POLACRILEX 2 MG MT GUM: 2.0000 mg | CHEWING_GUM | OROMUCOSAL | Status: DC | PRN

## 2013-10-13 MED ORDER — SERTRALINE HCL 25 MG PO TABS: 25.0000 mg | ORAL_TABLET | Freq: Every day | ORAL | Status: DC

## 2013-10-13 MED ORDER — PREDNISONE 20 MG PO TABS: 60.0000 mg | ORAL_TABLET | Freq: Every day | ORAL | Status: DC

## 2013-10-13 MED ORDER — NICOTINE 21 MG/24HR TD PT24: 21.0000 mg | MEDICATED_PATCH | Freq: Every day | TRANSDERMAL | Status: DC

## 2013-10-13 MED ORDER — BUSPIRONE HCL 5 MG PO TABS: 5.0000 mg | ORAL_TABLET | Freq: Three times a day (TID) | ORAL | Status: DC

## 2013-10-13 NOTE — Discharge Summary (Signed)
Physician Discharge Summary  Hector House XNA:[id number] DOB: 23-Feb-1946 DOA: 10/10/2013  PCP: Pcp Not In System  Admit date: 10/10/2013 Discharge date: 10/13/2013  Time spent: 35 minutes  Recommendations for Outpatient Follow-up:  1. Follow up c Dr. Gwenette Greet  Discharge Diagnoses:  Principal Problem:   Acute respiratory failure Active Problems:   TOBACCO USER   HYPERTENSION, BENIGN ESSENTIAL   COPD exacerbation   Leiomyosarcoma of Right Thigh   Discharge Condition: stable  Diet recommendation: regular  Filed Weights   10/11/13 0400 10/12/13 0342 10/12/13 1952  Weight: 47.1 kg (103 lb 13.4 oz) 45.9 kg (101 lb 3.1 oz) 47.492 kg (104 lb 11.2 oz)    History of present illness:  68 y/o ?, known chronic smoker + emphysema/severe COPD goals stage IV/'D', continued chronic smoker~1ppd + history of right thigh sarcoma s/p excision 2011, s/p radiation therapy with biopsy right femoral lymph node which was a reactive node, significant anxiety not on medications, reported alcohol use admitted early morning 10/10/13 with decompensated AECOPD  Patient initally in ED placed on BiPAP however was weaned to 4 L nasal cannula   Hospital Course:   1. AECOPD, GOLD stage 'C'-continue Albuterol nebs 2.5 q 2 prn, Pulmicort 0.5 bid. Transition Duoneb. Started Spiriva and this admission. Added Albuterol Tabs bid. Transitioned IV Solu-medrolto steroid burst 60 for 5 days. As no sputum, d/c Levaquin as per GOLD guidelines. Needs close follow-up. Will notify Dr. Gwenette Greet of [place]-States that his inhaler/nebulizer machine does not work correctly therefore home health has been alerted to this 2. Chronic smoker-educated. Ordered patch, Gum. Doubtfull he can quit without significant assistance. 3. Anxiety-Likely unipolar. Started Bupsar 5 mg tid. DO NOT USE BZD's very long discussion with sister at bedside who uses Zoloft as well as bupropion and agreeable to low-dose Zoloft 25 mg daily in addition-breathing exercises and  relaxation techniques shared with patient-we'll get social worker to help teach her some of these exercises 4. Prior h/o thigh Sarcoma-Stable-OP folow up with gen surg for interval screening 5. Hypotension-2/2 to habitus-asymtpomatic and was asleep at that time 6. Leukocytosis and impaired glucose toelrance 2/2 to Steroids 7. Severe Protein energy Malnutrition-Continue supplements with ensure 8. Needs interval screening for Lung cancer given habitus as well as chronic smoking-defer to pulmonologist   Consultants:  None-Notified primary pulmonologist by EPIC Procedures:  CXR Antibiotics:  Levaquin 3/4-3/5  Discharge Exam: Filed Vitals:   10/13/13 0428  BP: 118/73  Pulse: 81  Temp: 97.2 F (36.2 C)  Resp: 18    General: alert pleasant Cardiovascular: s1 s2 no m/r/g Respiratory: clear  Discharge Instructions  Discharge Orders   Future Appointments Provider Department Dept Phone   10/17/2013 2:00 PM Kathee Delton, MD [facility] [phone]   11/16/2013 4:00 PM Eppie Gibson, MD [facility] [phone]   Future Orders Complete By Expires   Diet - low sodium heart healthy  As directed    Discharge instructions  As directed    Comments:     Continue Nebs Continue buspar and sertraline-see a Counsellor or psychiatrist of your choosing for anxiety-Your sister mentioned seeing Dr. Alyson Ingles which is think is a great idea Please try not to smoke-you have been given both patches + gum   Increase activity slowly  As directed        Medication List         albuterol 108 (90 BASE) MCG/ACT inhaler  Commonly known as:  PROVENTIL HFA;VENTOLIN HFA  Inhale 2 puffs  into the lungs every 4 (four) hours as needed for shortness of breath.     albuterol 108 (90 BASE) MCG/ACT inhaler  Commonly known as:  PROVENTIL HFA;VENTOLIN HFA  Inhale 2 puffs into the lungs every 4 (four) hours as needed for shortness of breath. INHALE 2 PUFFS BY MOUTH EVERY 4  TO 6 HOURS AS NEEDED     aspirin 81 MG tablet  Take 81 mg by mouth daily.     budesonide 0.5 MG/2ML nebulizer solution  Commonly known as:  PULMICORT  Take 0.5 mg by nebulization 2 (two) times daily.     busPIRone 5 MG tablet  Commonly known as:  BUSPAR  Take 1 tablet (5 mg total) by mouth 3 (three) times daily.     CENTRUM SILVER PO  Take 1 tablet by mouth daily.     ipratropium-albuterol 0.5-2.5 (3) MG/3ML Soln  Commonly known as:  DUONEB  Take 3 mLs by nebulization 4 (four) times daily. Can use an additional 2 treatments if needed.     lisinopril-hydrochlorothiazide 20-25 MG per tablet  Commonly known as:  PRINZIDE,ZESTORETIC  Take 1 tablet by mouth daily.     nicotine 21 mg/24hr patch  Commonly known as:  NICODERM CQ - dosed in mg/24 hours  Place 1 patch (21 mg total) onto the skin daily.     nicotine polacrilex 2 MG gum  Commonly known as:  NICORETTE  Take 1 each (2 mg total) by mouth as needed for smoking cessation.     predniSONE 20 MG tablet  Commonly known as:  DELTASONE  Take 3 tablets (60 mg total) by mouth daily before breakfast.     sertraline 25 MG tablet  Commonly known as:  ZOLOFT  Take 1 tablet (25 mg total) by mouth daily.       No Known Allergies     Follow-up Information   Follow up with senior services mobile meals.   Contact information:   sen[facility]  [phone]       The results of significant diagnostics from this hospitalization (including imaging, microbiology, ancillary and laboratory) are listed below for reference.    Significant Diagnostic Studies: Dg Chest 2 View (if Patient Has Fever And/or Copd)  10/10/2013   CLINICAL DATA:  Shortness of breath, cough. History of COPD and right thigh sarcoma.  EXAM: CHEST  2 VIEW  COMPARISON:  NM PET IMAGE INITIAL (PI) SKULL BASE TO THIGH dated 09/29/2011; DG CHEST 2 VIEW dated 08/19/2011  FINDINGS: Cardiomediastinal silhouette is unremarkable. Markedly increased lung volumes  with flattening of the hemidiaphragms, and mild chronic interstitial changes. The lungs are otherwise clear without pleural effusions or focal consolidations. Trachea projects midline and there is no pneumothorax. Soft tissue planes and included osseous structures are non-suspicious. Multiple EKG lines overlie the patient and may obscure subtle underlying pathology.  IMPRESSION: Severe COPD without superimposed acute cardiopulmonary process.   Electronically Signed   By: Elon Alas   On: 10/10/2013 01:17    Microbiology: Recent Results (from the past 240 hour(s))  MRSA PCR SCREENING     Status: None   Collection Time    10/10/13  3:28 AM      Result Value Ref Range Status   MRSA by PCR NEGATIVE  NEGATIVE Final   Comment:            The GeneXpert MRSA Assay (FDA     approved for NASAL specimens     only), is one component of  a     comprehensive MRSA colonization     surveillance program. It is not     intended to diagnose MRSA     infection nor to guide or     monitor treatment for     MRSA infections.     Labs: Basic Metabolic Panel:  Recent Labs Lab 10/10/13 0050 10/10/13 0520 10/11/13 0845 10/12/13 0210  NA 133* 129* 133* 135*  K 2.7* 3.2* 3.9 3.7  CL 88* 87* 90* 91*  CO2 32 29 31 32  GLUCOSE 118* 162* 130* 121*  BUN 9 9 12 16   CREATININE 0.83 0.63 0.58 0.54  CALCIUM 9.1 8.6 9.2 9.0  MG  --   --   --  2.0  PHOS  --   --   --  2.4   Liver Function Tests:  Recent Labs Lab 10/10/13 0520 10/11/13 0845 10/12/13 0210  AST 18 20 23   ALT 16 16 20   ALKPHOS 63 55 57  BILITOT 0.4 0.5 0.2*  PROT 6.4 6.5 5.8*  ALBUMIN 3.7 3.5 3.4*   No results found for this basename: LIPASE, AMYLASE,  in the last 168 hours No results found for this basename: AMMONIA,  in the last 168 hours CBC:  Recent Labs Lab 10/10/13 0050 10/10/13 0520 10/11/13 0845  WBC 14.3* 12.9* 15.0*  NEUTROABS  --  12.5*  --   HGB 14.7 13.4 13.6  HCT 40.6 37.5* 38.2*  MCV 92.3 92.1 92.7  PLT  221 212 196   Cardiac Enzymes:  Recent Labs Lab 10/10/13 0520  TROPONINI <0.30   BNP: BNP (last 3 results) No results found for this basename: PROBNP,  in the last 8760 hours CBG: No results found for this basename: GLUCAP,  in the last 168 hours     Signed:  Nita Sells  [facility] 10/13/2013, 10:04 AM

## 2013-10-13 NOTE — Progress Notes (Signed)
SATURATION QUALIFICATIONS: (This note is used to comply with regulatory documentation for home oxygen)  Patient Saturations on Room Air at Rest = 92%  Patient Saturations on Room Air while Ambulating = 91%  Please briefly explain why patient needs home oxygen: NA  Jillyn Ledger, MBA, BS, RN

## 2013-10-13 NOTE — Progress Notes (Signed)
Patient discharge teaching given, including activity, diet, follow-up appoints, and medications. Patient verbalized understanding of all discharge instructions. IV access was d/c'd. Vitals are stable. Skin is intact except as charted in most recent assessments. Pt to be escorted out by NT, to be driven home by family.  Syrenity Klepacki, MBA, BS, RN 

## 2013-10-15 ENCOUNTER — Telehealth: Payer: Self-pay | Admitting: Pulmonary Disease

## 2013-10-15 NOTE — Telephone Encounter (Signed)
Will not.  I did not order home health.

## 2013-10-15 NOTE — Telephone Encounter (Signed)
Called and made charlene aware.

## 2013-10-15 NOTE — Telephone Encounter (Signed)
Spoke with charlene. She reports pt was d/c w/ home health orders for pt COPD. They will educate and instruct pt about COPD and how to use his equipment. She wants to make sure [facility] will sign the plan of care on pt. Please advise Thanks

## 2013-10-16 ENCOUNTER — Other Ambulatory Visit: Payer: Self-pay | Admitting: Pulmonary Disease

## 2013-10-17 ENCOUNTER — Ambulatory Visit (INDEPENDENT_AMBULATORY_CARE_PROVIDER_SITE_OTHER): Payer: Medicare Other | Admitting: Pulmonary Disease

## 2013-10-17 ENCOUNTER — Telehealth: Payer: Self-pay | Admitting: Pulmonary Disease

## 2013-10-17 ENCOUNTER — Encounter: Payer: Self-pay | Admitting: Pulmonary Disease

## 2013-10-17 VITALS — BP 88/56 | HR 81 | Temp 97.6°F | Ht 69.0 in | Wt 106.8 lb

## 2013-10-17 DIAGNOSIS — J438 Other emphysema: Secondary | ICD-10-CM

## 2013-10-17 DIAGNOSIS — J439 Emphysema, unspecified: Secondary | ICD-10-CM

## 2013-10-17 NOTE — Telephone Encounter (Signed)
Spoke with Izora Gala with [facility] and this is an FYI for Dr clance that they have put in a referral to social services to see what kind of services pt will be eligible for.

## 2013-10-17 NOTE — Progress Notes (Signed)
   Subjective:    Patient ID: Hector House, male    DOB: [birth year], 68 y.o.   MRN: [id number]  HPI The patient comes in today for followup of his known severe COPD. He was recently in the hospital with a COPD exacerbation, complicated by acute respiratory failure. He was treated with antibiotics, steroids, and aggressive bronchodilators. He was discharged on 10/13/2013, and feels that he has done well. He has not smoked since being out of the hospital, and feels that his breathing and cough with mucus is much improved. He was discharged on high-dose prednisone which will have to be weaned.   Review of Systems  Constitutional: Negative for fever and unexpected weight change.  HENT: Negative for congestion, dental problem, ear pain, nosebleeds, postnasal drip, rhinorrhea, sinus pressure, sneezing, sore throat and trouble swallowing.   Eyes: Negative for redness and itching.  Respiratory: Positive for shortness of breath. Negative for cough, chest tightness and wheezing.   Cardiovascular: Negative for palpitations and leg swelling.  Gastrointestinal: Negative for nausea and vomiting.  Genitourinary: Negative for dysuria.  Musculoskeletal: Negative for joint swelling.  Skin: Negative for rash.  Neurological: Negative for headaches.  Hematological: Does not bruise/bleed easily.  Psychiatric/Behavioral: Negative for dysphoric mood. The patient is not nervous/anxious.        Objective:   Physical Exam Thin male in no acute distress Nose without purulence or discharge noted Neck without lymphadenopathy or thyromegaly Chest with very diminished breath sounds bilaterally, no active wheezing Heart sounds distant but regular Lower extremities without edema, no cyanosis Alert and oriented, moves all 4 extremities.       Assessment & Plan:

## 2013-10-17 NOTE — Assessment & Plan Note (Signed)
The patient has had a recent hospitalization for an acute exacerbation, and he feels that he is much improved since discharge. He is no longer smoking, and feels this has made a big difference to his cough, congestion, and shortness of breath. He is on high-dose prednisone, and we need to taper him off of this. He is on an adequate maintenance bronchodilator regimen, and I've asked him to continue. I will see him back fairly send, and is doing better, I have encouraged him to consider pulmonary rehabilitation. He did have a lowish blood pressure today, and I've asked him to discontinue his lisinopril until he can see his primary care physician.

## 2013-10-17 NOTE — Patient Instructions (Signed)
Stop your blood pressure medicine (lisinopril) until you can see your primary md to re-evaluate Decrease prednisone to 20mg  a day ( one tab a day) for 4 days, then 1/2 tab a day for 4 days, then 1/4 tab a day for 4 days, then stop Stay on your current breathing medications. Stay off cigarettes.  You are doing great. followup with me again in 6weeks.

## 2013-10-24 ENCOUNTER — Ambulatory Visit: Payer: Medicare Other | Admitting: Internal Medicine

## 2013-10-30 ENCOUNTER — Other Ambulatory Visit: Payer: Self-pay | Admitting: Pulmonary Disease

## 2013-11-01 ENCOUNTER — Emergency Department (HOSPITAL_COMMUNITY): Payer: Medicare Other

## 2013-11-01 ENCOUNTER — Inpatient Hospital Stay (HOSPITAL_COMMUNITY)
Admission: EM | Admit: 2013-11-01 | Discharge: 2013-11-04 | DRG: 190 | Disposition: A | Discharge status: Home / Self Care | Payer: Medicare Other | Attending: Internal Medicine | Admitting: Internal Medicine

## 2013-11-01 ENCOUNTER — Encounter (HOSPITAL_COMMUNITY): Payer: Self-pay | Admitting: General Practice

## 2013-11-01 DIAGNOSIS — F064 Anxiety disorder due to known physiological condition: Secondary | ICD-10-CM | POA: Diagnosis present

## 2013-11-01 DIAGNOSIS — R778 Other specified abnormalities of plasma proteins: Secondary | ICD-10-CM

## 2013-11-01 DIAGNOSIS — K219 Gastro-esophageal reflux disease without esophagitis: Secondary | ICD-10-CM | POA: Diagnosis present

## 2013-11-01 DIAGNOSIS — M545 Low back pain, unspecified: Secondary | ICD-10-CM | POA: Diagnosis present

## 2013-11-01 DIAGNOSIS — I2510 Atherosclerotic heart disease of native coronary artery without angina pectoris: Secondary | ICD-10-CM | POA: Diagnosis present

## 2013-11-01 DIAGNOSIS — F17200 Nicotine dependence, unspecified, uncomplicated: Secondary | ICD-10-CM

## 2013-11-01 DIAGNOSIS — J441 Chronic obstructive pulmonary disease with (acute) exacerbation: Secondary | ICD-10-CM | POA: Diagnosis present

## 2013-11-01 DIAGNOSIS — F411 Generalized anxiety disorder: Secondary | ICD-10-CM

## 2013-11-01 DIAGNOSIS — I10 Essential (primary) hypertension: Secondary | ICD-10-CM | POA: Diagnosis present

## 2013-11-01 DIAGNOSIS — R7989 Other specified abnormal findings of blood chemistry: Secondary | ICD-10-CM

## 2013-11-01 DIAGNOSIS — Z923 Personal history of irradiation: Secondary | ICD-10-CM

## 2013-11-01 DIAGNOSIS — R0902 Hypoxemia: Secondary | ICD-10-CM

## 2013-11-01 DIAGNOSIS — R748 Abnormal levels of other serum enzymes: Secondary | ICD-10-CM | POA: Diagnosis present

## 2013-11-01 DIAGNOSIS — Z8589 Personal history of malignant neoplasm of other organs and systems: Secondary | ICD-10-CM

## 2013-11-01 DIAGNOSIS — R079 Chest pain, unspecified: Secondary | ICD-10-CM | POA: Diagnosis present

## 2013-11-01 DIAGNOSIS — Z87891 Personal history of nicotine dependence: Secondary | ICD-10-CM

## 2013-11-01 DIAGNOSIS — J439 Emphysema, unspecified: Secondary | ICD-10-CM

## 2013-11-01 DIAGNOSIS — M129 Arthropathy, unspecified: Secondary | ICD-10-CM | POA: Diagnosis present

## 2013-11-01 DIAGNOSIS — J9601 Acute respiratory failure with hypoxia: Secondary | ICD-10-CM | POA: Diagnosis present

## 2013-11-01 DIAGNOSIS — J9600 Acute respiratory failure, unspecified whether with hypoxia or hypercapnia: Secondary | ICD-10-CM | POA: Diagnosis present

## 2013-11-01 DIAGNOSIS — Z7982 Long term (current) use of aspirin: Secondary | ICD-10-CM

## 2013-11-01 DIAGNOSIS — E43 Unspecified severe protein-calorie malnutrition: Secondary | ICD-10-CM | POA: Insufficient documentation

## 2013-11-01 DIAGNOSIS — F1010 Alcohol abuse, uncomplicated: Secondary | ICD-10-CM

## 2013-11-01 DIAGNOSIS — I219 Acute myocardial infarction, unspecified: Secondary | ICD-10-CM

## 2013-11-01 DIAGNOSIS — F419 Anxiety disorder, unspecified: Secondary | ICD-10-CM

## 2013-11-01 LAB — CBC WITH DIFFERENTIAL/PLATELET
Basophils Absolute: 0 10*3/uL (ref 0.0–0.1)
Basophils Relative: 0 % (ref 0–1)
EOS PCT: 2 % (ref 0–5)
Eosinophils Absolute: 0.1 10*3/uL (ref 0.0–0.7)
HCT: 37.7 % — ABNORMAL LOW (ref 39.0–52.0)
HEMOGLOBIN: 13.1 g/dL (ref 13.0–17.0)
LYMPHS ABS: 1.5 10*3/uL (ref 0.7–4.0)
Lymphocytes Relative: 19 % (ref 12–46)
MCH: 32.8 pg (ref 26.0–34.0)
MCHC: 34.7 g/dL (ref 30.0–36.0)
MCV: 94.3 fL (ref 78.0–100.0)
MONOS PCT: 8 % (ref 3–12)
Monocytes Absolute: 0.6 10*3/uL (ref 0.1–1.0)
NEUTROS PCT: 72 % (ref 43–77)
Neutro Abs: 5.7 10*3/uL (ref 1.7–7.7)
Platelets: 183 10*3/uL (ref 150–400)
RBC: 4 MIL/uL — AB (ref 4.22–5.81)
RDW: 13.4 % (ref 11.5–15.5)
WBC: 7.9 10*3/uL (ref 4.0–10.5)

## 2013-11-01 LAB — URINALYSIS, ROUTINE W REFLEX MICROSCOPIC
Bilirubin Urine: NEGATIVE
GLUCOSE, UA: NEGATIVE mg/dL
Hgb urine dipstick: NEGATIVE
Ketones, ur: NEGATIVE mg/dL
LEUKOCYTES UA: NEGATIVE
Nitrite: NEGATIVE
PH: 5 (ref 5.0–8.0)
Protein, ur: NEGATIVE mg/dL
Specific Gravity, Urine: 1.026 (ref 1.005–1.030)
Urobilinogen, UA: 0.2 mg/dL (ref 0.0–1.0)

## 2013-11-01 LAB — CBC
HCT: 38.4 % — ABNORMAL LOW (ref 39.0–52.0)
Hemoglobin: 13.2 g/dL (ref 13.0–17.0)
MCH: 32.7 pg (ref 26.0–34.0)
MCHC: 34.4 g/dL (ref 30.0–36.0)
MCV: 95 fL (ref 78.0–100.0)
PLATELETS: 188 10*3/uL (ref 150–400)
RBC: 4.04 MIL/uL — ABNORMAL LOW (ref 4.22–5.81)
RDW: 13.5 % (ref 11.5–15.5)
WBC: 9.2 10*3/uL (ref 4.0–10.5)

## 2013-11-01 LAB — BASIC METABOLIC PANEL
BUN: 17 mg/dL (ref 6–23)
CO2: 24 mEq/L (ref 19–32)
Calcium: 8.4 mg/dL (ref 8.4–10.5)
Chloride: 102 mEq/L (ref 96–112)
Creatinine, Ser: 0.72 mg/dL (ref 0.50–1.35)
GFR calc Af Amer: >90 mL/min (ref >90)
GFR calc non Af Amer: >90 mL/min (ref >90)
GLUCOSE: 121 mg/dL — AB (ref 70–99)
POTASSIUM: 3.5 meq/L — AB (ref 3.7–5.3)
Sodium: 140 mEq/L (ref 137–147)

## 2013-11-01 LAB — CREATININE, SERUM
Creatinine, Ser: 0.56 mg/dL (ref 0.50–1.35)
GFR calc Af Amer: >90 mL/min (ref >90)
GFR calc non Af Amer: >90 mL/min (ref >90)

## 2013-11-01 LAB — TROPONIN I
Troponin I: <0.3 ng/mL (ref <0.30)
Troponin I: 0.44 ng/mL (ref <0.30)
Troponin I: 0.39 ng/mL (ref <0.30)

## 2013-11-01 LAB — PRO B NATRIURETIC PEPTIDE: PRO B NATRI PEPTIDE: 506.2 pg/mL — AB (ref 0–125)

## 2013-11-01 MED ORDER — ALBUTEROL SULFATE (2.5 MG/3ML) 0.083% IN NEBU: 2.5000 mg | INHALATION_SOLUTION | RESPIRATORY_TRACT | Status: DC | PRN

## 2013-11-01 MED ORDER — METHYLPREDNISOLONE SODIUM SUCC 125 MG IJ SOLR
125.0000 mg | Freq: Once | INTRAMUSCULAR | Status: AC
  Administered 2013-11-01: 125 mg via INTRAVENOUS
  Filled 2013-11-01: qty 2

## 2013-11-01 MED ORDER — GUAIFENESIN-DM 100-10 MG/5ML PO SYRP
5.0000 mL | ORAL_SOLUTION | ORAL | Status: DC | PRN
  Filled 2013-11-01: qty 5

## 2013-11-01 MED ORDER — SODIUM CHLORIDE 0.9 % IJ SOLN
3.0000 mL | Freq: Two times a day (BID) | INTRAMUSCULAR | Status: DC
  Administered 2013-11-01 – 2013-11-04 (×5): 3 mL via INTRAVENOUS

## 2013-11-01 MED ORDER — FUROSEMIDE 10 MG/ML IJ SOLN
20.0000 mg | Freq: Once | INTRAMUSCULAR | Status: AC
  Administered 2013-11-01: 20 mg via INTRAVENOUS
  Filled 2013-11-01: qty 2

## 2013-11-01 MED ORDER — OXYCODONE HCL 5 MG PO TABS
5.0000 mg | ORAL_TABLET | ORAL | Status: DC | PRN
  Filled 2013-11-01: qty 1

## 2013-11-01 MED ORDER — ALUM & MAG HYDROXIDE-SIMETH 200-200-20 MG/5ML PO SUSP: 30.0000 mL | Freq: Four times a day (QID) | ORAL | Status: DC | PRN

## 2013-11-01 MED ORDER — ATORVASTATIN CALCIUM 40 MG PO TABS
40.0000 mg | ORAL_TABLET | Freq: Every day | ORAL | Status: DC
  Administered 2013-11-01 – 2013-11-03 (×3): 40 mg via ORAL
  Filled 2013-11-01 (×4): qty 1

## 2013-11-01 MED ORDER — ACETAMINOPHEN 325 MG PO TABS: 650.0000 mg | ORAL_TABLET | Freq: Four times a day (QID) | ORAL | Status: DC | PRN

## 2013-11-01 MED ORDER — SODIUM CHLORIDE 0.9 % IJ SOLN
3.0000 mL | INTRAMUSCULAR | Status: DC | PRN
Start: 2013-11-01 — End: 2013-11-04

## 2013-11-01 MED ORDER — BUSPIRONE HCL 5 MG PO TABS
5.0000 mg | ORAL_TABLET | Freq: Three times a day (TID) | ORAL | Status: DC
  Administered 2013-11-01 – 2013-11-04 (×10): 5 mg via ORAL
  Filled 2013-11-01 (×13): qty 1

## 2013-11-01 MED ORDER — ALBUTEROL SULFATE (2.5 MG/3ML) 0.083% IN NEBU
2.5000 mg | INHALATION_SOLUTION | RESPIRATORY_TRACT | Status: AC | PRN
  Administered 2013-11-01: 2.5 mg via RESPIRATORY_TRACT
  Filled 2013-11-01: qty 3

## 2013-11-01 MED ORDER — ONDANSETRON HCL 4 MG PO TABS: 4.0000 mg | ORAL_TABLET | Freq: Four times a day (QID) | ORAL | Status: DC | PRN

## 2013-11-01 MED ORDER — LEVOFLOXACIN IN D5W 750 MG/150ML IV SOLN
750.0000 mg | INTRAVENOUS | Status: DC
  Administered 2013-11-01 – 2013-11-02 (×2): 750 mg via INTRAVENOUS
  Filled 2013-11-01 (×4): qty 150

## 2013-11-01 MED ORDER — ADULT MULTIVITAMIN W/MINERALS CH
1.0000 | ORAL_TABLET | Freq: Every day | ORAL | Status: DC
  Administered 2013-11-01 – 2013-11-04 (×4): 1 via ORAL
  Filled 2013-11-01 (×4): qty 1

## 2013-11-01 MED ORDER — ACETAMINOPHEN 650 MG RE SUPP: 650.0000 mg | Freq: Four times a day (QID) | RECTAL | Status: DC | PRN

## 2013-11-01 MED ORDER — IPRATROPIUM BROMIDE 0.02 % IN SOLN
0.5000 mg | Freq: Once | RESPIRATORY_TRACT | Status: AC
  Administered 2013-11-01: 0.5 mg via RESPIRATORY_TRACT
  Filled 2013-11-01: qty 2.5

## 2013-11-01 MED ORDER — GUAIFENESIN ER 600 MG PO TB12
600.0000 mg | ORAL_TABLET | Freq: Two times a day (BID) | ORAL | Status: DC
  Administered 2013-11-01 – 2013-11-04 (×7): 600 mg via ORAL
  Filled 2013-11-01 (×9): qty 1

## 2013-11-01 MED ORDER — PANTOPRAZOLE SODIUM 40 MG PO TBEC
40.0000 mg | DELAYED_RELEASE_TABLET | Freq: Every day | ORAL | Status: DC
  Administered 2013-11-01 – 2013-11-04 (×4): 40 mg via ORAL
  Filled 2013-11-01 (×2): qty 1

## 2013-11-01 MED ORDER — SODIUM CHLORIDE 0.9 % IV SOLN: 250.0000 mL | INTRAVENOUS | Status: DC | PRN

## 2013-11-01 MED ORDER — ASPIRIN EC 81 MG PO TBEC
81.0000 mg | DELAYED_RELEASE_TABLET | Freq: Every day | ORAL | Status: DC
  Administered 2013-11-01 – 2013-11-04 (×4): 81 mg via ORAL
  Filled 2013-11-01 (×4): qty 1

## 2013-11-01 MED ORDER — BUDESONIDE 0.5 MG/2ML IN SUSP
0.5000 mg | Freq: Two times a day (BID) | RESPIRATORY_TRACT | Status: DC
  Administered 2013-11-01 – 2013-11-03 (×4): 0.5 mg via RESPIRATORY_TRACT
  Filled 2013-11-01 (×8): qty 2

## 2013-11-01 MED ORDER — MORPHINE SULFATE 2 MG/ML IJ SOLN: 1.0000 mg | INTRAMUSCULAR | Status: DC | PRN

## 2013-11-01 MED ORDER — ALBUTEROL SULFATE (2.5 MG/3ML) 0.083% IN NEBU
5.0000 mg | INHALATION_SOLUTION | Freq: Once | RESPIRATORY_TRACT | Status: AC
  Administered 2013-11-01: 5 mg via RESPIRATORY_TRACT
  Filled 2013-11-01: qty 6

## 2013-11-01 MED ORDER — ENOXAPARIN SODIUM 40 MG/0.4ML ~~LOC~~ SOLN
40.0000 mg | SUBCUTANEOUS | Status: DC
  Administered 2013-11-01 – 2013-11-03 (×3): 40 mg via SUBCUTANEOUS
  Filled 2013-11-01 (×4): qty 0.4

## 2013-11-01 MED ORDER — ATORVASTATIN CALCIUM 40 MG PO TABS
40.0000 mg | ORAL_TABLET | Freq: Every day | ORAL | Status: DC
  Filled 2013-11-01: qty 1

## 2013-11-01 MED ORDER — ONDANSETRON HCL 4 MG/2ML IJ SOLN: 4.0000 mg | Freq: Four times a day (QID) | INTRAMUSCULAR | Status: DC | PRN

## 2013-11-01 MED ORDER — IPRATROPIUM-ALBUTEROL 0.5-2.5 (3) MG/3ML IN SOLN
3.0000 mL | Freq: Four times a day (QID) | RESPIRATORY_TRACT | Status: DC
  Administered 2013-11-01 – 2013-11-02 (×6): 3 mL via RESPIRATORY_TRACT
  Filled 2013-11-01 (×9): qty 3

## 2013-11-01 MED ORDER — SODIUM CHLORIDE 0.9 % IJ SOLN
3.0000 mL | Freq: Two times a day (BID) | INTRAMUSCULAR | Status: DC
  Administered 2013-11-01 – 2013-11-04 (×5): 3 mL via INTRAVENOUS

## 2013-11-01 MED ORDER — SERTRALINE HCL 25 MG PO TABS
25.0000 mg | ORAL_TABLET | Freq: Every day | ORAL | Status: DC
  Administered 2013-11-01 – 2013-11-04 (×4): 25 mg via ORAL
  Filled 2013-11-01 (×4): qty 1

## 2013-11-01 MED ORDER — LORAZEPAM 2 MG/ML IJ SOLN
INTRAMUSCULAR | Status: AC
  Filled 2013-11-01: qty 1

## 2013-11-01 MED ORDER — LORAZEPAM 2 MG/ML IJ SOLN
1.0000 mg | Freq: Once | INTRAMUSCULAR | Status: AC
  Administered 2013-11-01: 1 mg via INTRAVENOUS

## 2013-11-01 NOTE — Progress Notes (Signed)
CRITICAL VALUE ALERT  Critical value received:  Troponin 0.44  Date of notification:  11/01/13  Time of notification: 12:25  Critical value read back: yes  Nurse who received alert:  Evangeline Dakin  MD notified (1st page): [place]  Time of first page:  12:29  MD notified (2nd page):  Time of second page:  Responding MD:  Sloan Leiter  Time MD responded:  12:30

## 2013-11-01 NOTE — Consult Note (Signed)
CARDIOLOGY CONSULT NOTE   Patient ID: Hector House MRN: [id number] DOB/AGE: 09/05/1945 68 y.o.  Admit date: 11/01/2013  Primary Physician   Pcp Not In System Primary Cardiologist   New Reason for Consultation   Chest pain  HPI: Hector House is a 68 y.o. male with a history of severe COPD, 100 pack-yr hx smoking, HTN, right leg leiomyosarcoma s/p excision/radiation and no past cardiac history who presented to the [facility] ED this morning in acute respiratory failure and mentions an episode of chest pain last night. Patient states that since yesterday morning he has been short of breath with productive cough. Throughout the evening and overnight his shortness of breath worsened and EMS was called. Upon EMS arrival, patient was noted to be diophoretic, in respiratory distress, unable to speak and using accessory muscles. His O2 sats were in the 70s, and he was started on BiPAP. He was given steroids nebulizer treatments in the ED, and was able to be weaned off the BiPAP. He was recently discharged from this facility on 10/13/13 for acute respritory failure. He was doing well since that time, and claims to have quit tobacco and alcohol since his discharge. The patient also mentions chest pain that occurred last night. He has had chest pain before but it is usually associated with coughing. Last night he had chest pain while sitting on his bed and not related to cough. It was a left sided, stabbing pain that radiated to his left arm. It was associated with SOB, diaphoresis and lightheadedness. No n/v, abdominal pain, syncope. He was on Lisinopril for HTN which was discontinued by his pulmonologist on 10/17/13 secondary to low BPS.    Past Medical History  Diagnosis Date  . Hypertension   . Cancer     skin  . Arthritis   . Low back pain syndrome     Joint Pain  . Emphysema   . S/P radiation therapy 07/23/10 - 09/14/10    Right Thigh for Leiomyosarcoma / 61.2 Gy / 34 Fractions  . Reflux   .  Leiomyosarcoma of thigh 2011  . Shortness of breath   . Respiratory failure with hypoxia 10/31/2013     Past Surgical History  Procedure Laterality Date  . Laminectomy  2008    Three back surgeries per patient medical history form dated 05/06/10.  Marland Kitchen Elbow surgery  1967    L  . Leg skin lesion  biopsy / excision  04/20/10 / re-excision 06/18/10     Right thigh then re-excision of Lymph node right \\grion  06/18/10    No Known Allergies  I have reviewed the patient's current medications . aspirin EC  81 mg Oral Daily  . budesonide  0.5 mg Nebulization BID  . busPIRone  5 mg Oral TID  . enoxaparin (LOVENOX) injection  40 mg Subcutaneous Q24H  . furosemide  20 mg Intravenous Once  . guaiFENesin  600 mg Oral BID  . ipratropium-albuterol  3 mL Nebulization Q6H  . levofloxacin (LEVAQUIN) IV  750 mg Intravenous Q24H  . multivitamin with minerals  1 tablet Oral Daily  . pantoprazole  40 mg Oral Q1200  . sertraline  25 mg Oral Daily  . sodium chloride  3 mL Intravenous Q12H  . sodium chloride  3 mL Intravenous Q12H     sodium chloride, acetaminophen, acetaminophen, albuterol, alum & mag hydroxide-simeth, guaiFENesin-dextromethorphan, morphine injection, ondansetron (ZOFRAN) IV, ondansetron, oxyCODONE, sodium chloride  Prior to Admission medications   Medication  Sig Start Date End Date Taking? Authorizing Provider  albuterol (PROVENTIL HFA;VENTOLIN HFA) 108 (90 BASE) MCG/ACT inhaler Inhale 2 puffs into the lungs every 4 (four) hours as needed for shortness of breath. INHALE 2 PUFFS BY MOUTH EVERY 4 TO 6 HOURS AS NEEDED 03/27/13  Yes Kathee Delton, MD  aspirin 81 MG tablet Take 81 mg by mouth daily. 01/29/13  Yes Jayce G Cook, DO  budesonide (PULMICORT) 0.5 MG/2ML nebulizer solution Take 0.5 mg by nebulization 2 (two) times daily. 04/02/13  Yes Kathee Delton, MD  busPIRone (BUSPAR) 5 MG tablet Take 1 tablet (5 mg total) by mouth 3 (three) times daily. 10/13/13  Yes Nita Sells, MD    ipratropium-albuterol (DUONEB) 0.5-2.5 (3) MG/3ML SOLN Take 3 mLs by nebulization 4 (four) times daily. Can use an additional 2 treatments if needed. 04/02/13  Yes Kathee Delton, MD  Multiple Vitamins-Minerals (CENTRUM SILVER PO) Take 1 tablet by mouth daily.    Yes Historical Provider, MD  sertraline (ZOLOFT) 25 MG tablet Take 1 tablet (25 mg total) by mouth daily. 10/13/13  Yes Nita Sells, MD     History   Social History  . Marital Status: Divorced    Spouse Name: N/A    Number of Children: N/A  . Years of Education: N/A   Occupational History  . retired. 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 Smoking status: Former Smoker -- 0.15 packs/day for 43 years    Types: Cigarettes    Quit date: 11/29/2012  . Smokeless tobacco: Never Used     Comment: Pt states QUIT 11/29/12-2 ppd.   . Alcohol Use: 10.8 oz/week    18 Cans of beer per week     Comment: Consumption per week.  . Drug Use: No  . Sexual Activity: Not on file   Other Topics Concern  . Not on file   Social History Narrative   Pt is divorced and now single. Lives alone.   Drinks a 12 pack of beer a week.    Family Status  Relation Status Death Age  . Mother Deceased   . Father Deceased   . Sister Alive   . Brother Alive    Family History  Problem Relation Age of Onset  . Heart disease Mother     heart attack     ROS:  Full 14 point review of systems complete and found to be negative unless listed above.  Physical Exam: Blood pressure 113/78, pulse 83, temperature 97.5 F (36.4 C), temperature source Oral, resp. rate 23, SpO2 94.00%.  General: Sickly appearing malnourshined, male in no acute distress Head: Eyes PERRLA, No xanthomas.   Normocephalic and atraumatic, oropharynx without edema or exudate. Dentition:  Lungs: Rhodochrous expiratory  breath sounds,  Heart: HRRR S1 S2, no rub/gallop, Heart irregular rate and rhythm with S1, S2  murmur.  pulses are 2+ extrem.   Neck: No carotid bruits. No lymphadenopathy.  JVD. Abdomen: Bowel sounds present, abdomen soft and non-tender without masses or hernias noted. Msk:  No spine or cva tenderness. No weakness, no joint deformities or effusions. Extremities: No clubbing or cyanosis.  edema.  Neuro: Alert and oriented X 3. No focal deficits noted. Psych:  Good affect, responds appropriately Skin: No rashes or lesions noted.  Labs:   Lab Results  Component Value Date   WBC 9.2 11/01/2013   HGB 13.2 11/01/2013   HCT 38.4* 11/01/2013  MCV 95.0 11/01/2013   PLT 188 11/01/2013     Recent Labs Lab 11/01/13 0440 11/01/13 1035  NA 140  --   K 3.5*  --   CL 102  --   CO2 24  --   BUN 17  --   CREATININE 0.72 0.56  CALCIUM 8.4  --   GLUCOSE 121*  --    Magnesium  Date Value Ref Range Status  10/12/2013 2.0  1.5 - 2.5 mg/dL Final    Recent Labs  11/01/13 0440 11/01/13 1035  TROPONINI <0.30 0.44*   No results found for this basename: TROPIPOC,  in the last 72 hours Pro B Natriuretic peptide (BNP)  Date/Time Value Ref Range Status  11/01/2013  4:40 AM 506.2* 0 - 125 pg/mL Final    Echo: Pending  ECG:  Sinus rhythm Left axis deviation Anteroseptal infarct, age indeterminate  Radiology:  Dg Chest Portable 1 View  11/01/2013   CLINICAL DATA:  Shortness of breath.  EXAM: PORTABLE CHEST - 1 VIEW  COMPARISON:  Chest x-ray 10/10/2013.  FINDINGS: Lungs are markedly hyperexpanded with flattening of the diaphragms and pruning of the pulmonary vasculature in the periphery, compatible with underlying emphysema. No acute consolidative airspace disease. No pleural effusions. No suspicious appearing pulmonary nodules or masses. No pneumothorax. No evidence of pulmonary edema. Heart size is normal. Mediastinal contours are unremarkable. Atherosclerosis in the thoracic aorta.  IMPRESSION: 1. Advanced emphysematous changes redemonstrated, without radiographic evidence of acute cardiopulmonary  disease. 2. Atherosclerosis.   Electronically Signed   By: Vinnie Langton M.D.   On: 11/01/2013 04:26    ASSESSMENT AND PLAN:    Principal Problem:   Acute respiratory failure with hypoxia Active Problems:   TOBACCO USER   COPD exacerbation   Anxiety   Chest pain  Chest pain- in the setting of mildly elevated 2nd troponin (0.44). He has risk factors including family history of CAD in his mother, age, hx of heavy tobacco abuse, HTN. -- ECG with no acute ST or TW changes. -- Continue to cycle CEs  -- Continue aspirin, will add statin with the high likelihood of CAD (liver enzymes normal). Will order a fasting lipid panel in the AM -- ECHO pending for assess LV function and possible WMAs  -- No chest pain currently. Consider cardiac cath at some point when felt stable   HTN- He was on Lisinopril which was discontinued by his pulmonologist on 10/17/13 secondary to low BPS. BP stable currently.  Acute respiratory failure with hypoxia  -- Secondary to COPD exacerbation. Required BiPAP initially, now has been weaned off BiPAP and on nasal cannula. BNP also mildly elevated and he was treated with one dose of Lasix.  --Treat COPD with steroids, nebulized bronchodilators and empiric antibiotics.  -- Managed per IM   Hx of tobacco abuse -- Reports that he has not smoked for the past 2 weeks.   Anxiety- he gets panic attacks, which worsens his breathing, consider maximizing his anxiolytics. Per internal medicine    Signed: Perry Mount, PA-C 11/01/2013 12:56 PM  Pager [phone]  Co-Sign MD

## 2013-11-01 NOTE — H&P (Signed)
PATIENT DETAILS Name: Hector House Age: 68 y.o. Sex: male Date of Birth: 05-Feb-1946 Admit Date: 11/01/2013 PCP:Pcp Not In System   CHIEF COMPLAINT:  Shortness of breath since yesterday evening  HPI: Hector House is a 68 y.o. male with a Past Medical History of COPD, recently quit tobacco, was as discharged from this facility on 10/13/13 use who presents today with the above noted complaint. The patient, he was doing well since discharge, he claims he has not used tobacco since his discharge from this facility, started having some shortness of breath yesterday evening. Throughout the evening and overnight his shortness of breath worsened. As a result EMS was called, patient was very hypoxic initially, was placed on BiPAP and brought to the emergency room. He was given steroids nebulizer treatments, and was able to be weaned off the BiPAP. I was subsequently called to admit this patient for further evaluation and treatment. During my evaluation, patient also stated that he had left this sided chest pains that were going to his left shoulder area last night. He currently denied having any ongoing chest pains. He denies any nausea, vomiting, diarrhea. He denies any fever headaches as well.   ALLERGIES:  No Known Allergies  PAST MEDICAL HISTORY: Past Medical History  Diagnosis Date  . Hypertension   . Cancer     skin  . Arthritis   . Low back pain syndrome     Joint Pain  . Emphysema   . S/P radiation therapy 07/23/10 - 09/14/10    Right Thigh for Leiomyosarcoma / 61.2 Gy / 34 Fractions  . Reflux   . Leiomyosarcoma of thigh 2011  . Shortness of breath   . Respiratory failure with hypoxia 10/31/2013    PAST SURGICAL HISTORY: Past Surgical History  Procedure Laterality Date  . Laminectomy  2008    Three back surgeries per patient medical history form dated 05/06/10.  Marland Kitchen Elbow surgery  1967    L  . Leg skin lesion  biopsy / excision  04/20/10 / re-excision 06/18/10     Right  thigh then re-excision of Lymph node right \\grion  06/18/10    MEDICATIONS AT HOME: Prior to Admission medications   Medication Sig Start Date End Date Taking? Authorizing Provider  albuterol (PROVENTIL HFA;VENTOLIN HFA) 108 (90 BASE) MCG/ACT inhaler Inhale 2 puffs into the lungs every 4 (four) hours as needed for shortness of breath. INHALE 2 PUFFS BY MOUTH EVERY 4 TO 6 HOURS AS NEEDED 03/27/13  Yes Kathee Delton, MD  aspirin 81 MG tablet Take 81 mg by mouth daily. 01/29/13  Yes Jayce G Cook, DO  budesonide (PULMICORT) 0.5 MG/2ML nebulizer solution Take 0.5 mg by nebulization 2 (two) times daily. 04/02/13  Yes Kathee Delton, MD  busPIRone (BUSPAR) 5 MG tablet Take 1 tablet (5 mg total) by mouth 3 (three) times daily. 10/13/13  Yes Nita Sells, MD  ipratropium-albuterol (DUONEB) 0.5-2.5 (3) MG/3ML SOLN Take 3 mLs by nebulization 4 (four) times daily. Can use an additional 2 treatments if needed. 04/02/13  Yes Kathee Delton, MD  Multiple Vitamins-Minerals (CENTRUM SILVER PO) Take 1 tablet by mouth daily.    Yes Historical Provider, MD  sertraline (ZOLOFT) 25 MG tablet Take 1 tablet (25 mg total) by mouth daily. 10/13/13  Yes Nita Sells, MD    FAMILY HISTORY: Family History  Problem Relation Age of Onset  . Heart disease Mother     heart attack  SOCIAL HISTORY:  reports that he quit smoking about 11 months ago. His smoking use included Cigarettes. He has a 6.45 pack-year smoking history. He has never used smokeless tobacco. He reports that he drinks about 10.8 ounces of alcohol per week. He reports that he does not use illicit drugs.  REVIEW OF SYSTEMS:  Constitutional:   No  weight loss, night sweats,  Fevers, chills, fatigue.  HEENT:    No headaches, Difficulty swallowing,Tooth/dental problems,Sore throat,  No sneezing, itching, ear ache, nasal congestion, post nasal drip,   Cardio-vascular: No chest pain,  Orthopnea, PND, swelling in lower extremities, anasarca,  dizziness, palpitations  GI:  No heartburn, indigestion, abdominal pain, nausea, vomiting, diarrhea, change in       bowel habits, loss of appetite  Resp:   No coughing up of blood.No change in color of mucus.No chest wall deformity  Skin:  no rash or lesions.  GU:  no dysuria, change in color of urine, no urgency or frequency.  No flank pain.  Musculoskeletal: No joint pain or swelling.  No decreased range of motion.  No back pain.  Psych: No change in mood or affect. No depression or anxiety.  No memory loss.   PHYSICAL EXAM: Blood pressure 113/78, pulse 83, temperature 97.5 F (36.4 C), temperature source Oral, resp. rate 23, SpO2 94.00%.  General appearance :Awake, alert, not in any distress. Speech Clear. Not toxic Looking HEENT: Atraumatic and Normocephalic, pupils equally reactive to light and accomodation Neck: supple, no JVD. No cervical lymphadenopathy.  Chest:decreased air entry bilaterally at bases,rhonchi CVS: S1 S2 regular, no murmurs.  Abdomen: Bowel sounds present, Non tender and not distended with no gaurding, rigidity or rebound. Extremities: B/L Lower Ext shows no edema, both legs are warm to touch Neurology: Awake alert, and oriented X 3, CN II-XII intact, Non focal Skin:No Rash Wounds:N/A  LABS ON ADMISSION:   Recent Labs  11/01/13 0440 11/01/13 1035  NA 140  --   K 3.5*  --   CL 102  --   CO2 24  --   GLUCOSE 121*  --   BUN 17  --   CREATININE 0.72 0.56  CALCIUM 8.4  --    No results found for this basename: AST, ALT, ALKPHOS, BILITOT, PROT, ALBUMIN,  in the last 72 hours No results found for this basename: LIPASE, AMYLASE,  in the last 72 hours  Recent Labs  11/01/13 0440 11/01/13 1035  WBC 7.9 9.2  NEUTROABS 5.7  --   HGB 13.1 13.2  HCT 37.7* 38.4*  MCV 94.3 95.0  PLT 183 188    Recent Labs  11/01/13 0440 11/01/13 1035  TROPONINI <0.30 0.44*   No results found for this basename: DDIMER,  in the last 72 hours No  components found with this basename: POCBNP,    RADIOLOGIC STUDIES ON ADMISSION: Dg Chest Portable 1 View  11/01/2013   CLINICAL DATA:  Shortness of breath.  EXAM: PORTABLE CHEST - 1 VIEW  COMPARISON:  Chest x-ray 10/10/2013.  FINDINGS: Lungs are markedly hyperexpanded with flattening of the diaphragms and pruning of the pulmonary vasculature in the periphery, compatible with underlying emphysema. No acute consolidative airspace disease. No pleural effusions. No suspicious appearing pulmonary nodules or masses. No pneumothorax. No evidence of pulmonary edema. Heart size is normal. Mediastinal contours are unremarkable. Atherosclerosis in the thoracic aorta.  IMPRESSION: 1. Advanced emphysematous changes redemonstrated, without radiographic evidence of acute cardiopulmonary disease. 2. Atherosclerosis.   Electronically Signed   By: Quillian Quince  Entrikin M.D.   On: 11/01/2013 04:26    EKG: Independently reviewed. Sinus rhythm  ASSESSMENT AND PLAN: Present on Admission:  . Acute respiratory failure with hypoxia - Secondary to COPD exacerbation. As noted above, required BiPAP initially, now has been weaned off BiPAP.Continue with oxygen. BNP also mildly elevated, will treat with one dose of Lasix. Treat COPD with steroids, nebulized bronchodilators and empiric antibiotics. - Patient will need a oxygen desaturation screen prior to discharge to see if these would require oxygen at home.  Marland Kitchen COPD exacerbation - Apparently has severe-Gold stage IV COPD. Claims to have not smoke since his recent discharge.  - Somewhat improved and on initial presentation to the emergency room, respiratory status much better. Now on nasal cannula. Not using any accessory muscles, easily speaking in full sentences.  - Continue with steroids, empiric Levaquin and nebulized bronchodilators he   . TOBACCO USER - Claims that he has not smoked for the past 2 weeks.  - Counseled today as well   . Anxiety - Continue with Zoloft  and BuSpar   . Chest pain -? Secondary to bronchospasm, however pain on the left chest going to the shoulder. - Will admit to telemetry and cycle cardiac enzymes. Continue aspirin. - Will check an echocardiogram to see any wall motion abnormality.  Further plan will depend as patient's clinical course evolves and further radiologic and laboratory data become available. Patient will be monitored closely.  Above noted plan was discussed with patient/family , they were in agreement.   DVT Prophylaxis: Prophylactic Lovenox   Code Status: Full Code  Total time spent for admission equals 45 minutes.  [facility] Pager [phone]  If 7PM-7AM, please contact night-coverage [email or web address] Password [email or web address] 11/01/2013, 12:29 PM

## 2013-11-01 NOTE — Progress Notes (Signed)
Utilization Review Completed.Hector House T3/26/2015  

## 2013-11-01 NOTE — Consult Note (Signed)
Personally seen and examined. Agree with above. Discussed care plan with patient and family. Given his high likelihood of CAD, +trop .4, I would recommend cardiac cath in approximately two days or when he has improved from lung/ severe COPD perspective. Just in case he is up for cath tomorrow, I will make NPO past midnight. May have 3v disease. CABG would be high risk given COPD. Would advocate nonetheless for angiogram to define anatomy.

## 2013-11-01 NOTE — ED Notes (Addendum)
Pt states that he doesn't want to call family at 4 in the morning. Pt states that should he be unable to make informed deciswions call his sister [name] [phone] first or his son [name] second. Pts neighbor is a POC with phone numbers for various family and looks in on Pt =  Dianna Coble [phone]

## 2013-11-01 NOTE — ED Notes (Signed)
Pt was very weak walking with assistance. After about a minute, O2 dropped to 90% with a 105 HR, without oxygen.

## 2013-11-01 NOTE — ED Notes (Addendum)
Pt began having SOB approx 1800 25MAR2015 despite home neb tx pt progressivly became worse. EMS called and found Pt having increase WOB and intercostal use. EMS unable to maintain sats on [place] or NRB and started CPAP on transport. EMS vitals 138/76 HR 78 SPO2 100 on 100%o2 on CPAP.  Pt reports productive cough x2-3 days prior to becoming SOB  Pt admitted 1 month ago for COPD exacerbation and quit smoking at that time as well.

## 2013-11-01 NOTE — ED Provider Notes (Signed)
CSN: [id number]     Arrival date & time 11/01/13  0413 History   First MD Initiated Contact with Patient 11/01/13 [phone]     Chief Complaint  Patient presents with  . Shortness of Breath     (Consider location/radiation/quality/duration/timing/severity/associated sxs/prior Treatment) HPI Comments: Pt comes in with cc of shortness of breath. Pt recently diagnosed with emphysema, severe. He has hx of HTN as well. Reports that last night, he acutely became short of breath, and had to call EMS. Upon EMS arrival, patient was noted to be diophoretic, in respiratory distress, unable to speak and using accessory muscles. His O2 sats were in the 70s, and he was started on bipap. Pt arrives to the ER on bipap. WE attempted to take the bipap off, just to see how he was doing, and he immediately decompensated, and so we had to continue with bipap. Denies chest pain. No new cough, no smoking, or fevers.  Patient is a 68 y.o. male presenting with shortness of breath. The history is provided by the patient.  Shortness of Breath Associated symptoms: no abdominal pain, no chest pain, no cough, no fever and no wheezing     Past Medical History  Diagnosis Date  . Hypertension   . Cancer     skin  . Arthritis   . Low back pain syndrome     Joint Pain  . Emphysema   . S/P radiation therapy 07/23/10 - 09/14/10    Right Thigh for Leiomyosarcoma / 61.2 Gy / 34 Fractions  . Reflux   . Leiomyosarcoma of thigh 2011   Past Surgical History  Procedure Laterality Date  . Laminectomy  2008    Three back surgeries per patient medical history form dated 05/06/10.  Marland Kitchen Elbow surgery  1967    L  . Leg skin lesion  biopsy / excision  04/20/10 / re-excision 06/18/10     Right thigh then re-excision of Lymph node right \\grion  06/18/10   Family History  Problem Relation Age of Onset  . Heart disease Mother     heart attack   History  Substance Use Topics  . Smoking status: Former Smoker -- 0.15 packs/day for 43  years    Types: Cigarettes    Quit date: 11/29/2012  . Smokeless tobacco: Never Used     Comment: Pt states QUIT 11/29/12-2 ppd.   . Alcohol Use: 10.8 oz/week    18 Cans of beer per week     Comment: Consumption per week.    Review of Systems  Constitutional: Negative for fever, activity change and appetite change.  Respiratory: Positive for shortness of breath. Negative for cough and wheezing.   Cardiovascular: Negative for chest pain.  Gastrointestinal: Negative for abdominal pain.  Genitourinary: Negative for dysuria.  All other systems reviewed and are negative.      Allergies  Review of patient's allergies indicates no known allergies.  Home Medications   Current Outpatient Rx  Name  Route  Sig  Dispense  Refill  . albuterol (PROVENTIL HFA;VENTOLIN HFA) 108 (90 BASE) MCG/ACT inhaler   Inhalation   Inhale 2 puffs into the lungs every 4 (four) hours as needed for shortness of breath. INHALE 2 PUFFS BY MOUTH EVERY 4 TO 6 HOURS AS NEEDED         . aspirin 81 MG tablet   Oral   Take 81 mg by mouth daily.         . budesonide (PULMICORT) 0.5 MG/2ML nebulizer solution  Nebulization   Take 0.5 mg by nebulization 2 (two) times daily.         . busPIRone (BUSPAR) 5 MG tablet   Oral   Take 1 tablet (5 mg total) by mouth 3 (three) times daily.   90 tablet   0   . ipratropium-albuterol (DUONEB) 0.5-2.5 (3) MG/3ML SOLN   Nebulization   Take 3 mLs by nebulization 4 (four) times daily. Can use an additional 2 treatments if needed.         . Multiple Vitamins-Minerals (CENTRUM SILVER PO)   Oral   Take 1 tablet by mouth daily.          . sertraline (ZOLOFT) 25 MG tablet   Oral   Take 1 tablet (25 mg total) by mouth daily.   30 tablet   0    BP 121/78  Pulse 89  Temp(Src) 97.5 F (36.4 C) (Oral)  Resp 20  SpO2 100% Physical Exam  Nursing note and vitals reviewed. Constitutional: He is oriented to person, place, and time. He appears well-developed.   HENT:  Head: Normocephalic and atraumatic.  Eyes: Conjunctivae and EOM are normal. Pupils are equal, round, and reactive to light.  Neck: Normal range of motion. Neck supple. No JVD present.  Cardiovascular: Normal rate and regular rhythm.   Pulmonary/Chest: Breath sounds normal. He is in respiratory distress. He has no wheezes. He exhibits no tenderness.  Poor air entry diffusely  Abdominal: Soft. Bowel sounds are normal. He exhibits no distension. There is no tenderness. There is no rebound and no guarding.  Musculoskeletal: He exhibits no edema.  Neurological: He is alert and oriented to person, place, and time.  Skin: Skin is warm.    ED Course  Procedures (including critical care time) Labs Review Labs Reviewed  CBC WITH DIFFERENTIAL - Abnormal; Notable for the following:    RBC 4.00 (*)    HCT 37.7 (*)    All other components within normal limits  BASIC METABOLIC PANEL - Abnormal; Notable for the following:    Potassium 3.5 (*)    Glucose, Bld 121 (*)    All other components within normal limits  PRO B NATRIURETIC PEPTIDE - Abnormal; Notable for the following:    Pro B Natriuretic peptide (BNP) 506.2 (*)    All other components within normal limits  TROPONIN I  URINALYSIS, ROUTINE W REFLEX MICROSCOPIC   Imaging Review Dg Chest Portable 1 View  11/01/2013   CLINICAL DATA:  Shortness of breath.  EXAM: PORTABLE CHEST - 1 VIEW  COMPARISON:  Chest x-ray 10/10/2013.  FINDINGS: Lungs are markedly hyperexpanded with flattening of the diaphragms and pruning of the pulmonary vasculature in the periphery, compatible with underlying emphysema. No acute consolidative airspace disease. No pleural effusions. No suspicious appearing pulmonary nodules or masses. No pneumothorax. No evidence of pulmonary edema. Heart size is normal. Mediastinal contours are unremarkable. Atherosclerosis in the thoracic aorta.  IMPRESSION: 1. Advanced emphysematous changes redemonstrated, without radiographic  evidence of acute cardiopulmonary disease. 2. Atherosclerosis.   Electronically Signed   By: Vinnie Langton M.D.   On: 11/01/2013 04:26     EKG Interpretation   Date/Time:  Thursday November 01 2013 04:17:42 EDT Ventricular Rate:  90 PR Interval:  154 QRS Duration: 92 QT Interval:  356 QTC Calculation: 436 R Axis:   -69 Text Interpretation:  Sinus rhythm Left axis deviation Anteroseptal  infarct, age indeterminate Confirmed by Kathrynn Humble, MD, Herny Scurlock [phone]) on  11/01/2013 7:39:59 AM  MDM   Final diagnoses:  Hypoxia  COPD exacerbation    Pt comes in with cc of DIB. Pt has advanced lung disease. Noted to be in hypoxic respiratory failure upon arrival, and we continued him on bipap. After an hour, he felt better, and we gave weaned him off of bipap, on to nasal canula. However, pt is still having dib, and is very nervous about going home, as he felt that he nearly died. Pulmonary noted reviewed. Not sure if he needs home O2.  Will admit to Hospitalist.  CRITICAL CARE Performed by: Varney Biles   Total critical care time: 45 minutes - hypoxic respiratory failure-  Required bipap at arrival.  Critical care time was exclusive of separately billable procedures and treating other patients.  Critical care was necessary to treat or prevent imminent or life-threatening deterioration.  Critical care was time spent personally by me on the following activities: development of treatment plan with patient and/or surrogate as well as nursing, discussions with consultants, evaluation of patient's response to treatment, examination of patient, obtaining history from patient or surrogate, ordering and performing treatments and interventions, ordering and review of laboratory studies, ordering and review of radiographic studies, pulse oximetry and re-evaluation of patient's condition.     Varney Biles, MD 11/01/13 [phone]

## 2013-11-02 ENCOUNTER — Encounter (HOSPITAL_COMMUNITY)
Admission: EM | Disposition: A | Discharge status: Home / Self Care | Payer: Self-pay | Source: Home / Self Care | Attending: Internal Medicine

## 2013-11-02 DIAGNOSIS — R7989 Other specified abnormal findings of blood chemistry: Secondary | ICD-10-CM

## 2013-11-02 DIAGNOSIS — E43 Unspecified severe protein-calorie malnutrition: Secondary | ICD-10-CM | POA: Insufficient documentation

## 2013-11-02 DIAGNOSIS — R778 Other specified abnormalities of plasma proteins: Secondary | ICD-10-CM

## 2013-11-02 DIAGNOSIS — R799 Abnormal finding of blood chemistry, unspecified: Secondary | ICD-10-CM

## 2013-11-02 DIAGNOSIS — R0602 Shortness of breath: Secondary | ICD-10-CM

## 2013-11-02 HISTORY — PX: LEFT HEART CATHETERIZATION WITH CORONARY ANGIOGRAM: SHX5451

## 2013-11-02 LAB — CBC
HCT: 34 % — ABNORMAL LOW (ref 39.0–52.0)
HEMOGLOBIN: 12 g/dL — AB (ref 13.0–17.0)
MCH: 32.7 pg (ref 26.0–34.0)
MCHC: 35.3 g/dL (ref 30.0–36.0)
MCV: 92.6 fL (ref 78.0–100.0)
Platelets: 164 10*3/uL (ref 150–400)
RBC: 3.67 MIL/uL — AB (ref 4.22–5.81)
RDW: 13.4 % (ref 11.5–15.5)
WBC: 6.6 10*3/uL (ref 4.0–10.5)

## 2013-11-02 LAB — PROTIME-INR
INR: 1.07 (ref 0.00–1.49)
Prothrombin Time: 13.7 seconds (ref 11.6–15.2)

## 2013-11-02 LAB — LIPID PANEL
CHOL/HDL RATIO: 2 ratio
Cholesterol: 172 mg/dL (ref 0–200)
HDL: 85 mg/dL (ref >39)
LDL Cholesterol: 79 mg/dL (ref 0–99)
Triglycerides: 38 mg/dL (ref <150)
VLDL: 8 mg/dL (ref 0–40)

## 2013-11-02 LAB — BASIC METABOLIC PANEL
BUN: 9 mg/dL (ref 6–23)
CO2: 27 mEq/L (ref 19–32)
Calcium: 8.5 mg/dL (ref 8.4–10.5)
Chloride: 100 mEq/L (ref 96–112)
Creatinine, Ser: 0.62 mg/dL (ref 0.50–1.35)
GFR calc Af Amer: >90 mL/min (ref >90)
GFR calc non Af Amer: >90 mL/min (ref >90)
GLUCOSE: 110 mg/dL — AB (ref 70–99)
POTASSIUM: 3.5 meq/L — AB (ref 3.7–5.3)
SODIUM: 141 meq/L (ref 137–147)

## 2013-11-02 LAB — TROPONIN I: Troponin I: <0.3 ng/mL (ref <0.30)

## 2013-11-02 SURGERY — LEFT HEART CATHETERIZATION WITH CORONARY ANGIOGRAM
Anesthesia: LOCAL

## 2013-11-02 MED ORDER — IPRATROPIUM-ALBUTEROL 0.5-2.5 (3) MG/3ML IN SOLN
3.0000 mL | Freq: Three times a day (TID) | RESPIRATORY_TRACT | Status: DC
  Administered 2013-11-03 – 2013-11-04 (×4): 3 mL via RESPIRATORY_TRACT
  Filled 2013-11-02 (×4): qty 3

## 2013-11-02 MED ORDER — SODIUM CHLORIDE 0.9 % IV SOLN: INTRAVENOUS | Status: DC

## 2013-11-02 MED ORDER — VERAPAMIL HCL 2.5 MG/ML IV SOLN
INTRAVENOUS | Status: AC
  Filled 2013-11-02: qty 2

## 2013-11-02 MED ORDER — PREDNISONE 20 MG PO TABS
40.0000 mg | ORAL_TABLET | Freq: Every day | ORAL | Status: DC
  Administered 2013-11-02 – 2013-11-04 (×3): 40 mg via ORAL
  Filled 2013-11-02 (×6): qty 2

## 2013-11-02 MED ORDER — MIDAZOLAM HCL 2 MG/2ML IJ SOLN
INTRAMUSCULAR | Status: AC
  Filled 2013-11-02: qty 2

## 2013-11-02 MED ORDER — SODIUM CHLORIDE 0.9 % IV SOLN: 250.0000 mL | INTRAVENOUS | Status: DC | PRN

## 2013-11-02 MED ORDER — NITROGLYCERIN 0.2 MG/ML ON CALL CATH LAB
INTRAVENOUS | Status: AC
  Filled 2013-11-02: qty 1

## 2013-11-02 MED ORDER — ENSURE COMPLETE PO LIQD
237.0000 mL | Freq: Two times a day (BID) | ORAL | Status: DC
  Administered 2013-11-03 – 2013-11-04 (×2): 237 mL via ORAL
  Filled 2013-11-02 (×6): qty 237

## 2013-11-02 MED ORDER — DIAZEPAM 5 MG PO TABS
5.0000 mg | ORAL_TABLET | ORAL | Status: AC
  Administered 2013-11-02: 5 mg via ORAL
  Filled 2013-11-02: qty 1

## 2013-11-02 MED ORDER — SODIUM CHLORIDE 0.9 % IJ SOLN: 3.0000 mL | Freq: Two times a day (BID) | INTRAMUSCULAR | Status: DC

## 2013-11-02 MED ORDER — ALBUTEROL SULFATE (2.5 MG/3ML) 0.083% IN NEBU
2.5000 mg | INHALATION_SOLUTION | RESPIRATORY_TRACT | Status: DC | PRN
  Administered 2013-11-03: 2.5 mg via RESPIRATORY_TRACT
  Filled 2013-11-02: qty 3

## 2013-11-02 MED ORDER — SODIUM CHLORIDE 0.9 % IV SOLN: 1.0000 mL/kg/h | INTRAVENOUS | Status: AC

## 2013-11-02 MED ORDER — SODIUM CHLORIDE 0.9 % IJ SOLN: 3.0000 mL | INTRAMUSCULAR | Status: DC | PRN

## 2013-11-02 MED ORDER — LIDOCAINE HCL (PF) 1 % IJ SOLN
INTRAMUSCULAR | Status: AC
  Filled 2013-11-02: qty 30

## 2013-11-02 MED ORDER — FENTANYL CITRATE 0.05 MG/ML IJ SOLN
INTRAMUSCULAR | Status: AC
  Filled 2013-11-02: qty 2

## 2013-11-02 MED ORDER — HEPARIN SODIUM (PORCINE) 1000 UNIT/ML IJ SOLN
INTRAMUSCULAR | Status: AC
  Filled 2013-11-02: qty 1

## 2013-11-02 MED ORDER — HEPARIN (PORCINE) IN NACL 2-0.9 UNIT/ML-% IJ SOLN
INTRAMUSCULAR | Status: AC
  Filled 2013-11-02: qty 1000

## 2013-11-02 NOTE — Progress Notes (Signed)
TR BAND REMOVAL  LOCATION:    right radial  DEFLATED PER PROTOCOL:    yes  TIME BAND OFF / DRESSING APPLIED:    2045   SITE UPON ARRIVAL:    Level 0  SITE AFTER BAND REMOVAL:    Level 0  REVERSE ALLEN'S TEST:     positive  CIRCULATION SENSATION AND MOVEMENT:    Within Normal Limits   yes  COMMENTS:   Gauze dressing applied at 2045. Site rechecked at 2115 without change in assessment, dressing dry and intact, level 0, CSMs wnls and radial and ulnar pulses +2.

## 2013-11-02 NOTE — H&P (View-Only) (Signed)
    Subjective:  No chest pain overnight. Breathing improved. Still dyspneic with low-level activity.  Objective:  Vital Signs in the last 24 hours: Temp:  [97.8 F (36.6 C)-98 F (36.7 C)] 98 F (36.7 C) (03/27 3500) Pulse Rate:  [73-84] 75 (03/27 0613) Resp:  [16-18] 16 (03/27 0613) BP: (97-122)/(53-65) 113/62 mmHg (03/27 0613) SpO2:  [94 %-99 %] 98 % (03/27 0820) Weight:  [49 kg (108 lb 0.4 oz)] 49 kg (108 lb 0.4 oz) (03/26 1250)  Intake/Output from previous day: 03/26 0701 - 03/27 0700 In: 243 [P.O.:240; I.V.:3] Out: -   Physical Exam: Pt is alert and oriented, chronically ill-appearing thin male in NAD HEENT: normal Neck: JVP - normal Lungs: CTA bilaterally with distant breath sounds CV: RRR without murmur or gallop, distant heart sounds. Abd: soft, NT, Positive BS, no hepatomegaly Ext: no C/C/E, distal pulses intact and equal Skin: warm/dry no rash   Lab Results:  Recent Labs  11/01/13 1035 11/02/13 0315  WBC 9.2 6.6  HGB 13.2 12.0*  PLT 188 164    Recent Labs  11/01/13 0440 11/01/13 1035 11/02/13 0315  NA 140  --  141  K 3.5*  --  3.5*  CL 102  --  100  CO2 24  --  27  GLUCOSE 121*  --  110*  BUN 17  --  9  CREATININE 0.72 0.56 0.62    Recent Labs  11/01/13 1528 11/02/13 0002  TROPONINI 0.39* <0.30    Cardiac Studies: 2D Echo: Study Conclusions  Left ventricle: The cavity size was normal. Wall thickness was normal. Systolic function was normal. The estimated ejection fraction was in the range of 50% to 55%. Wall motion was normal; there were no regional wall motion abnormalities. Left ventricular diastolic function parameters were normal.    Tele: Sinus rhythm, personally reviewed  Assessment/Plan:  Troponin elevation in pt with severe COPD and acute on chronic respiratory failure. Severe underlying COPD noted. He has had some chest pain and certainly has a high-risk of obstructive CAD. Plan cardiac cath and possible PCI today.  I've reviewed the risks, indications, and alternatives to cath and PCI with the patient who understands and agrees to proceed. Continue ASA and atorvastatin. No beta blocker secondary to acute COPD exacerbation.  Sherren Mocha, M.D. 11/02/2013, 12:10 PM

## 2013-11-02 NOTE — Progress Notes (Signed)
PROGRESS NOTE   Hector House KGU:[id number] DOB: 1945/09/28 DOA: 11/01/2013 PCP: Pcp Not In System   Assessment/Plan: AECOPD - continue steroids, antibiotics, and breathing treatments. He is now wheezing this morning. We'll have to assess oxygen requirements prior to discharge. Acute respiratory failure with hypoxia - due to #1 Chest pain / elevated troponin- patient with positive troponins. High risk for coronary artery disease. Cardiology was consulted yesterday, and plan is for left heart catheterization today. Tobacco abuse, in remission - quit during his last hospitalization a month ago. Anxiety - continue home medications.  Diet: N.p.o. Fluids: None DVT Prophylaxis: Lovenox  Code Status: Full code Family Communication: Discussed with patient  Disposition Plan: Cardiac catheterization today  Consultants:  Cardiology  Procedures:  None   Antibiotics Levofloxacin 3/26>>  HPI/Subjective: Feeling better this morning, breathing is improved. Denies any chest pain, nausea vomiting or diarrhea.  Objective: Filed Vitals:   11/02/13 0207 11/02/13 6283 11/02/13 0820 11/02/13 1224  BP:  113/62    Pulse: 82 75    Temp:  98 F (36.7 C)    TempSrc:  Oral    Resp: 16 16    Height:      Weight:    46.448 kg (102 lb 6.4 oz)  SpO2: 97% 94% 98%     Intake/Output Summary (Last 24 hours) at 11/02/13 1308 Last data filed at 11/02/13 1222  Gross per 24 hour  Intake    249 ml  Output   1500 ml  Net  -1251 ml   Filed Weights   11/01/13 1250 11/02/13 1224  Weight: 49 kg (108 lb 0.4 oz) 46.448 kg (102 lb 6.4 oz)    Exam:  General:  NAD  Cardiovascular: regular rate and rhythm, without MRG  Respiratory: Decreased breath sounds bilaterally, no wheezing  Abdomen: soft, not tender to palpation, positive bowel sounds  MSK: no peripheral edema  Neuro: CN 2-12 grossly intact, MS 5/5 in all 4  Data Reviewed: Basic Metabolic Panel:  Recent Labs Lab 11/01/13 0440  11/01/13 1035 11/02/13 0315  NA 140  --  141  K 3.5*  --  3.5*  CL 102  --  100  CO2 24  --  27  GLUCOSE 121*  --  110*  BUN 17  --  9  CREATININE 0.72 0.56 0.62  CALCIUM 8.4  --  8.5   CBC:  Recent Labs Lab 11/01/13 0440 11/01/13 1035 11/02/13 0315  WBC 7.9 9.2 6.6  NEUTROABS 5.7  --   --   HGB 13.1 13.2 12.0*  HCT 37.7* 38.4* 34.0*  MCV 94.3 95.0 92.6  PLT 183 188 164   Cardiac Enzymes:  Recent Labs Lab 11/01/13 0440 11/01/13 1035 11/01/13 1528 11/02/13 0002  TROPONINI <0.30 0.44* 0.39* <0.30   BNP (last 3 results)  Recent Labs  11/01/13 0440  PROBNP 506.2*   Studies: Dg Chest Portable 1 View  11/01/2013   CLINICAL DATA:  Shortness of breath.  EXAM: PORTABLE CHEST - 1 VIEW  COMPARISON:  Chest x-ray 10/10/2013.  FINDINGS: Lungs are markedly hyperexpanded with flattening of the diaphragms and pruning of the pulmonary vasculature in the periphery, compatible with underlying emphysema. No acute consolidative airspace disease. No pleural effusions. No suspicious appearing pulmonary nodules or masses. No pneumothorax. No evidence of pulmonary edema. Heart size is normal. Mediastinal contours are unremarkable. Atherosclerosis in the thoracic aorta.  IMPRESSION: 1. Advanced emphysematous changes redemonstrated, without radiographic evidence of acute cardiopulmonary disease. 2. Atherosclerosis.  Electronically Signed   By: Vinnie Langton M.D.   On: 11/01/2013 04:26    Scheduled Meds: . aspirin EC  81 mg Oral Daily  . atorvastatin  40 mg Oral q1800  . budesonide  0.5 mg Nebulization BID  . busPIRone  5 mg Oral TID  . diazepam  5 mg Oral On Call  . enoxaparin (LOVENOX) injection  40 mg Subcutaneous Q24H  . guaiFENesin  600 mg Oral BID  . ipratropium-albuterol  3 mL Nebulization Q6H  . levofloxacin (LEVAQUIN) IV  750 mg Intravenous Q24H  . multivitamin with minerals  1 tablet Oral Daily  . pantoprazole  40 mg Oral Q1200  . predniSONE  40 mg Oral Q breakfast  .  sertraline  25 mg Oral Daily  . sodium chloride  3 mL Intravenous Q12H  . sodium chloride  3 mL Intravenous Q12H  . sodium chloride  3 mL Intravenous Q12H   Continuous Infusions: . [START ON 11/03/2013] sodium chloride      Principal Problem:   Acute respiratory failure with hypoxia Active Problems:   TOBACCO USER   COPD exacerbation   Anxiety   Chest pain   Elevated troponin  Time spent: 35  This note has been created with Surveyor, quantity. Any transcriptional errors are unintentional.   Marzetta Board, MD [facility] Pager [phone]. If 7 PM - 7 AM, please contact night-coverage at [email or web address], password [facility] 11/02/2013, 1:08 PM  LOS: 1 day

## 2013-11-02 NOTE — Interval H&P Note (Signed)
History and Physical Interval Note:  11/02/2013 6:39 PM  Hector House  has presented today for surgery, with the diagnosis of CP  The various methods of treatment have been discussed with the patient and family. After consideration of risks, benefits and other options for treatment, the patient has consented to  Procedure(s): LEFT HEART CATHETERIZATION WITH CORONARY ANGIOGRAM (N/A) as a surgical intervention .  The patient's history has been reviewed, patient examined, no change in status, stable for surgery.  I have reviewed the patient's chart and labs.  Questions were answered to the patient's satisfaction.     [name] 11/02/2013 6:39 PM Cath Lab Visit (complete for each Cath Lab visit)  Clinical Evaluation Leading to the Procedure:   ACS: yes  Non-ACS:    Anginal Classification: CCS III  Anti-ischemic medical therapy: No Therapy  Non-Invasive Test Results: No non-invasive testing performed  Prior CABG: No previous CABG

## 2013-11-02 NOTE — Progress Notes (Signed)
    Subjective:  No chest pain overnight. Breathing improved. Still dyspneic with low-level activity.  Objective:  Vital Signs in the last 24 hours: Temp:  [97.8 F (36.6 C)-98 F (36.7 C)] 98 F (36.7 C) (03/27 0613) Pulse Rate:  [73-84] 75 (03/27 0613) Resp:  [16-18] 16 (03/27 0613) BP: (97-122)/(53-65) 113/62 mmHg (03/27 0613) SpO2:  [94 %-99 %] 98 % (03/27 0820) Weight:  [49 kg (108 lb 0.4 oz)] 49 kg (108 lb 0.4 oz) (03/26 1250)  Intake/Output from previous day: 03/26 0701 - 03/27 0700 In: 243 [P.O.:240; I.V.:3] Out: -   Physical Exam: Pt is alert and oriented, chronically ill-appearing thin male in NAD HEENT: normal Neck: JVP - normal Lungs: CTA bilaterally with distant breath sounds CV: RRR without murmur or gallop, distant heart sounds. Abd: soft, NT, Positive BS, no hepatomegaly Ext: no C/C/E, distal pulses intact and equal Skin: warm/dry no rash   Lab Results:  Recent Labs  11/01/13 1035 11/02/13 0315  WBC 9.2 6.6  HGB 13.2 12.0*  PLT 188 164    Recent Labs  11/01/13 0440 11/01/13 1035 11/02/13 0315  NA 140  --  141  K 3.5*  --  3.5*  CL 102  --  100  CO2 24  --  27  GLUCOSE 121*  --  110*  BUN 17  --  9  CREATININE 0.72 0.56 0.62    Recent Labs  11/01/13 1528 11/02/13 0002  TROPONINI 0.39* <0.30    Cardiac Studies: 2D Echo: Study Conclusions  Left ventricle: The cavity size was normal. Wall thickness was normal. Systolic function was normal. The estimated ejection fraction was in the range of 50% to 55%. Wall motion was normal; there were no regional wall motion abnormalities. Left ventricular diastolic function parameters were normal.    Tele: Sinus rhythm, personally reviewed  Assessment/Plan:  Troponin elevation in pt with severe COPD and acute on chronic respiratory failure. Severe underlying COPD noted. He has had some chest pain and certainly has a high-risk of obstructive CAD. Plan cardiac cath and possible PCI today.  I've reviewed the risks, indications, and alternatives to cath and PCI with the patient who understands and agrees to proceed. Continue ASA and atorvastatin. No beta blocker secondary to acute COPD exacerbation.  Magaline Steinberg, M.D. 11/02/2013, 12:10 PM     

## 2013-11-02 NOTE — CV Procedure (Signed)
    Cardiac Catheterization Procedure Note  Name: Hector House MRN: [id number] DOB: 04/22/1946  Procedure: Left Heart Cath, Selective Coronary Angiography, LV angiography  Indication: 68 yo WM admitted with COPD exacerbation associated with elevated troponins.   Procedural Details: The right wrist was prepped, draped, and anesthetized with 1% lidocaine. Using the modified Seldinger technique, a 5 French sheath was introduced into the right radial artery. 3 mg of verapamil was administered through the sheath, weight-based unfractionated heparin was administered intravenously. Standard Judkins catheters were used for selective coronary angiography and left ventriculography. Catheter exchanges were performed over an exchange length guidewire. There were no immediate procedural complications. A TR band was used for radial hemostasis at the completion of the procedure.  The patient was transferred to the post catheterization recovery area for further monitoring.  Procedural Findings: Hemodynamics: AO 126/63 mean 90 mm Hg LV 122/8 mm Hg  Coronary angiography: Coronary dominance: right  Left mainstem: Normal  Left anterior descending (LAD): The LAD gives off 2 diagonal branches then supplies the apex. It is normal.  Left circumflex (LCx): Small, gives rise to a single OM branch. Normal.  Right coronary artery (RCA): Normal.  Left ventriculography: Left ventricular systolic function is normal, LVEF is estimated at 55-65%, there is no significant mitral regurgitation   Final Conclusions:   1. Normal coronary anatomy 2. Normal LV function.  Recommendations: Treat pulmonary disease.  Collier Salina [facility] 11/02/2013, 7:01 PM

## 2013-11-02 NOTE — Progress Notes (Addendum)
INITIAL NUTRITION ASSESSMENT  DOCUMENTATION CODES Per approved criteria  -Severe malnutrition in the context of chronic illness -Underweight   INTERVENTION: 1.  Once diet advanced recommend Ensure Complete po TID, each supplement provides 350 kcal and 13 grams of protein  2.  Discussed with pt importance of utilizing assistance programs to help with meals, etc.   NUTRITION DIAGNOSIS: Malnutrition related to chronic illness as evidenced by severe fat and muscle wasting and intake </= 75% of estimated needs.   Goal: Pt to meet >/= 90% of their estimated nutrition needs   Monitor:  Diet advancement, PO intake, weight trend, labs  Reason for Assessment: Poor PO  68 y.o. male  Admitting Dx: Acute respiratory failure with hypoxia  ASSESSMENT: Pt hospitalized earlier in the month and has since quit smoking. Pt has been living at home alone with help from nearby neighbor and his sister. Pt reports that he often tires easily and is unable to prepare his food without getting SOB. Pt has hx of severe COPD.  Pt reports that breakfast is a boiled egg, usually does not eat lunch, and supper is a frozen dinner or canned stew. Per sister pt refused MOW because he has food at home but cannot prepare it, he felt that MOW is for people without food.  Pt has not drank excessively in about 2-3 years, but does continue to drink ETOH, usually 1-2 times per week he will have either 3-4 beers or 2-3 mixed drinks.  Pt feels that he has lost about 10 lb in the last several months, per chart review pt was 112 lb in 12/2012.  Pt loves ensure but cannot afford this at home. Gave sister info on getting a prescription for ensure (2 times per day for 30 days), how to sign up for coupons online, other supplement options (CIB, store brand, etc).  Potassium low, pt is on a MVI.   Nutrition Focused Physical Exam:  Subcutaneous Fat:  Orbital Region: Severe wasting Upper Arm Region: Severe wasting Thoracic and  Lumbar Region: Severe wasting   Muscle:  Temple Region: Severe wasting Clavicle Bone Region: Severe wasting Clavicle and Acromion Bone Region: Severe wasting Scapular Bone Region: Severe wasting Dorsal Hand: Severe wasting Patellar Region: Severe wasting Anterior Thigh Region: Severe wasting Posterior Calf Region: Severe wasting  Edema: not present   Height: Ht Readings from Last 1 Encounters:  11/01/13 5\' 9"  (1.753 m)    Weight: Wt Readings from Last 1 Encounters:  11/02/13 102 lb 6.4 oz (46.448 kg)    Ideal Body Weight: 72.7 kg  % Ideal Body Weight: 64%  Wt Readings from Last 10 Encounters:  11/02/13 102 lb 6.4 oz (46.448 kg)  11/02/13 102 lb 6.4 oz (46.448 kg)  10/17/13 106 lb 12.8 oz (48.444 kg)  10/12/13 104 lb 11.2 oz (47.492 kg)  06/19/13 105 lb 3.2 oz (47.718 kg)  03/19/13 106 lb 6.4 oz (48.263 kg)  12/20/12 112 lb (50.803 kg)  11/17/12 109 lb 14.4 oz (49.85 kg)  06/20/12 112 lb 6.4 oz (50.984 kg)  04/04/12 109 lb 3.2 oz (49.533 kg)    Usual Body Weight: 112 lb   % Usual Body Weight: 92%  BMI:  Body mass index is 15.11 kg/(m^2).  Estimated Nutritional Needs: Kcal: 1500-1700  Protein: 70-90g  Fluid: ~1.8 L/day  Skin: no issues noted  Diet Order: NPO  EDUCATION NEEDS: -No education needs identified at this time   Intake/Output Summary (Last 24 hours) at 11/02/13 1314 Last data filed at  11/02/13 1222  Gross per 24 hour  Intake    249 ml  Output   1500 ml  Net  -1251 ml    Last BM: 3/25   Labs:   Recent Labs Lab 11/01/13 0440 11/01/13 1035 11/02/13 0315  NA 140  --  141  K 3.5*  --  3.5*  CL 102  --  100  CO2 24  --  27  BUN 17  --  9  CREATININE 0.72 0.56 0.62  CALCIUM 8.4  --  8.5  GLUCOSE 121*  --  110*    CBG (last 3)  No results found for this basename: GLUCAP,  in the last 72 hours  Scheduled Meds: . aspirin EC  81 mg Oral Daily  . atorvastatin  40 mg Oral q1800  . budesonide  0.5 mg Nebulization BID  .  busPIRone  5 mg Oral TID  . diazepam  5 mg Oral On Call  . enoxaparin (LOVENOX) injection  40 mg Subcutaneous Q24H  . guaiFENesin  600 mg Oral BID  . ipratropium-albuterol  3 mL Nebulization Q6H  . levofloxacin (LEVAQUIN) IV  750 mg Intravenous Q24H  . multivitamin with minerals  1 tablet Oral Daily  . pantoprazole  40 mg Oral Q1200  . predniSONE  40 mg Oral Q breakfast  . sertraline  25 mg Oral Daily  . sodium chloride  3 mL Intravenous Q12H  . sodium chloride  3 mL Intravenous Q12H  . sodium chloride  3 mL Intravenous Q12H    Continuous Infusions: . [START ON 11/03/2013] sodium chloride      Past Medical History  Diagnosis Date  . Hypertension   . Cancer     skin  . Arthritis   . Low back pain syndrome     Joint Pain  . Emphysema   . S/P radiation therapy 07/23/10 - 09/14/10    Right Thigh for Leiomyosarcoma / 61.2 Gy / 34 Fractions  . Leiomyosarcoma of thigh 2011  . Shortness of breath     Past Surgical History  Procedure Laterality Date  . Laminectomy  2008    Three back surgeries per patient medical history form dated 05/06/10.  Marland Kitchen Elbow surgery  1967    L  . Leg skin lesion  biopsy / excision  04/20/10 / re-excision 06/18/10     Right thigh then re-excision of Lymph node right \\grion  06/18/10    Bayard, Beaver, CNSC [phone] Pager [phone] After Hours Pager

## 2013-11-02 NOTE — Care Management Note (Unsigned)
    Page 1 of 1   11/02/2013     5:37:33 PM   CARE MANAGEMENT NOTE 11/02/2013  Patient:  Hector House, Hector House   Account Number:  [id number]  Date Initiated:  11/02/2013  Documentation initiated by:  Dell Briner  Subjective/Objective Assessment:   PT ADM ON 3/26 WITH HYPOXIA, RESP FAILURE.  PTA, PT RESIDES AT [facility].     Action/Plan:   PT ACTIVE WITH [facility] FOR HHRN, AND CSW PRIOR TO ADMISSION.  WILL NEED RESUMPTION ORDERS FOR HH CARE PRIOR TO DC.   Anticipated DC Date:  11/05/2013   Anticipated DC Plan:  [place]  CM consult      Choice offered to / List presented to:             Status of service:  In process, will continue to follow Medicare Important Message given?   (If response is "NO", the following Medicare IM given date fields will be blank) Date Medicare IM given:   Date Additional Medicare IM given:    Discharge Disposition:    Per UR Regulation:  Reviewed for med. necessity/level of care/duration of stay  If discussed at [place] of Stay Meetings, dates discussed:    Comments:  11/02/13 Antoine Primas [phone] RECOMMEND PT/OT CONSULTS TO DETERMINE NEEDS AT DC.  THANKS.

## 2013-11-03 ENCOUNTER — Inpatient Hospital Stay (HOSPITAL_COMMUNITY): Payer: Medicare Other

## 2013-11-03 DIAGNOSIS — R0902 Hypoxemia: Secondary | ICD-10-CM

## 2013-11-03 LAB — BASIC METABOLIC PANEL
BUN: 14 mg/dL (ref 6–23)
CALCIUM: 8.4 mg/dL (ref 8.4–10.5)
CHLORIDE: 100 meq/L (ref 96–112)
CO2: 29 mEq/L (ref 19–32)
CREATININE: 0.65 mg/dL (ref 0.50–1.35)
Glucose, Bld: 87 mg/dL (ref 70–99)
Potassium: 3.3 mEq/L — ABNORMAL LOW (ref 3.7–5.3)
Sodium: 139 mEq/L (ref 137–147)

## 2013-11-03 LAB — CBC
HCT: 35.2 % — ABNORMAL LOW (ref 39.0–52.0)
Hemoglobin: 12.3 g/dL — ABNORMAL LOW (ref 13.0–17.0)
MCH: 33 pg (ref 26.0–34.0)
MCHC: 34.9 g/dL (ref 30.0–36.0)
MCV: 94.4 fL (ref 78.0–100.0)
PLATELETS: 181 10*3/uL (ref 150–400)
RBC: 3.73 MIL/uL — ABNORMAL LOW (ref 4.22–5.81)
RDW: 13.7 % (ref 11.5–15.5)
WBC: 12.2 10*3/uL — AB (ref 4.0–10.5)

## 2013-11-03 MED ORDER — IOHEXOL 350 MG/ML SOLN
80.0000 mL | Freq: Once | INTRAVENOUS | Status: AC | PRN
  Administered 2013-11-03: 10:00:00 57 mL via INTRAVENOUS

## 2013-11-03 MED ORDER — BUDESONIDE 0.5 MG/2ML IN SUSP
0.5000 mg | Freq: Two times a day (BID) | RESPIRATORY_TRACT | Status: DC
  Administered 2013-11-03 – 2013-11-04 (×2): 0.5 mg via RESPIRATORY_TRACT
  Filled 2013-11-03 (×4): qty 2

## 2013-11-03 MED ORDER — LEVOFLOXACIN 750 MG PO TABS
750.0000 mg | ORAL_TABLET | ORAL | Status: DC
  Administered 2013-11-03: 750 mg via ORAL
  Filled 2013-11-03 (×2): qty 1

## 2013-11-03 MED ORDER — POTASSIUM CHLORIDE CRYS ER 20 MEQ PO TBCR
40.0000 meq | EXTENDED_RELEASE_TABLET | Freq: Once | ORAL | Status: AC
  Administered 2013-11-03: 40 meq via ORAL
  Filled 2013-11-03: qty 2

## 2013-11-03 NOTE — Progress Notes (Signed)
SATURATION QUALIFICATIONS: (This note is used to comply with regulatory documentation for home oxygen)  Patient Saturations on Room Air at Rest 95 %  Patient Saturations on Room Air while Ambulating 94 %  Patient Saturations on 2 Liters of oxygen while Ambulating 96 %  Please briefly explain why patient needs home oxygen:

## 2013-11-03 NOTE — Progress Notes (Signed)
PROGRESS NOTE   Hector House CXK:[id number] DOB: [birth year] DOA: 11/01/2013 PCP: Pcp Not In System   Assessment/Plan: AECOPD - continue steroids, antibiotics, and breathing treatments. He is now wheezing this morning. We'll have to assess oxygen requirements prior to discharge. Acute respiratory failure with hypoxia - due to #1 Chest pain / elevated troponin- patient with positive troponins. High risk for coronary artery disease. Cardiology was consulted, cath negative. Given troponin elevation and #2, CTPA obtained, negative for PE. Tobacco abuse, in remission - quit during his last hospitalization a month ago. Anxiety - continue home medications.  Diet: N.p.o. Fluids: None DVT Prophylaxis: Lovenox  Code Status: Full code Family Communication: Discussed with patient  Disposition Plan: home 1 day  Consultants:  Cardiology  Procedures:  None   Antibiotics Levofloxacin 3/26>>  HPI/Subjective: Feeling better this morning, breathing is improved. Denies any chest pain, nausea vomiting or diarrhea.  Objective: Filed Vitals:   11/03/13 0005 11/03/13 0400 11/03/13 0835 11/03/13 1346  BP: 121/72 141/79 130/76   Pulse: 83 83 79   Temp: 97.7 F (36.5 C) 98.2 F (36.8 C) 98.3 F (36.8 C)   TempSrc: Oral Oral Oral   Resp: 17 18 18    Height:      Weight: 46.3 kg (102 lb 1.2 oz)     SpO2: 95% 94% 90% 97%    Intake/Output Summary (Last 24 hours) at 11/03/13 1413 Last data filed at 11/03/13 0900  Gross per 24 hour  Intake  763.4 ml  Output    450 ml  Net  313.4 ml   Filed Weights   11/01/13 1250 11/02/13 1224 11/03/13 0005  Weight: 49 kg (108 lb 0.4 oz) 46.448 kg (102 lb 6.4 oz) 46.3 kg (102 lb 1.2 oz)    Exam:  General:  NAD  Cardiovascular: regular rate and rhythm, without MRG  Respiratory: Decreased breath sounds bilaterally, no wheezing  Abdomen: soft, not tender to palpation, positive bowel sounds  MSK: no peripheral edema  Neuro: CN 2-12 grossly  intact, MS 5/5 in all 4  Data Reviewed: Basic Metabolic Panel:  Recent Labs Lab 11/01/13 0440 11/01/13 1035 11/02/13 0315 11/03/13 0405  NA 140  --  141 139  K 3.5*  --  3.5* 3.3*  CL 102  --  100 100  CO2 24  --  27 29  GLUCOSE 121*  --  110* 87  BUN 17  --  9 14  CREATININE 0.72 0.56 0.62 0.65  CALCIUM 8.4  --  8.5 8.4   CBC:  Recent Labs Lab 11/01/13 0440 11/01/13 1035 11/02/13 0315 11/03/13 0405  WBC 7.9 9.2 6.6 12.2*  NEUTROABS 5.7  --   --   --   HGB 13.1 13.2 12.0* 12.3*  HCT 37.7* 38.4* 34.0* 35.2*  MCV 94.3 95.0 92.6 94.4  PLT 183 188 164 181   Cardiac Enzymes:  Recent Labs Lab 11/01/13 0440 11/01/13 1035 11/01/13 1528 11/02/13 0002  TROPONINI <0.30 0.44* 0.39* <0.30   BNP (last 3 results)  Recent Labs  11/01/13 0440  PROBNP 506.2*   Studies: Ct Angio Chest Pe W/cm &/or Wo Cm  11/03/2013   CLINICAL DATA:  Shortness of breath. Chest pain. Emphysema. Cardiac catheterization yesterday. History of sarcoma of the right thigh, wide excision and postoperative radiation in 2012.  EXAM: CT ANGIOGRAPHY CHEST WITH CONTRAST  TECHNIQUE: Multidetector CT imaging of the chest was performed using the standard protocol during bolus administration of intravenous contrast. Multiplanar CT image reconstructions  and MIPs were obtained to evaluate the vascular anatomy.  CONTRAST:  32mL OMNIPAQUE IOHEXOL 350 MG/ML SOLN  COMPARISON:  DG CHEST 1V PORT dated 11/01/2013; NM PET IMAGE INITIAL (PI) SKULL BASE TO THIGH dated 09/29/2011; CT CHEST W/CM dated 07/13/2010  FINDINGS: No filling defect is identified in the pulmonary arterial tree to suggest pulmonary embolus. No aortic dissection. Atherosclerotic calcification of the aortic arch and branch vessels. No pathologic is thoracic adenopathy.  Interventricular septal contour normal. Left adrenal adenoma. Possible right adrenal adenoma.  Prominent biapical pleural parenchymal scarring, stable from 2011. Severe emphysema. Mild  bronchiectasis and dependent opacity in the posterior basal segment left lower lobe potentially from atelectasis or pneumonia. Bilateral airway thickening. Mild airway plugging in the lower lobes. Scarring or atelectasis medially in the left lower lobe.  On image 65 of series 6, a 0.7 x 0.8 x 1.2 cm cavitary lesion is present with minimal nodularity inferiorly and medially. This has a relatively thin wall.  Review of the MIP images confirms the above findings.  IMPRESSION: 1. No embolus. 2. Severe emphysema. 3. Thin-walled cavitary lesion in the right middle lobe with slight nodularity inferiorly and medially, probably an inflammatory lesion involving several small adjacent bulla, or focus of saccular bronchiectasis. Cavitary neoplastic lesion is considered very unlikely. 4. Left and possible right adrenal adenomas. 5. Airway thickening with mild bronchiectasis in the lower lobes, and airway plugging in the lower lobes.   Electronically Signed   By: Sherryl Barters M.D.   On: 11/03/2013 09:58    Scheduled Meds: . aspirin EC  81 mg Oral Daily  . atorvastatin  40 mg Oral q1800  . budesonide  0.5 mg Nebulization BID  . busPIRone  5 mg Oral TID  . enoxaparin (LOVENOX) injection  40 mg Subcutaneous Q24H  . feeding supplement (ENSURE COMPLETE)  237 mL Oral BID BM  . guaiFENesin  600 mg Oral BID  . ipratropium-albuterol  3 mL Nebulization TID  . levofloxacin  750 mg Oral Q24H  . multivitamin with minerals  1 tablet Oral Daily  . pantoprazole  40 mg Oral Q1200  . predniSONE  40 mg Oral Q breakfast  . sertraline  25 mg Oral Daily  . sodium chloride  3 mL Intravenous Q12H  . sodium chloride  3 mL Intravenous Q12H   Continuous Infusions:    Principal Problem:   Acute respiratory failure with hypoxia Active Problems:   TOBACCO USER   COPD exacerbation   Anxiety   Chest pain   Elevated troponin   Protein-calorie malnutrition, severe  Time spent: 35  This note has been created with Scientist, clinical (histocompatibility and immunogenetics). Any transcriptional errors are unintentional.   Marzetta Board, MD [facility] Pager [phone]. If 7 PM - 7 AM, please contact night-coverage at [email or web address], password [facility] 11/03/2013, 2:13 PM  LOS: 2 days

## 2013-11-03 NOTE — Progress Notes (Signed)
Patient ID: Hector House, male   DOB: [birth year], 68 y.o.   MRN: [id number]  The right radial cath site is stable. See the complete cardiac catheterization note from yesterday by Dr. Martinique. Cardiology will sign off.  Daryel November, MD

## 2013-11-03 NOTE — Discharge Instructions (Signed)

## 2013-11-04 MED ORDER — GUAIFENESIN ER 600 MG PO TB12: 600.0000 mg | ORAL_TABLET | Freq: Two times a day (BID) | ORAL | Status: DC

## 2013-11-04 MED ORDER — PREDNISONE 20 MG PO TABS: 40.0000 mg | ORAL_TABLET | Freq: Every day | ORAL | Status: DC

## 2013-11-04 MED ORDER — LEVOFLOXACIN 750 MG PO TABS: 750.0000 mg | ORAL_TABLET | ORAL | Status: DC

## 2013-11-04 NOTE — Discharge Summary (Signed)
Physician Discharge Summary  Hector House WNU:[id number] DOB: Jun 05, 1946 DOA: 11/01/2013  PCP: Pcp Not In System  Admit date: 11/01/2013 Discharge date: 11/04/2013  Time spent: 35 minutes  Recommendations for Outpatient Follow-up:  1. Follow up with PCP in 1 week 2. Follow up with Dr. Gwenette Greet in 1 week   Discharge Diagnoses:  Principal Problem:   Acute respiratory failure with hypoxia Active Problems:   TOBACCO USER   COPD exacerbation   Anxiety   Chest pain   Elevated troponin   Protein-calorie malnutrition, severe  Discharge Condition: stable  Diet recommendation: regular  Filed Weights   11/01/13 1250 11/02/13 1224 11/03/13 0005  Weight: 49 kg (108 lb 0.4 oz) 46.448 kg (102 lb 6.4 oz) 46.3 kg (102 lb 1.2 oz)   History of present illness:  Hector House is a 68 y.o. male with a Past Medical History of COPD, recently quit tobacco, was as discharged from this facility on 10/13/13 use who presents today with the above noted complaint. The patient, he was doing well since discharge, he claims he has not used tobacco since his discharge from this facility, started having some shortness of breath yesterday evening. Throughout the evening and overnight his shortness of breath worsened. As a result EMS was called, patient was very hypoxic initially, was placed on BiPAP and brought to the emergency room. He was given steroids nebulizer treatments, and was able to be weaned off the BiPAP. I was subsequently called to admit this patient for further evaluation and treatment. During my evaluation, patient also stated that he had left this sided chest pains that were going to his left shoulder area last night. He currently denied having any ongoing chest pains. He denies any nausea, vomiting, diarrhea. He denies any fever headaches as well.  Hospital Course:  Chest pain - patient's cardiac enzymes were cycled and showed troponin elevation. Cardiology has been consulted and given high risk for  coronary disease he underwent a left heart catheterization on 11/02/2013 which showed normal coronary anatoy and normal LV function. Given associated shortness of breath and troponin elevation and a negative cardiac workup, he had a CT chest without evidence of a PE.  AECOPD - patient was started on steroids, antibiotics, and breathing treatments with resolution of his wheezing. On discharge he is feeling back to baseline in respect to his respiratory status. He has a degree of anxiety when he gets a coughing spell which may play an important role. He was ambulated in the hallway to determine oxygen needs. His oxygen saturation was down to 94% with ambulation on room air but felt subjectively short of breath. He was discharged in stable condition and he is to complete Levofloxacin as an outpatient and a prednisone taper. He will see Dr. Gwenette Greet in Pulmonary clinic.  Acute respiratory failure with hypoxia - due to #2, resolved Tobacco abuse, in remission - quit during his last hospitalization a month ago.  Anxiety - continue home medications.  Procedures:  Cardiac cath 3/17   Consultations:  Cardiology   Discharge Exam: Filed Vitals:   11/03/13 1346 11/03/13 2139 11/04/13 0518 11/04/13 0800  BP:  125/62 138/74   Pulse:  83 74   Temp:  97.7 F (36.5 C) 97.8 F (36.6 C)   TempSrc:  Oral Oral   Resp:  18 18   Height:      Weight:      SpO2: 97% 98% 95% 95%   General: NAD Cardiovascular: RRR Respiratory: no wheezing,  distant breath sounds.   Discharge Instructions   Future Appointments Provider Department Dept Phone   11/09/2013 3:30 PM Eppie Gibson, MD [facility] [phone]   11/28/2013 3:00 PM Kathee Delton, MD [facility] [phone]       Medication List         PROAIR HFA 108 (90 BASE) MCG/ACT inhaler  Generic drug:  albuterol  INHALE 2 PUFFS BY MOUTH EVERY 4 TO 6 HOURS AS NEEDED     albuterol 108 (90 BASE) MCG/ACT inhaler    Commonly known as:  PROVENTIL HFA;VENTOLIN HFA  Inhale 2 puffs into the lungs every 4 (four) hours as needed for shortness of breath. INHALE 2 PUFFS BY MOUTH EVERY 4 TO 6 HOURS AS NEEDED     aspirin 81 MG tablet  Take 81 mg by mouth daily.     budesonide 0.5 MG/2ML nebulizer solution  Commonly known as:  PULMICORT  Take 0.5 mg by nebulization 2 (two) times daily.     busPIRone 5 MG tablet  Commonly known as:  BUSPAR  Take 1 tablet (5 mg total) by mouth 3 (three) times daily.     CENTRUM SILVER PO  Take 1 tablet by mouth daily.     guaiFENesin 600 MG 12 hr tablet  Commonly known as:  MUCINEX  Take 1 tablet (600 mg total) by mouth 2 (two) times daily.     ipratropium-albuterol 0.5-2.5 (3) MG/3ML Soln  Commonly known as:  DUONEB  Take 3 mLs by nebulization 4 (four) times daily. Can use an additional 2 treatments if needed.     levofloxacin 750 MG tablet  Commonly known as:  LEVAQUIN  Take 1 tablet (750 mg total) by mouth daily.     predniSONE 20 MG tablet  Commonly known as:  DELTASONE  Take 2 tablets (40 mg total) by mouth daily with breakfast. 2 tablets daily for 5 days then 1 tablet daily for 5 days then 1/2 tablet daily for 4 days.     sertraline 25 MG tablet  Commonly known as:  ZOLOFT  Take 1 tablet (25 mg total) by mouth daily.           Follow-up Information   Follow up with Kathee Delton, MD. Schedule an appointment as soon as possible for a visit in 1 week.   Specialty:  Pulmonary Disease   Contact information:   [place] [address] [phone]       The results of significant diagnostics from this hospitalization (including imaging, microbiology, ancillary and laboratory) are listed below for reference.    Significant Diagnostic Studies: Dg Chest 2 View (if Patient Has Fever And/or Copd)  10/10/2013   CLINICAL DATA:  Shortness of breath, cough. History of COPD and right thigh sarcoma.  EXAM: CHEST  2 VIEW  COMPARISON:  NM PET IMAGE  INITIAL (PI) SKULL BASE TO THIGH dated 09/29/2011; DG CHEST 2 VIEW dated 08/19/2011  FINDINGS: Cardiomediastinal silhouette is unremarkable. Markedly increased lung volumes with flattening of the hemidiaphragms, and mild chronic interstitial changes. The lungs are otherwise clear without pleural effusions or focal consolidations. Trachea projects midline and there is no pneumothorax. Soft tissue planes and included osseous structures are non-suspicious. Multiple EKG lines overlie the patient and may obscure subtle underlying pathology.  IMPRESSION: Severe COPD without superimposed acute cardiopulmonary process.   Electronically Signed   By: Elon Alas   On: 10/10/2013 01:17   Ct Angio Chest Pe W/cm &/  or Wo Cm  11/03/2013   CLINICAL DATA:  Shortness of breath. Chest pain. Emphysema. Cardiac catheterization yesterday. History of sarcoma of the right thigh, wide excision and postoperative radiation in 2012.  EXAM: CT ANGIOGRAPHY CHEST WITH CONTRAST  TECHNIQUE: Multidetector CT imaging of the chest was performed using the standard protocol during bolus administration of intravenous contrast. Multiplanar CT image reconstructions and MIPs were obtained to evaluate the vascular anatomy.  CONTRAST:  28mL OMNIPAQUE IOHEXOL 350 MG/ML SOLN  COMPARISON:  DG CHEST 1V PORT dated 11/01/2013; NM PET IMAGE INITIAL (PI) SKULL BASE TO THIGH dated 09/29/2011; CT CHEST W/CM dated 07/13/2010  FINDINGS: No filling defect is identified in the pulmonary arterial tree to suggest pulmonary embolus. No aortic dissection. Atherosclerotic calcification of the aortic arch and branch vessels. No pathologic is thoracic adenopathy.  Interventricular septal contour normal. Left adrenal adenoma. Possible right adrenal adenoma.  Prominent biapical pleural parenchymal scarring, stable from 2011. Severe emphysema. Mild bronchiectasis and dependent opacity in the posterior basal segment left lower lobe potentially from atelectasis or pneumonia.  Bilateral airway thickening. Mild airway plugging in the lower lobes. Scarring or atelectasis medially in the left lower lobe.  On image 65 of series 6, a 0.7 x 0.8 x 1.2 cm cavitary lesion is present with minimal nodularity inferiorly and medially. This has a relatively thin wall.  Review of the MIP images confirms the above findings.  IMPRESSION: 1. No embolus. 2. Severe emphysema. 3. Thin-walled cavitary lesion in the right middle lobe with slight nodularity inferiorly and medially, probably an inflammatory lesion involving several small adjacent bulla, or focus of saccular bronchiectasis. Cavitary neoplastic lesion is considered very unlikely. 4. Left and possible right adrenal adenomas. 5. Airway thickening with mild bronchiectasis in the lower lobes, and airway plugging in the lower lobes.   Electronically Signed   By: Sherryl Barters M.D.   On: 11/03/2013 09:58   Dg Chest Portable 1 View  11/01/2013   CLINICAL DATA:  Shortness of breath.  EXAM: PORTABLE CHEST - 1 VIEW  COMPARISON:  Chest x-ray 10/10/2013.  FINDINGS: Lungs are markedly hyperexpanded with flattening of the diaphragms and pruning of the pulmonary vasculature in the periphery, compatible with underlying emphysema. No acute consolidative airspace disease. No pleural effusions. No suspicious appearing pulmonary nodules or masses. No pneumothorax. No evidence of pulmonary edema. Heart size is normal. Mediastinal contours are unremarkable. Atherosclerosis in the thoracic aorta.  IMPRESSION: 1. Advanced emphysematous changes redemonstrated, without radiographic evidence of acute cardiopulmonary disease. 2. Atherosclerosis.   Electronically Signed   By: Vinnie Langton M.D.   On: 11/01/2013 04:26   Labs: Basic Metabolic Panel:  Recent Labs Lab 11/01/13 0440 11/01/13 1035 11/02/13 0315 11/03/13 0405  NA 140  --  141 139  K 3.5*  --  3.5* 3.3*  CL 102  --  100 100  CO2 24  --  27 29  GLUCOSE 121*  --  110* 87  BUN 17  --  9 14    CREATININE 0.72 0.56 0.62 0.65  CALCIUM 8.4  --  8.5 8.4   CBC:  Recent Labs Lab 11/01/13 0440 11/01/13 1035 11/02/13 0315 11/03/13 0405  WBC 7.9 9.2 6.6 12.2*  NEUTROABS 5.7  --   --   --   HGB 13.1 13.2 12.0* 12.3*  HCT 37.7* 38.4* 34.0* 35.2*  MCV 94.3 95.0 92.6 94.4  PLT 183 188 164 181   Cardiac Enzymes:  Recent Labs Lab 11/01/13 0440 11/01/13 1035 11/01/13 1528 11/02/13  0002  TROPONINI <0.30 0.44* 0.39* <0.30   BNP: BNP (last 3 results)  Recent Labs  11/01/13 0440  PROBNP 506.2*    Signed:  Marzetta Board  [facility] 11/04/2013, 2:52 PM

## 2013-11-05 ENCOUNTER — Other Ambulatory Visit: Payer: Self-pay | Admitting: Family Medicine

## 2013-11-05 ENCOUNTER — Telehealth: Payer: Self-pay | Admitting: Pulmonary Disease

## 2013-11-05 NOTE — Telephone Encounter (Signed)
Called and spoke with Malachy Mood from [facility]. requesting an order for resumption of care for skilled nursing. They follow pt d/t resp failure. Pt does not have PCP yet but is d/t f/u with a new one 11/15/13. Please advise [place] thanks

## 2013-11-05 NOTE — Telephone Encounter (Signed)
I do not order home care.  Needs to be done by his primary or discharging md from hospital .

## 2013-11-05 NOTE — Telephone Encounter (Signed)
I called and made [facility] aware. Nothing further needed

## 2013-11-06 ENCOUNTER — Ambulatory Visit (INDEPENDENT_AMBULATORY_CARE_PROVIDER_SITE_OTHER): Payer: Medicare Other | Admitting: Pulmonary Disease

## 2013-11-06 ENCOUNTER — Encounter: Payer: Self-pay | Admitting: Pulmonary Disease

## 2013-11-06 VITALS — BP 112/70 | HR 83 | Temp 98.5°F | Ht 68.0 in | Wt 109.0 lb

## 2013-11-06 DIAGNOSIS — J438 Other emphysema: Secondary | ICD-10-CM

## 2013-11-06 DIAGNOSIS — J439 Emphysema, unspecified: Secondary | ICD-10-CM

## 2013-11-06 NOTE — Progress Notes (Signed)
   Subjective:    Patient ID: Hector House, male    DOB: Jan 16, 1946, 68 y.o.   MRN: [id number]  HPI The patient comes in today for followup of his gold D. COPD. He was just recently hospitalized for a COPD exacerbation, after just being released 3 weeks ago for the same. The admission 3 weeks ago was associated with ongoing smoking, but the patient tells me that he has not smoked leading up to his most recent admission and discharge. His chest x-ray was unremarkable, but his CT chest didn't show some mucus plugging and no pulmonary embolus. He was treated with antibiotics and steroids, and improved. He is currently finishing up his prednisone taper and antibiotics. He tells me that he continues to stay away from cigarettes, but he is confused about his nebulized bronchodilators. I have reviewed this with him again in detail.   Review of Systems  Constitutional: Negative for fever and unexpected weight change.  HENT: Negative for congestion, dental problem, ear pain, nosebleeds, postnasal drip, rhinorrhea, sinus pressure, sneezing, sore throat and trouble swallowing.   Eyes: Negative for redness and itching.  Respiratory: Positive for cough, shortness of breath and wheezing. Negative for chest tightness.   Cardiovascular: Negative for palpitations and leg swelling.  Gastrointestinal: Negative for nausea and vomiting.  Genitourinary: Negative for dysuria.  Musculoskeletal: Negative for joint swelling.  Skin: Negative for rash.  Neurological: Negative for headaches.  Hematological: Does not bruise/bleed easily.  Psychiatric/Behavioral: Negative for dysphoric mood. The patient is not nervous/anxious.        Objective:   Physical Exam Thin male in no acute distress Nose without purulence or discharge noted Neck without lymphadenopathy or thyromegaly Chest with very distant breath sounds, no obvious wheezes or rhonchi Cardiac exam with distant heart sounds but regular Lower extremities with  no edema, no cyanosis Alert and oriented, moves all 4 extremities.       Assessment & Plan:

## 2013-11-06 NOTE — Patient Instructions (Signed)
Take duoneb (albuterol/ipratropium) neb treatment at breakfast, lunch, dinner, and bedtime everynight no matter what.  Can take 2 additional treatments during the day if having a "bad day". ADD budesonide (also known as pulmicort) to the morning and evening treatment everyday no matter what. Finish up prednisone and antibiotic per discharge instructions. Consider referral to pulmonary rehab, and I will be happy to refer you.  Let me know.  Stay on your mucinex Continue to stay off cigarettes as you are doing.  Even one a day is too much with your severe lung disease. followup with me again in 4 weeks.

## 2013-11-06 NOTE — Assessment & Plan Note (Signed)
The patient has had another acute exacerbation which required hospitalization, but this was not associated with ongoing smoking. He is much improved since being treated with antibiotics and steroids, and it is unclear what triggered this other than his very severe lung disease. I have asked him to continue on his nebulized bronchodilators, and have reviewed how to take them in detail. I also think he needs to stay on Mucinex as well. If he continues to have acute exacerbations, would consider adding daliresp as long as he stays off cigarettes. I have also asked him to consider referral to pulmonary rehabilitation.

## 2013-11-08 ENCOUNTER — Encounter: Payer: Self-pay | Admitting: Radiation Oncology

## 2013-11-09 ENCOUNTER — Encounter: Payer: Self-pay | Admitting: Radiation Oncology

## 2013-11-09 ENCOUNTER — Ambulatory Visit
Admission: RE | Admit: 2013-11-09 | Discharge: 2013-11-09 | Disposition: A | Discharge status: Home / Self Care | Payer: Medicare Other | Source: Ambulatory Visit | Attending: Radiation Oncology | Admitting: Radiation Oncology

## 2013-11-09 VITALS — BP 128/62 | HR 82 | Temp 98.2°F | Ht 68.0 in | Wt 109.1 lb

## 2013-11-09 DIAGNOSIS — C4920 Malignant neoplasm of connective and soft tissue of unspecified lower limb, including hip: Secondary | ICD-10-CM

## 2013-11-09 HISTORY — DX: Acute respiratory failure, unspecified whether with hypoxia or hypercapnia: J96.00

## 2013-11-09 NOTE — Progress Notes (Signed)
Mr. Deman here for assessment s/p radiation to his right thigh for a sarcoma.  He denies any pain today.   Not at rsik to fall when ambulating.  He recently stopped smoking after experiencing respiratory distress on 11/01/13.

## 2013-11-09 NOTE — Progress Notes (Signed)
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  No new lumps or bumps over his leg or groin. Stopped smoking last month due to scary episodes when he "stopped breathing".  He is in [place].                              ALLERGIES:  has No Known Allergies.  Meds: Current Outpatient Prescriptions  Medication Sig Dispense Refill  . aspirin 81 MG tablet Take 81 mg by mouth daily.      . budesonide (PULMICORT) 0.5 MG/2ML nebulizer solution Take 0.5 mg by nebulization 2 (two) times daily.      . busPIRone (BUSPAR) 5 MG tablet Take 1 tablet (5 mg total) by mouth 3 (three) times daily.  90 tablet  0  . guaiFENesin (MUCINEX) 600 MG 12 hr tablet Take 1 tablet (600 mg total) by mouth 2 (two) times daily.  20 tablet  0  . ipratropium-albuterol (DUONEB) 0.5-2.5 (3) MG/3ML SOLN Take 3 mLs by nebulization 4 (four) times daily. Can use an additional 2 treatments if needed.      Marland Kitchen levofloxacin (LEVAQUIN) 750 MG tablet Take 1 tablet (750 mg total) by mouth daily.  5 tablet  0  . Multiple Vitamins-Minerals (CENTRUM SILVER PO) Take 1 tablet by mouth daily.       . predniSONE (DELTASONE) 20 MG tablet Take 2 tablets (40 mg total) by mouth daily with breakfast. 2 tablets daily for 5 days then 1 tablet daily for 5 days then 1/2 tablet daily for 4 days.  17 tablet  0  . PROAIR HFA 108 (90 BASE) MCG/ACT inhaler INHALE 2 PUFFS BY MOUTH EVERY 4 TO 6 HOURS AS NEEDED  8.5 g  0  . sertraline (ZOLOFT) 25 MG tablet Take 1 tablet (25 mg total) by mouth daily.  30 tablet  0   Current Facility-Administered  Medications  Medication Dose Route Frequency Provider Last Rate Last Dose  . pneumococcal 23 valent vaccine (PNU-IMMUNE) injection 0.5 mL  0.5 mL Intramuscular Once Zainah Steven T [place], MD        Physical Findings: The patient is in no acute distress. Patient is alert and oriented.  height is 5\' 8"  (1.727 m) and weight is 109 lb 1.6 oz (49.487 kg). His temperature is 98.2 F (36.8 C). His blood pressure is 128/62 and his pulse is 82. .    No masses or skin changes over right thigh. No palpable nodes in R popliteal fossa or groin.   Lab Findings: Lab Results  Component Value Date   WBC 12.2* 11/03/2013   HGB 12.3* 11/03/2013   HCT 35.2* 11/03/2013   MCV 94.4 11/03/2013   PLT 181 11/03/2013    Radiographic Findings: Ct Angio Chest Pe W/cm &/or Wo Cm  11/03/2013   CLINICAL DATA:  Shortness of breath. Chest pain. Emphysema. Cardiac catheterization yesterday. History of sarcoma of the right thigh, wide excision and postoperative radiation in 2012.  EXAM: CT ANGIOGRAPHY CHEST WITH CONTRAST  TECHNIQUE: Multidetector  CT imaging of the chest was performed using the standard protocol during bolus administration of intravenous contrast. Multiplanar CT image reconstructions and MIPs were obtained to evaluate the vascular anatomy.  CONTRAST:  72mL OMNIPAQUE IOHEXOL 350 MG/ML SOLN  COMPARISON:  DG CHEST 1V PORT dated 11/01/2013; NM PET IMAGE INITIAL (PI) SKULL BASE TO THIGH dated 09/29/2011; CT CHEST W/CM dated 07/13/2010  FINDINGS: No filling defect is identified in the pulmonary arterial tree to suggest pulmonary embolus. No aortic dissection. Atherosclerotic calcification of the aortic arch and branch vessels. No pathologic is thoracic adenopathy.  Interventricular septal contour normal. Left adrenal adenoma. Possible right adrenal adenoma.  Prominent biapical pleural parenchymal scarring, stable from 2011. Severe emphysema. Mild bronchiectasis and dependent opacity in the posterior basal segment left lower lobe  potentially from atelectasis or pneumonia. Bilateral airway thickening. Mild airway plugging in the lower lobes. Scarring or atelectasis medially in the left lower lobe.  On image 65 of series 6, a 0.7 x 0.8 x 1.2 cm cavitary lesion is present with minimal nodularity inferiorly and medially. This has a relatively thin wall.  Review of the MIP images confirms the above findings.  IMPRESSION: 1. No embolus. 2. Severe emphysema. 3. Thin-walled cavitary lesion in the right middle lobe with slight nodularity inferiorly and medially, probably an inflammatory lesion involving several small adjacent bulla, or focus of saccular bronchiectasis. Cavitary neoplastic lesion is considered very unlikely. 4. Left and possible right adrenal adenomas. 5. Airway thickening with mild bronchiectasis in the lower lobes, and airway plugging in the lower lobes.   Electronically Signed   By: Sherryl Barters M.D.   On: 11/03/2013 09:58   Dg Chest Portable 1 View  11/01/2013   CLINICAL DATA:  Shortness of breath.  EXAM: PORTABLE CHEST - 1 VIEW  COMPARISON:  Chest x-ray 10/10/2013.  FINDINGS: Lungs are markedly hyperexpanded with flattening of the diaphragms and pruning of the pulmonary vasculature in the periphery, compatible with underlying emphysema. No acute consolidative airspace disease. No pleural effusions. No suspicious appearing pulmonary nodules or masses. No pneumothorax. No evidence of pulmonary edema. Heart size is normal. Mediastinal contours are unremarkable. Atherosclerosis in the thoracic aorta.  IMPRESSION: 1. Advanced emphysematous changes redemonstrated, without radiographic evidence of acute cardiopulmonary disease. 2. Atherosclerosis.   Electronically Signed   By: Vinnie Langton M.D.   On: 11/01/2013 04:26    Impression/Plan:  Doing well, NED.  I encouraged him to call if any concerns arise (ie new lumps or bumps over leg/groin). His risk of recurrence is very, very low.  I will see him back on an as-needed  basis.  I wished him the very best.  _____________________________________   Eppie Gibson, MD

## 2013-11-16 ENCOUNTER — Ambulatory Visit: Payer: Medicare Other | Admitting: Radiation Oncology

## 2013-11-19 ENCOUNTER — Telehealth: Payer: Self-pay | Admitting: Pulmonary Disease

## 2013-11-19 MED ORDER — ALBUTEROL SULFATE HFA 108 (90 BASE) MCG/ACT IN AERS: INHALATION_SPRAY | RESPIRATORY_TRACT | Status: DC

## 2013-11-19 NOTE — Telephone Encounter (Signed)
Spke with pt. He needed a refill on his rescue inhaler only. This has been sent. Nothing further needed

## 2013-11-23 ENCOUNTER — Encounter: Payer: Self-pay | Admitting: Family Medicine

## 2013-11-23 ENCOUNTER — Ambulatory Visit (INDEPENDENT_AMBULATORY_CARE_PROVIDER_SITE_OTHER): Payer: Medicare Other | Admitting: Family Medicine

## 2013-11-23 VITALS — BP 137/77 | HR 80 | Temp 98.0°F | Ht 69.0 in | Wt 107.1 lb

## 2013-11-23 DIAGNOSIS — Z7189 Other specified counseling: Secondary | ICD-10-CM

## 2013-11-23 DIAGNOSIS — Z7689 Persons encountering health services in other specified circumstances: Secondary | ICD-10-CM

## 2013-11-23 MED ORDER — SERTRALINE HCL 25 MG PO TABS: 25.0000 mg | ORAL_TABLET | Freq: Every day | ORAL | Status: DC

## 2013-11-23 MED ORDER — BUSPIRONE HCL 5 MG PO TABS: 5.0000 mg | ORAL_TABLET | Freq: Three times a day (TID) | ORAL | Status: DC

## 2013-11-23 NOTE — Patient Instructions (Signed)
Hector House, it was a pleasure seeing you today. Today we established care. We talked briefly about your COPD, your depression and your anxiety. I refilled your anxiety medication, buspirone and your depression medication, zoloft. You told me you are followed by Dr. Gwenette Greet at [facility]. Please keep your appointment on 4/22 to see him  Please make an appointment to see me in 4 weeks.  If you have any questions or concerns, please do not hesitate to call the office at [phone].  Sincerely,  [name], MD

## 2013-11-24 ENCOUNTER — Encounter: Payer: Self-pay | Admitting: Family Medicine

## 2013-11-24 ENCOUNTER — Emergency Department (HOSPITAL_COMMUNITY)
Admission: EM | Admit: 2013-11-24 | Discharge: 2013-11-25 | Disposition: A | Discharge status: Home / Self Care | Payer: Medicare Other | Attending: Emergency Medicine | Admitting: Emergency Medicine

## 2013-11-24 DIAGNOSIS — Z79899 Other long term (current) drug therapy: Secondary | ICD-10-CM | POA: Insufficient documentation

## 2013-11-24 DIAGNOSIS — F419 Anxiety disorder, unspecified: Secondary | ICD-10-CM

## 2013-11-24 DIAGNOSIS — F3289 Other specified depressive episodes: Secondary | ICD-10-CM | POA: Insufficient documentation

## 2013-11-24 DIAGNOSIS — R0602 Shortness of breath: Secondary | ICD-10-CM

## 2013-11-24 DIAGNOSIS — Z87891 Personal history of nicotine dependence: Secondary | ICD-10-CM | POA: Insufficient documentation

## 2013-11-24 DIAGNOSIS — I10 Essential (primary) hypertension: Secondary | ICD-10-CM | POA: Insufficient documentation

## 2013-11-24 DIAGNOSIS — M129 Arthropathy, unspecified: Secondary | ICD-10-CM | POA: Insufficient documentation

## 2013-11-24 DIAGNOSIS — F411 Generalized anxiety disorder: Secondary | ICD-10-CM | POA: Insufficient documentation

## 2013-11-24 DIAGNOSIS — Z85828 Personal history of other malignant neoplasm of skin: Secondary | ICD-10-CM | POA: Insufficient documentation

## 2013-11-24 DIAGNOSIS — Z7982 Long term (current) use of aspirin: Secondary | ICD-10-CM | POA: Insufficient documentation

## 2013-11-24 DIAGNOSIS — F329 Major depressive disorder, single episode, unspecified: Secondary | ICD-10-CM | POA: Insufficient documentation

## 2013-11-24 DIAGNOSIS — J441 Chronic obstructive pulmonary disease with (acute) exacerbation: Secondary | ICD-10-CM

## 2013-11-24 DIAGNOSIS — Z923 Personal history of irradiation: Secondary | ICD-10-CM | POA: Insufficient documentation

## 2013-11-24 NOTE — ED Notes (Signed)
Patient arrives via EMS from home with complaint of shortness of breath. EMS gave Duoneb and patient improved. Per EMS patient has significant history of anxiety and reported symptoms appeared to be precipitated by anxiety.

## 2013-11-24 NOTE — ED Provider Notes (Signed)
CSN: [id number]     Arrival date & time 11/24/13  2322 History   First MD Initiated Contact with Patient 11/24/13 2332     Chief Complaint  Patient presents with  . Respiratory Distress     (Consider location/radiation/quality/duration/timing/severity/associated sxs/prior Treatment) HPI  This patient is a pleasant 68 year old man with COPD. He presents via EMS after developing shortness of breath at home. The patient was conversing with family members and talking about all of the activities he used to be able to enjoy before his diagnosis of COPD. He says he became very emotional and short of breath. He does not recall wheezing. He has a chronic productive cough. No fever or chest pain.   An IV was placed on route but the patient did not receive any medications. He is feeling better at this time. He has been using his albuterol HFA as usual-about 4-5 times per day. No new medications. The patient has a 100-pack-year smoking history but quit smoking 2 years ago.  Past Medical History  Diagnosis Date  . Hypertension   . Cancer     skin  . Arthritis   . Low back pain syndrome     Joint Pain  . Emphysema   . S/P radiation therapy 07/23/10 - 09/14/10    Right Thigh for Leiomyosarcoma / 61.2 Gy / 34 Fractions  . Leiomyosarcoma of thigh 2011  . Respiratory failure, acute 3/276/15 -11/04/13     Hospitalized  . Depression    Past Surgical History  Procedure Laterality Date  . Laminectomy  2008    Three back surgeries per patient medical history form dated 05/06/10.  Marland Kitchen Elbow surgery  1967    L  . Leg skin lesion  biopsy / excision  04/20/10 / re-excision 06/18/10     Right thigh then re-excision of Lymph node right \\grion  06/18/10  . Back surgery    . Elbow surgery Left    Family History  Problem Relation Age of Onset  . Heart disease Mother     heart attack   History  Substance Use Topics  . Smoking status: Former Smoker -- 2.00 packs/day for 50 years    Types: Cigarettes   Quit date: 10/10/2013  . Smokeless tobacco: Never Used     Comment: Pt states QUIT 11/29/12-2 ppd-- Pt states he officially quit 10/10/13  . Alcohol Use: 7.2 oz/week    12 Cans of beer per week     Comment: Consumption per week.    Review of Systems Ten point review of symptoms performed and is negative with the exception of symptoms noted above. Also expresses depression due to activity limitations from COPD.     Allergies  Review of patient's allergies indicates no known allergies.  Home Medications   Prior to Admission medications   Medication Sig Start Date End Date Taking? Authorizing Provider  albuterol (PROAIR HFA) 108 (90 BASE) MCG/ACT inhaler INHALE 2 PUFFS BY MOUTH EVERY 4 TO 6 HOURS AS NEEDED 11/19/13   Kathee Delton, MD  aspirin 81 MG tablet Take 81 mg by mouth daily. 01/29/13   Jayce G Cook, DO  budesonide (PULMICORT) 0.5 MG/2ML nebulizer solution Take 0.5 mg by nebulization 2 (two) times daily. 04/02/13   Kathee Delton, MD  busPIRone (BUSPAR) 5 MG tablet Take 1 tablet (5 mg total) by mouth 3 (three) times daily. 11/23/13   [name], MD  guaiFENesin (MUCINEX) 600 MG 12 hr tablet Take 1 tablet (600 mg total) by mouth  2 (two) times daily. 11/04/13   Costin Karlyne Greenspan, MD  ipratropium-albuterol (DUONEB) 0.5-2.5 (3) MG/3ML SOLN Take 3 mLs by nebulization 4 (four) times daily. Can use an additional 2 treatments if needed. 04/02/13   Kathee Delton, MD  levofloxacin (LEVAQUIN) 750 MG tablet Take 1 tablet (750 mg total) by mouth daily. 11/04/13   Costin Karlyne Greenspan, MD  Multiple Vitamins-Minerals (CENTRUM SILVER PO) Take 1 tablet by mouth daily.     Historical Provider, MD  predniSONE (DELTASONE) 20 MG tablet Take 2 tablets (40 mg total) by mouth daily with breakfast. 2 tablets daily for 5 days then 1 tablet daily for 5 days then 1/2 tablet daily for 4 days. 11/04/13   Costin Karlyne Greenspan, MD  sertraline (ZOLOFT) 25 MG tablet Take 1 tablet (25 mg total) by mouth daily. 11/23/13   [name],  MD   SpO2 96% Physical Exam Gen: well developed and thin appearing Head: NCAT Eyes: PERL, EOMI Nose: no epistaixis or rhinorrhea Mouth/throat: mucosa is moist and pink Neck: supple, no stridor Lungs: poor air exchange bilaterally, no wheezing, rhonchi or rales, RR 20/min. Pulse ox 96% on RA CV: regular rate and rythm, good distal pulses.  Abd: soft, notender, nondistended Back: no ttp, no cva ttp Skin: warm and dry Ext: no edema, normal to inspection Neuro: CN ii-xii grossly intact, no focal deficits Psyche; normal affect,  calm and cooperative.  ED Course  Procedures (including critical care time) Labs Review  DG Chest 2 View (Final result)  Result time: 11/25/13 00:22:33    Final result by Rad Results In Interface (11/25/13 00:22:33)    Narrative:   CLINICAL DATA: Cough, shortness of breath, chest pain.  EXAM: CHEST 2 VIEW  COMPARISON: 11/01/2013  FINDINGS: Prominent emphysematous changes and scattered fibrosis in the lungs. Normal heart size and pulmonary vascularity. No focal airspace disease. No blunting of costophrenic angles. No pneumothorax. No change since previous study.  IMPRESSION: Severe emphysema and scattered fibrosis. No active disease.   Electronically Signed By: Lucienne Capers M.D. On: 11/25/2013 00:22     MDM   DDX: COPD, anxiety, pneumonia, ptx, pleural effusion.    Patient without respiratory distress on arrival and normal RA sats. No wheezing but diminished air exchange. We will tx with duoneb and prednisone. Patient ambulated to bathroom. Felt SOB when he got back to bed but sats 94%. SOB resolved after < 1m back in bed. Patient says this is baseline. We plan to discharge .     Elyn Peers, MD 11/25/13 [phone]

## 2013-11-24 NOTE — Progress Notes (Signed)
   Subjective:    Patient ID: Hector House, male    DOB: 01-07-1946, 68 y.o.   MRN: [id number]  HPI  Patient presents for a new patient visit to establish care. He is currently followed by Dr. Gwenette Greet at [facility]. He has concerns regarding the use of oxygen at home and feels like he needs this, although his pulmonologist has said he does not need oxygen. He has recently been admitted to the hospital for a COPD exacerbation and feels like his condition is worsening. He states he received oxygen in the hospital, which helped his symptoms dramatically. He has follow-up with Dr. Gwenette Greet on 4/22 and will bring up his concerns on that date. Patient has a history of depression and anxiety, currently taking sertraline and buspirone.  PMH: Reviewed and updated in patient's record PSH: Reviewed and updated in patient's record  Social history: former smoker (updated in patient's record)  Allergies: No known allergies  Review of Systems  HENT: Positive for dental problem.   Respiratory: Positive for shortness of breath and wheezing. Negative for cough.   Cardiovascular: Negative for chest pain.  All other systems reviewed and are negative.      Objective:   Physical Exam  Constitutional: He is oriented to person, place, and time. He appears well-developed and well-nourished. No distress.  Eyes: EOM are normal. Pupils are equal, round, and reactive to light.  Cardiovascular: Normal rate, regular rhythm and normal heart sounds.   No murmur heard. Pulmonary/Chest: Effort normal. No respiratory distress. He has no wheezes.  Abdominal: Soft. Bowel sounds are normal. He exhibits no distension. There is no tenderness.  Musculoskeletal: Normal range of motion.  Neurological: He is alert and oriented to person, place, and time. No cranial nerve deficit.  Skin: Skin is warm and dry.  Psychiatric: He has a normal mood and affect. His speech is normal and behavior is normal. He expresses no  suicidal ideation.        Assessment & Plan:

## 2013-11-24 NOTE — Assessment & Plan Note (Signed)
Refill for 30 days until patient returns for follow-up visit

## 2013-11-24 NOTE — ED Notes (Signed)
Cheri Guppy MD at bedside.

## 2013-11-25 ENCOUNTER — Emergency Department (HOSPITAL_COMMUNITY): Payer: Medicare Other

## 2013-11-25 ENCOUNTER — Other Ambulatory Visit: Payer: Self-pay

## 2013-11-25 LAB — CBC WITH DIFFERENTIAL/PLATELET
Basophils Absolute: 0 10*3/uL (ref 0.0–0.1)
Basophils Relative: 0 % (ref 0–1)
EOS ABS: 0.1 10*3/uL (ref 0.0–0.7)
Eosinophils Relative: 1 % (ref 0–5)
HCT: 39.9 % (ref 39.0–52.0)
HEMOGLOBIN: 13.6 g/dL (ref 13.0–17.0)
LYMPHS ABS: 0.8 10*3/uL (ref 0.7–4.0)
LYMPHS PCT: 10 % — AB (ref 12–46)
MCH: 32.4 pg (ref 26.0–34.0)
MCHC: 34.1 g/dL (ref 30.0–36.0)
MCV: 95 fL (ref 78.0–100.0)
MONOS PCT: 11 % (ref 3–12)
Monocytes Absolute: 1 10*3/uL (ref 0.1–1.0)
Neutro Abs: 6.5 10*3/uL (ref 1.7–7.7)
Neutrophils Relative %: 78 % — ABNORMAL HIGH (ref 43–77)
PLATELETS: 150 10*3/uL (ref 150–400)
RBC: 4.2 MIL/uL — AB (ref 4.22–5.81)
RDW: 14 % (ref 11.5–15.5)
WBC: 8.4 10*3/uL (ref 4.0–10.5)

## 2013-11-25 LAB — CBG MONITORING, ED: Glucose-Capillary: 104 mg/dL — ABNORMAL HIGH (ref 70–99)

## 2013-11-25 LAB — BASIC METABOLIC PANEL
BUN: 8 mg/dL (ref 6–23)
CO2: 25 meq/L (ref 19–32)
CREATININE: 0.65 mg/dL (ref 0.50–1.35)
Calcium: 9.3 mg/dL (ref 8.4–10.5)
Chloride: 97 mEq/L (ref 96–112)
GFR calc Af Amer: >90 mL/min (ref >90)
GFR calc non Af Amer: >90 mL/min (ref >90)
Glucose, Bld: 101 mg/dL — ABNORMAL HIGH (ref 70–99)
POTASSIUM: 3.3 meq/L — AB (ref 3.7–5.3)
SODIUM: 137 meq/L (ref 137–147)

## 2013-11-25 LAB — I-STAT TROPONIN, ED: Troponin i, poc: 0.03 ng/mL (ref 0.00–0.08)

## 2013-11-25 MED ORDER — PREDNISONE 20 MG PO TABS
60.0000 mg | ORAL_TABLET | Freq: Once | ORAL | Status: AC
  Administered 2013-11-25: 60 mg via ORAL
  Filled 2013-11-25: qty 3

## 2013-11-25 MED ORDER — IPRATROPIUM-ALBUTEROL 0.5-2.5 (3) MG/3ML IN SOLN
3.0000 mL | Freq: Once | RESPIRATORY_TRACT | Status: AC
  Administered 2013-11-25: 3 mL via RESPIRATORY_TRACT
  Filled 2013-11-25: qty 3

## 2013-11-25 MED ORDER — PREDNISONE 20 MG PO TABS: ORAL_TABLET | ORAL | Status: DC

## 2013-11-25 NOTE — ED Notes (Signed)
Pt state PNA vaccine done 4 to 5 months ago

## 2013-11-25 NOTE — ED Notes (Signed)
Pt ambulated to bathroom and back. O2 saturation dropped to 93%. Pt sat on Edge of bed in tripod position and o2 Saturation raised to 96%

## 2013-11-25 NOTE — Discharge Instructions (Signed)
Chronic Obstructive Pulmonary Disease  Chronic obstructive pulmonary disease (COPD) is a common lung condition in which airflow from the lungs is limited. COPD is a general term that can be used to describe many different lung problems that limit airflow, including both chronic bronchitis and emphysema.  If you have COPD, your lung function will probably never return to normal, but there are measures you can take to improve lung function and make yourself feel better.   CAUSES   · Smoking (common).    · Exposure to secondhand smoke.    · Genetic problems.  · Chronic inflammatory lung diseases or recurrent infections.  SYMPTOMS   · Shortness of breath, especially with physical activity.    · Deep, persistent (chronic) cough with a large amount of thick mucus.    · Wheezing.    · Rapid breaths (tachypnea).    · Gray or bluish discoloration (cyanosis) of the skin, especially in fingers, toes, or lips.    · Fatigue.    · Weight loss.    · Frequent infections or episodes when breathing symptoms become much worse (exacerbations).    · Chest tightness.  DIAGNOSIS   Your healthcare provider will take a medical history and perform a physical examination to make the initial diagnosis.  Additional tests for COPD may include:   · Lung (pulmonary) function tests.  · Chest X-ray.  · CT scan.  · Blood tests.  TREATMENT   Treatment available to help you feel better when you have COPD include:   · Inhaler and nebulizer medicines. These help manage the symptoms of COPD and make your breathing more comfortable  · Supplemental oxygen. Supplemental oxygen is only helpful if you have a low oxygen level in your blood.    · Exercise and physical activity. These are beneficial for nearly all people with COPD. Some people may also benefit from a pulmonary rehabilitation program.  HOME CARE INSTRUCTIONS   · Take all medicines (inhaled or pills) as directed by your health care provider.  · Only take over-the-counter or prescription medicines  for pain, fever, or discomfort as directed by your health care provider.    · Avoid over-the-counter medicines or cough syrups that dry up your airway (such as antihistamines) and slow down the elimination of secretions unless instructed otherwise by your healthcare provider.    · If you are a smoker, the most important thing that you can do is stop smoking. Continuing to smoke will cause further lung damage and breathing trouble. Ask your health care provider for help with quitting smoking. He or she can direct you to community resources or hospitals that provide support.  · Avoid exposure to irritants such as smoke, chemicals, and fumes that aggravate your breathing.  · Use oxygen therapy and pulmonary rehabilitation if directed by your health care provider. If you require home oxygen therapy, ask your healthcare provider whether you should purchase a pulse oximeter to measure your oxygen level at home.    · Avoid contact with individuals who have a contagious illness.  · Avoid extreme temperature and humidity changes.  · Eat healthy foods. Eating smaller, more frequent meals and resting before meals may help you maintain your strength.  · Stay active, but balance activity with periods of rest. Exercise and physical activity will help you maintain your ability to do things you want to do.  · Preventing infection and hospitalization is very important when you have COPD. Make sure to receive all the vaccines your health care provider recommends, especially the pneumococcal and influenza vaccines. Ask your healthcare provider whether you   need a pneumonia vaccine.  · Learn and use relaxation techniques to manage stress.  · Learn and use controlled breathing techniques as directed by your health care provider. Controlled breathing techniques include:    · Pursed lip breathing. Start by breathing in (inhaling) through your nose for 1 second. Then, purse your lips as if you were going to whistle and breathe out (exhale)  through the pursed lips for 2 seconds.    · Diaphragmatic breathing. Start by putting one hand on your abdomen just above your waist. Inhale slowly through your nose. The hand on your abdomen should move out. Then purse your lips and exhale slowly. You should be able to feel the hand on your abdomen moving in as you exhale.    · Learn and use controlled coughing to clear mucus from your lungs. Controlled coughing is a series of short, progressive coughs. The steps of controlled coughing are:    1. Lean your head slightly forward.    2. Breathe in deeply using diaphragmatic breathing.    3. Try to hold your breath for 3 seconds.    4. Keep your mouth slightly open while coughing twice.    5. Spit any mucus out into a tissue.    6. Rest and repeat the steps once or twice as needed.  SEEK MEDICAL CARE IF:   · You are coughing up more mucus than usual.    · There is a change in the color or thickness of your mucus.    · Your breathing is more labored than usual.    · Your breathing is faster than usual.    SEEK IMMEDIATE MEDICAL CARE IF:   · You have shortness of breath while you are resting.    · You have shortness of breath that prevents you from:  · Being able to talk.    · Performing your usual physical activities.    · You have chest pain lasting longer than 5 minutes.    · Your skin color is more cyanotic than usual.  · You measure low oxygen saturations for longer than 5 minutes with a pulse oximeter.  MAKE SURE YOU:   · Understand these instructions.  · Will watch your condition.  · Will get help right away if you are not doing well or get worse.  Document Released: 05/05/2005 Document Revised: 05/16/2013 Document Reviewed: 03/22/2013  [employer]® Patient Information ©2014 [employer].

## 2013-11-25 NOTE — ED Notes (Signed)
Pt states understanding of discharge instructions 

## 2013-11-25 NOTE — ED Notes (Signed)
Pt receiving breathing treatment.

## 2013-11-28 ENCOUNTER — Ambulatory Visit (INDEPENDENT_AMBULATORY_CARE_PROVIDER_SITE_OTHER): Payer: Medicare Other | Admitting: Pulmonary Disease

## 2013-11-28 ENCOUNTER — Encounter: Payer: Self-pay | Admitting: Pulmonary Disease

## 2013-11-28 VITALS — BP 122/80 | HR 89 | Temp 98.3°F | Ht 68.0 in | Wt 108.0 lb

## 2013-11-28 DIAGNOSIS — J439 Emphysema, unspecified: Secondary | ICD-10-CM

## 2013-11-28 DIAGNOSIS — J438 Other emphysema: Secondary | ICD-10-CM

## 2013-11-28 MED ORDER — BUSPIRONE HCL 10 MG PO TABS: 10.0000 mg | ORAL_TABLET | Freq: Two times a day (BID) | ORAL | Status: DC

## 2013-11-28 MED ORDER — ALPRAZOLAM 0.5 MG PO TABS: 0.5000 mg | ORAL_TABLET | Freq: Two times a day (BID) | ORAL | Status: DC

## 2013-11-28 NOTE — Patient Instructions (Signed)
Will change your buspirone to 10mg , and take twice a day  (can take 2 of the 5mg  until they are used up) Start xanax 0.5mg  one in am and pm No change in breathing meds Push calories everyday to build muscle mass Really think about pulmonary rehab, and let me know what you think followup with me again in 57mos, but call if having issues.

## 2013-11-28 NOTE — Progress Notes (Signed)
   Subjective:    Patient ID: Hector House, male    DOB: September 25, 1945, 68 y.o.   MRN: [id number]  HPI The patient comes in today for followup of his known severe COPD. He continues to have significant breathing issues, and he believes anxiety is driving this. I suspect he is right. He has not smoked since the last visit, and has been staying on his medications compliantly. He denies any significant congestion or purulence. He has not had worsening lower extremity edema.   Review of Systems  Constitutional: Negative for fever and unexpected weight change.  HENT: Negative for congestion, dental problem, ear pain, nosebleeds, postnasal drip, rhinorrhea, sinus pressure, sneezing, sore throat and trouble swallowing.   Eyes: Negative for redness and itching.  Respiratory: Positive for apnea and shortness of breath. Negative for cough, chest tightness and wheezing.        Increased SOB d/t anxiety?  Cardiovascular: Negative for palpitations and leg swelling.  Gastrointestinal: Negative for nausea and vomiting.  Genitourinary: Negative for dysuria.  Musculoskeletal: Negative for joint swelling.  Skin: Negative for rash.  Neurological: Negative for headaches.  Hematological: Does not bruise/bleed easily.  Psychiatric/Behavioral: Negative for dysphoric mood. The patient is nervous/anxious.        Objective:   Physical Exam Thin male in no acute distress Nose without purulence or discharge noted Neck without lymphadenopathy or thyromegaly Chest with very diminished breath sounds, no active wheezing Cardiac exam with regular rate and rhythm Lower extremities without edema, no cyanosis Alert and oriented, moves all 4 extremities.       Assessment & Plan:

## 2013-11-28 NOTE — Assessment & Plan Note (Signed)
The patient has very severe COPD, and is continuing to have ongoing breathing issues on a daily basis. He thinks a lot of this is being driven by anxiety, and he certainly may the right. I am also concerned about his lack of muscle mass, as well as conditioning. I have been trying to get him to attend pulmonary rehabilitation, and he will think about this some more. He does not have any bronchospasm on exam today, and his sats at rest are adequate. We will check ambulatory oximetry today.

## 2013-11-29 ENCOUNTER — Telehealth: Payer: Self-pay | Admitting: Surgery

## 2013-11-29 NOTE — Telephone Encounter (Signed)
Pt had f/u with  Dr. Gwenette Greet on 11/28/13

## 2013-11-29 NOTE — Telephone Encounter (Signed)
Message copied by Laurena Slimmer on Thu Nov 29, 2013 11:40 AM ------      Message from: Elyn Peers      Created: Sun Nov 25, 2013  1:11 AM      Regarding: Order for [facility]             Patient Name: Hector House, Hector House([id number])      Sex: Male      DOB: 1946/05/01              PCP: NETTEY, [place]: [facility]             Types of orders made on 11/25/2013: Consult, ECG, Medications, Nursing            Order Date:11/25/2013      Ordering Thressa Sheller [[id number]]      Attending Provider:Julie Cheri Guppy, MD [phone]      Authorizing Provider: Elyn Peers, MD [[id number]]      Department:[facility][[id number]]            Order Specific Information      Order: COPD clinic follow up [Custom: [id number]]  Order #: [id number]  Qty: 1        Priority: STAT  Class: Hospital Performed          Where does the patient receive follow up COPD care -> Other                 Follow up in -> 5-7 days               Released on: 11/25/2013  1:11 AM                    Priority: STAT  Class: Hospital Performed          Where does the patient receive follow up COPD care -> Other                 Follow up in -> 5-7 days               Released on: 11/25/2013  1:11 AM       ------

## 2013-12-01 LAB — CULTURE, BLOOD (ROUTINE X 2)
Culture: NO GROWTH
Culture: NO GROWTH

## 2013-12-05 ENCOUNTER — Telehealth: Payer: Self-pay | Admitting: Family Medicine

## 2013-12-05 NOTE — Telephone Encounter (Signed)
Need verbal order for contiunace of home health services for next 60 days with Hector House.  Please call Eli Phillips for confirmation.

## 2013-12-05 NOTE — Telephone Encounter (Signed)
Will forward to PCP 

## 2013-12-06 ENCOUNTER — Telehealth: Payer: Self-pay | Admitting: Pulmonary Disease

## 2013-12-06 NOTE — Telephone Encounter (Signed)
Called and spoke with Hector House and she is aware that we have faxed over the [facility] note to her.  Nothing further is needed.

## 2013-12-09 ENCOUNTER — Other Ambulatory Visit: Payer: Self-pay | Admitting: Pulmonary Disease

## 2013-12-11 ENCOUNTER — Telehealth: Payer: Self-pay | Admitting: Pulmonary Disease

## 2013-12-11 ENCOUNTER — Telehealth: Payer: Self-pay | Admitting: Family Medicine

## 2013-12-11 ENCOUNTER — Other Ambulatory Visit: Payer: Self-pay | Admitting: *Deleted

## 2013-12-11 MED ORDER — ALBUTEROL SULFATE HFA 108 (90 BASE) MCG/ACT IN AERS: INHALATION_SPRAY | RESPIRATORY_TRACT | Status: DC

## 2013-12-11 NOTE — Telephone Encounter (Signed)
Spoke with nancy walker and gave verbal orders. She was also stating that patient needed refills on other medications. pulmonologist was prescribing medications for patient due to him not having a PCP now that he does will you now prescribe. I will send you a refill request with those medications.

## 2013-12-11 NOTE — Telephone Encounter (Signed)
Hector House called back and said that Dr. Buck Mam office did call in [place]. Hector House wanted to speak to Dr. Teryl Lucy to see if he would take over the medication list for Hector House. They have a lot of issues getting a response from Dr. Buck Mam office and the pt can be without medication. Please call Hector House at [phone] to discuss this. jw

## 2013-12-11 NOTE — Telephone Encounter (Signed)
Please advise.Thank you.[facility] S [place] No  

## 2013-12-11 NOTE — Telephone Encounter (Signed)
Spoke with Izora Gala, nurse who is there with the pt now  She states that pt lost his proair inhaler and needs new rx sent  This was done and nothing further needed

## 2013-12-11 NOTE — Telephone Encounter (Signed)
Hector House called and she is a home care nurse for [place]. He is out of his ProAir HFA. Please call Hector House at [phone] to let her know if this is possible. jw

## 2013-12-13 MED ORDER — GUAIFENESIN ER 600 MG PO TB12: 600.0000 mg | ORAL_TABLET | Freq: Two times a day (BID) | ORAL | Status: DC

## 2013-12-13 MED ORDER — ASPIRIN 81 MG PO TABS: 81.0000 mg | ORAL_TABLET | Freq: Every day | ORAL | Status: DC

## 2013-12-13 MED ORDER — SERTRALINE HCL 25 MG PO TABS: 25.0000 mg | ORAL_TABLET | Freq: Every day | ORAL | Status: DC

## 2013-12-13 NOTE — Telephone Encounter (Signed)
Spoke with Dr. Lonny Prude and I am going to refill 3 rx's aspirin, mucinex, and zoloft for one month. Patient has an upcoming appointment on 5/15 with Dr. Lonny Prude and other refills and medications will be discussed at that visit.

## 2013-12-18 IMAGING — CR DG CHEST 2V
2 series · 2 of 2 positions shown · non-contrast
Comparison: CT 07/13/2010.

CLINICAL DATA: COPD, shortness of breath.

CHEST - 2 VIEW

[view not recorded (1 of 2)]
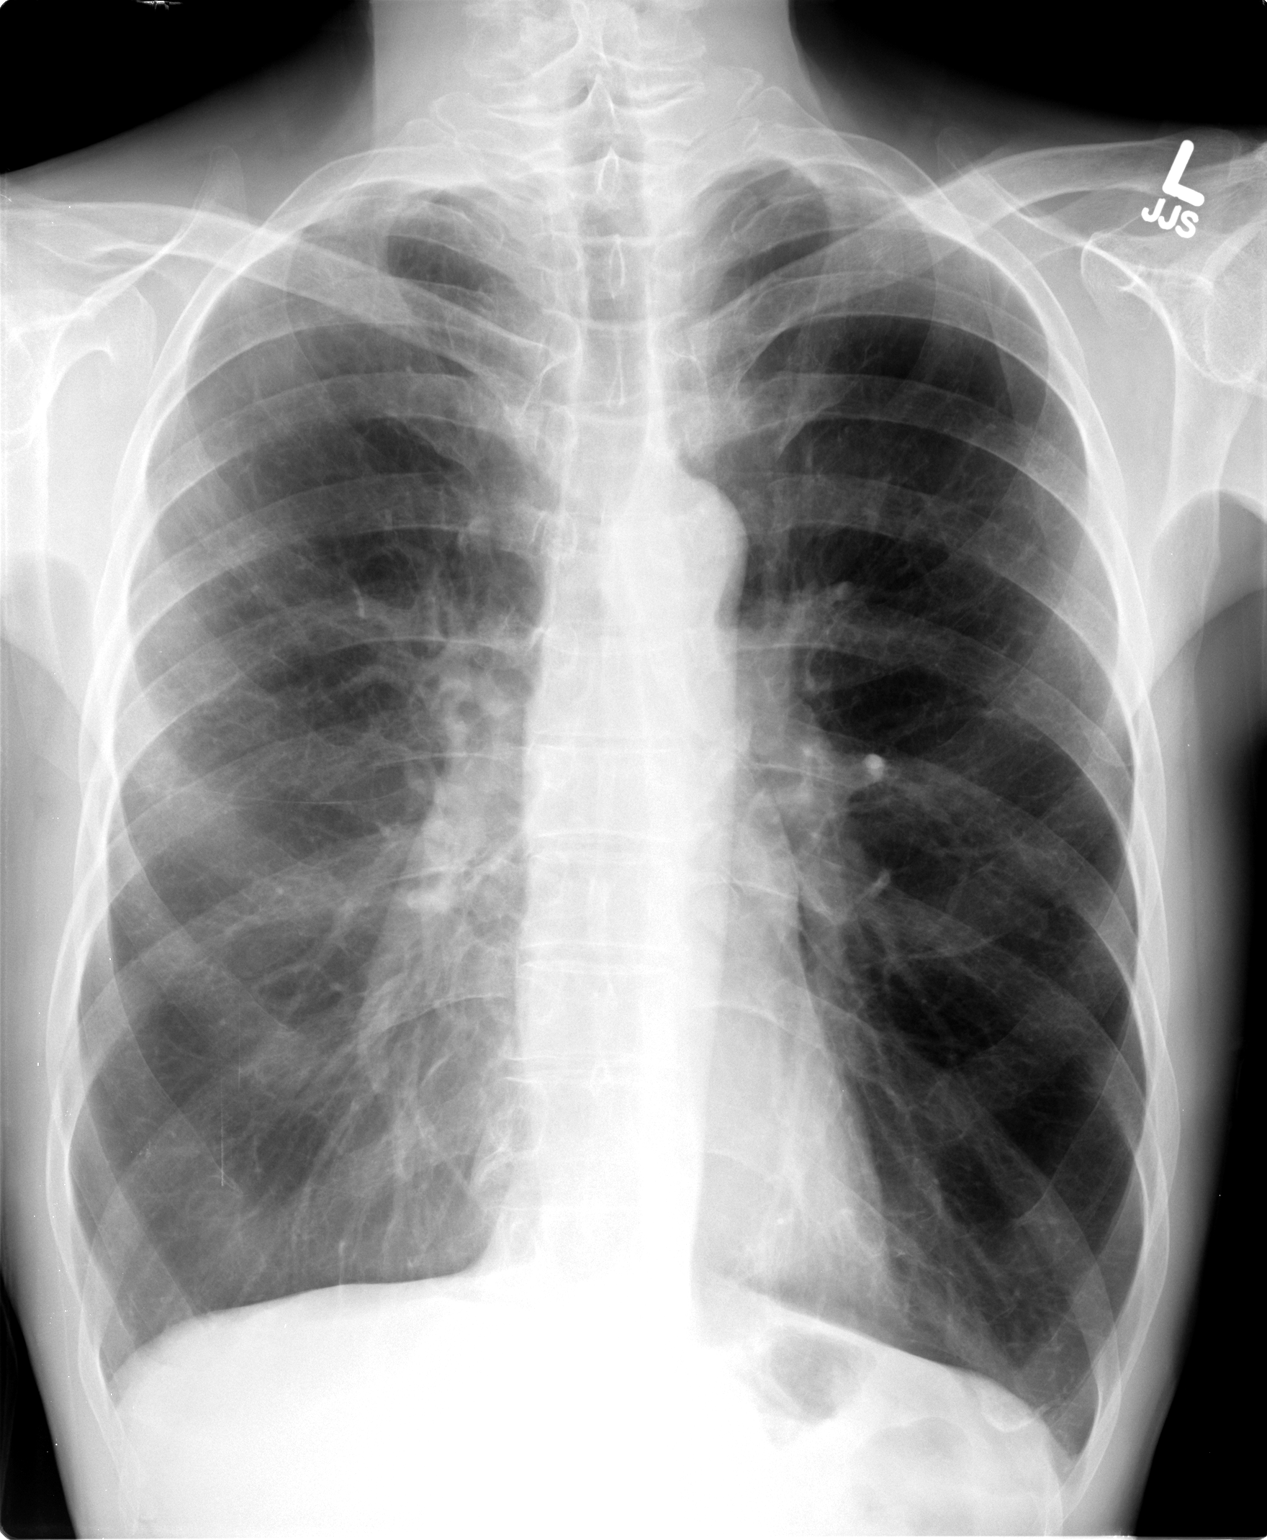

[view not recorded (2 of 2)]
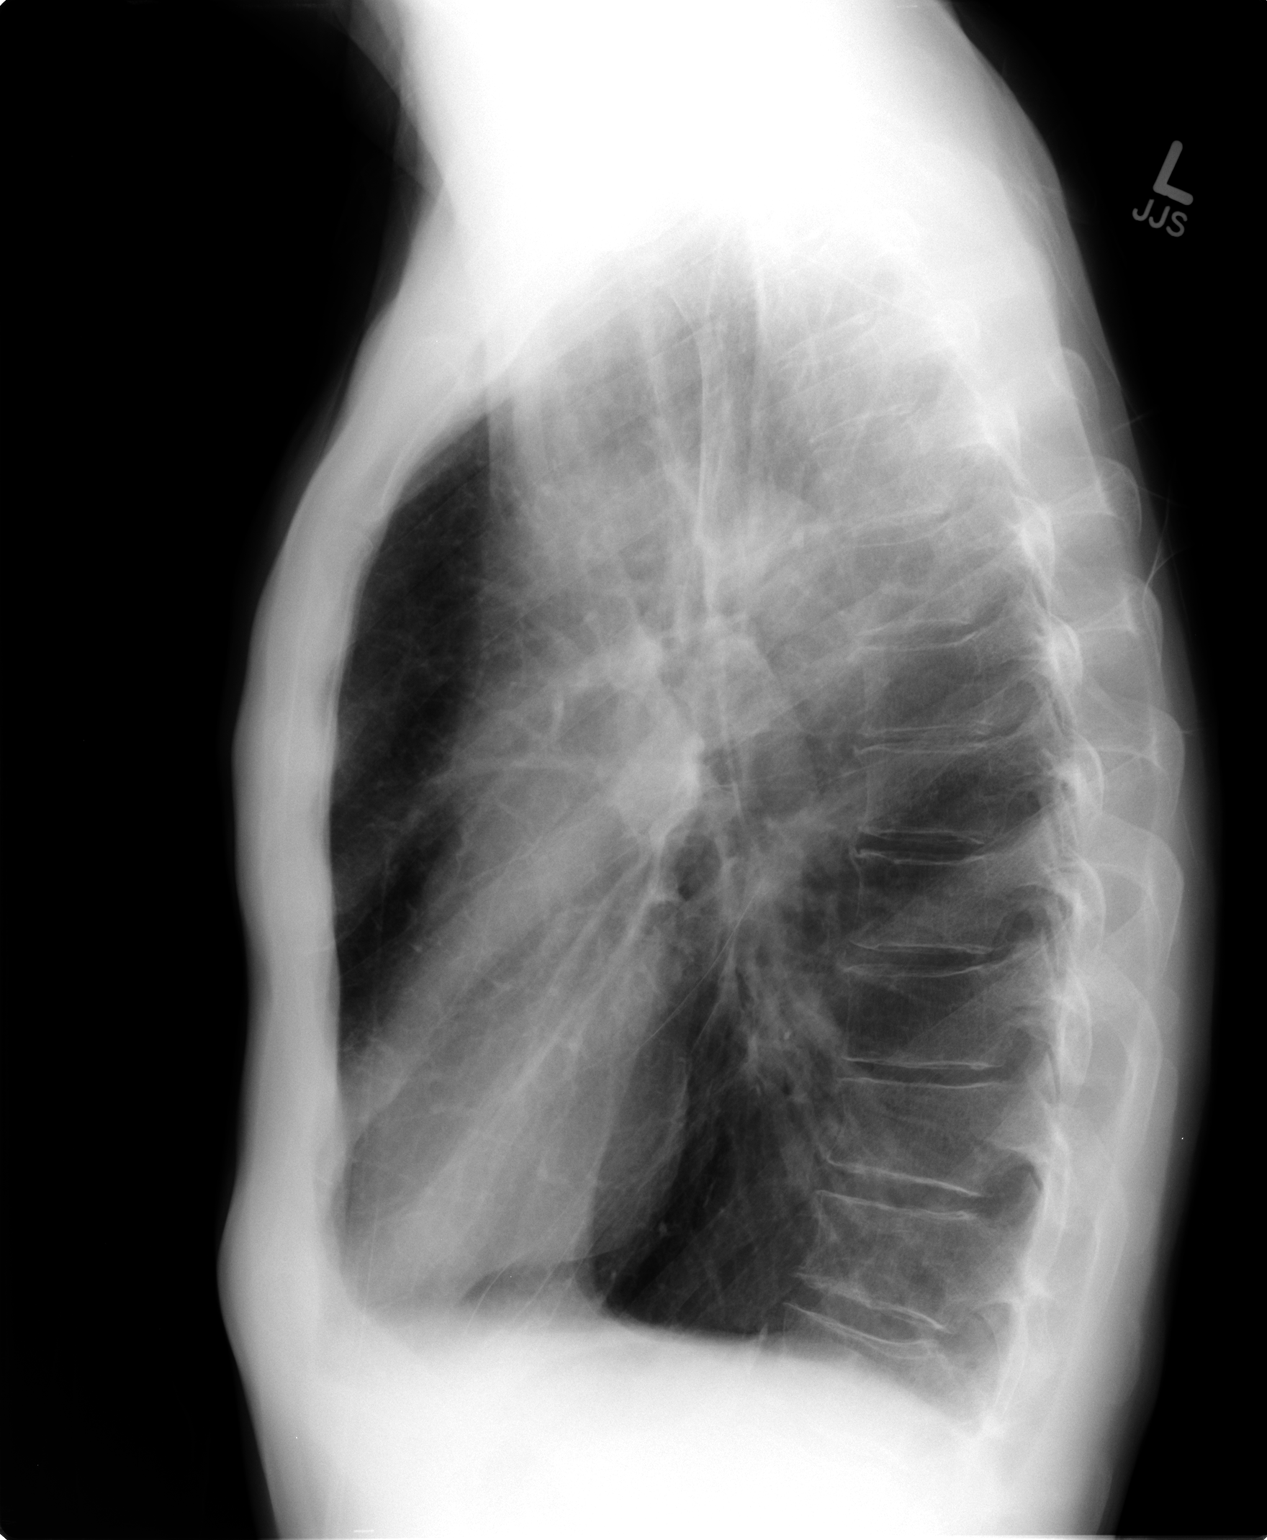

[2 of 2 positions shown; findings below may reference images not displayed]

FINDINGS: There is hyperinflation of the lungs compatible with
COPD.  Heart and mediastinal contours are within normal limits.  No
focal opacities or effusions.  No acute bony abnormality.
IMPRESSION: COPD.  No active disease.

## 2013-12-21 ENCOUNTER — Other Ambulatory Visit: Payer: Self-pay | Admitting: Family Medicine

## 2013-12-21 ENCOUNTER — Encounter: Payer: Self-pay | Admitting: Family Medicine

## 2013-12-21 ENCOUNTER — Ambulatory Visit (INDEPENDENT_AMBULATORY_CARE_PROVIDER_SITE_OTHER): Payer: Medicare Other | Admitting: Family Medicine

## 2013-12-21 VITALS — BP 122/50 | HR 85 | Temp 97.3°F | Ht 69.0 in | Wt 102.0 lb

## 2013-12-21 DIAGNOSIS — F419 Anxiety disorder, unspecified: Secondary | ICD-10-CM

## 2013-12-21 DIAGNOSIS — J438 Other emphysema: Secondary | ICD-10-CM

## 2013-12-21 DIAGNOSIS — F411 Generalized anxiety disorder: Secondary | ICD-10-CM

## 2013-12-21 DIAGNOSIS — J439 Emphysema, unspecified: Secondary | ICD-10-CM

## 2013-12-21 MED ORDER — SERTRALINE HCL 25 MG PO TABS: 25.0000 mg | ORAL_TABLET | Freq: Every day | ORAL | Status: DC

## 2013-12-21 MED ORDER — IPRATROPIUM-ALBUTEROL 0.5-2.5 (3) MG/3ML IN SOLN: 3.0000 mL | Freq: Four times a day (QID) | RESPIRATORY_TRACT | Status: DC

## 2013-12-21 MED ORDER — ASPIRIN 81 MG PO TABS: 81.0000 mg | ORAL_TABLET | Freq: Every day | ORAL | Status: AC

## 2013-12-21 MED ORDER — ALBUTEROL SULFATE HFA 108 (90 BASE) MCG/ACT IN AERS: INHALATION_SPRAY | RESPIRATORY_TRACT | Status: DC

## 2013-12-21 MED ORDER — BUSPIRONE HCL 10 MG PO TABS: 10.0000 mg | ORAL_TABLET | Freq: Two times a day (BID) | ORAL | Status: DC

## 2013-12-21 MED ORDER — GUAIFENESIN ER 600 MG PO TB12: 600.0000 mg | ORAL_TABLET | Freq: Two times a day (BID) | ORAL | Status: AC | PRN

## 2013-12-21 MED ORDER — BUDESONIDE 0.5 MG/2ML IN SUSP: 0.5000 mg | Freq: Two times a day (BID) | RESPIRATORY_TRACT | Status: DC

## 2013-12-21 NOTE — Patient Instructions (Addendum)
Hector House, it was a pleasure seeing you today. Today we talked about your breathing. It seems things are going great for you. I'm happy for you. I will refill your medications today. I will discontinue your Xanax and continue your Buspar for your anxiety  Please make an appointment to see me in 6 months for a follow-up, or sooner if needed.  If you have any questions or concerns, please do not hesitate to call the office at [phone].  Sincerely,  [name], MD

## 2013-12-25 ENCOUNTER — Telehealth: Payer: Self-pay | Admitting: Pulmonary Disease

## 2013-12-25 DIAGNOSIS — J439 Emphysema, unspecified: Secondary | ICD-10-CM

## 2013-12-25 NOTE — Telephone Encounter (Signed)
Spoke with the pt and notified that we have ordered new neb machine  He verbalized understanding and nothing further needed

## 2013-12-25 NOTE — Telephone Encounter (Signed)
Spoke with Izora Gala  She states that the correct DME is [facility]  I have sent order to [facility]  Nothing further needed

## 2013-12-28 ENCOUNTER — Encounter: Payer: Self-pay | Admitting: Family Medicine

## 2013-12-28 NOTE — Progress Notes (Signed)
   Subjective:    Patient ID: Hector House, male    DOB: Jan 31, 1946, 68 y.o.   MRN: [id number]  HPI  Patient presents to clinic today for follow-up of COPD and anxiety. COPD currently controlled with Pulmicort, Duoneb and Proair and is managed by Dr. Gwenette Greet, Pulmonology. Patient's symptoms have improved greatly since being started on home oxygen. Anxiety currently controlled on Buspar and Xanax.Overall, symptoms improved while on medication. No side effects.   Review of Systems  Respiratory: Positive for cough and shortness of breath. Negative for wheezing.   Cardiovascular: Negative for chest pain.  Neurological: Negative for headaches.  Psychiatric/Behavioral: The patient is not nervous/anxious.        Objective:   Physical Exam  Constitutional: He is oriented to person, place, and time. He appears well-developed and well-nourished.  Cardiovascular: Normal rate and regular rhythm.   Pulmonary/Chest: Effort normal. No respiratory distress. He has no decreased breath sounds. He has no wheezes.  Neurological: He is alert and oriented to person, place, and time.  Skin: Skin is warm and dry.       Assessment & Plan:

## 2013-12-28 NOTE — Assessment & Plan Note (Signed)
Patient now on home oxygen. Appears stable. No changes to medications. Will order refills.

## 2013-12-28 NOTE — Assessment & Plan Note (Signed)
Better controlled. Will discontinue Xanax and continue Buspar.

## 2014-02-13 ENCOUNTER — Other Ambulatory Visit: Payer: Self-pay | Admitting: Family Medicine

## 2014-02-27 ENCOUNTER — Ambulatory Visit: Payer: Medicare Other | Admitting: Pulmonary Disease

## 2014-03-06 ENCOUNTER — Encounter (INDEPENDENT_AMBULATORY_CARE_PROVIDER_SITE_OTHER): Payer: Self-pay | Admitting: Surgery

## 2014-03-21 ENCOUNTER — Ambulatory Visit: Payer: Medicare Other | Admitting: Pulmonary Disease

## 2014-04-03 ENCOUNTER — Telehealth: Payer: Self-pay | Admitting: Pulmonary Disease

## 2014-04-03 NOTE — Telephone Encounter (Signed)
Called [facility] and they will be faxing an order to [facility] to get the nebulizer medications filled.  They were given the fax number up front and nothing further is needed.

## 2014-04-10 ENCOUNTER — Telehealth: Payer: Self-pay | Admitting: Pulmonary Disease

## 2014-04-10 NOTE — Telephone Encounter (Signed)
Med list has been faxed to the number given and nothing further is needed.

## 2014-04-11 ENCOUNTER — Other Ambulatory Visit: Payer: Self-pay | Admitting: Pulmonary Disease

## 2014-04-12 ENCOUNTER — Ambulatory Visit (INDEPENDENT_AMBULATORY_CARE_PROVIDER_SITE_OTHER): Payer: Medicare Other | Admitting: Pulmonary Disease

## 2014-04-12 ENCOUNTER — Encounter: Payer: Self-pay | Admitting: Pulmonary Disease

## 2014-04-12 ENCOUNTER — Other Ambulatory Visit: Payer: Self-pay | Admitting: *Deleted

## 2014-04-12 VITALS — BP 90/60 | HR 97 | Temp 98.0°F | Ht 70.0 in | Wt 100.2 lb

## 2014-04-12 DIAGNOSIS — J438 Other emphysema: Secondary | ICD-10-CM

## 2014-04-12 DIAGNOSIS — Z23 Encounter for immunization: Secondary | ICD-10-CM

## 2014-04-12 DIAGNOSIS — J439 Emphysema, unspecified: Secondary | ICD-10-CM

## 2014-04-12 MED ORDER — ALBUTEROL SULFATE HFA 108 (90 BASE) MCG/ACT IN AERS: INHALATION_SPRAY | RESPIRATORY_TRACT | Status: DC

## 2014-04-12 NOTE — Assessment & Plan Note (Signed)
The patient appears to be stable from his last visit, but really needs to work on total smoking cessation and also some type of conditioning program. He still has not gotten his portable oxygen 5 months later after ordering, and we will change him to a different home care company. I have offered to send him to pulmonary rehabilitation again, but he does not feel he is able to do it. I have asked him to continue on his current bronchodilator regimen, and to stay on his oxygen at night. Hopefully, he can become more active once he gets his portable concentrator. Will also give him the flu shot today.

## 2014-04-12 NOTE — Patient Instructions (Signed)
Continue on current breathing medications. Keep working on total smoking cessation. Think about going to pulmonary rehab at the hospital.  I think it would really help you. Will get you a portable oxygen concentrator, so you can be more active. Will give you the flu shot today. followup with me again in 17mos.

## 2014-04-12 NOTE — Progress Notes (Signed)
   Subjective:    Patient ID: Hector House, male    DOB: 1946/05/21, 68 y.o.   MRN: [id number]  HPI The patient comes in today for followup of his known severe COPD with chronic respiratory failure. He is staying on his nebulized bronchodilators, but unfortunately continues to smoke a few cigarettes a day. I ordered a portable oxygen concentrator for him 5 months ago, and he has still not gotten the device.  He feels that his breathing is stable from the last visit, and has not a recent chest infection or acute exacerbation. He is wearing his oxygen at night while sleeping.   Review of Systems  Constitutional: Negative for fever and unexpected weight change.  HENT: Negative for congestion, dental problem, ear pain, nosebleeds, postnasal drip, rhinorrhea, sinus pressure, sneezing, sore throat and trouble swallowing.   Eyes: Negative for redness and itching.  Respiratory: Positive for cough and shortness of breath. Negative for chest tightness and wheezing.   Cardiovascular: Negative for palpitations and leg swelling.  Gastrointestinal: Negative for nausea and vomiting.  Genitourinary: Negative for dysuria.  Musculoskeletal: Negative for joint swelling.  Skin: Negative for rash.  Neurological: Negative for headaches.  Hematological: Does not bruise/bleed easily.  Psychiatric/Behavioral: Negative for dysphoric mood. The patient is not nervous/anxious.        Objective:   Physical Exam Thin male in no acute distress Nose without purulence or discharge noted Neck without lymphadenopathy or thyromegaly Chest with very diminished breath sounds, but no active wheezing. Cardiac exam with regular rate and rhythm and distant heart sounds Lower extremities with no edema, no cyanosis Alert and oriented, moves all 4 extremities.       Assessment & Plan:

## 2014-05-06 ENCOUNTER — Encounter: Payer: Self-pay | Admitting: Gastroenterology

## 2014-05-13 ENCOUNTER — Other Ambulatory Visit: Payer: Self-pay | Admitting: Family Medicine

## 2014-05-20 ENCOUNTER — Telehealth: Payer: Self-pay | Admitting: Pulmonary Disease

## 2014-05-21 NOTE — Telephone Encounter (Signed)
Pt requesting refill for Xanax.  Last refill: 11/28/13; LOV 04/12/14; next OV 07/12/14; ok to refill?

## 2014-05-21 NOTE — Telephone Encounter (Signed)
Ok to fill xanax 0.5mg  bid prn, #60 with no fillls.

## 2014-05-28 ENCOUNTER — Telehealth: Payer: Self-pay | Admitting: Pulmonary Disease

## 2014-05-28 NOTE — Telephone Encounter (Signed)
We received a request from [facility] to write an Rx for the pt budesonide twice daily and albuterol four times a day. Plain albuterol neb is not on the pt med list. He is listed as using duoneb four times a day and 2 additional as needed. Please advise if it is ok to write plain albuterol only or should the pt be on duoneb as listed? Arco Bing, CMA  No Known Allergies

## 2014-05-29 NOTE — Telephone Encounter (Signed)
Called and spoke with Maudie Mercury @ [facility] States that the patient is in the process of of transferring from [place].  [facility] is still working to get all information from the patient about meds and services.  Will contact our office back once she speaks with the patient regarding this switch. Will await call back from [facility].

## 2014-05-29 NOTE — Telephone Encounter (Signed)
He is not to have plain albuterol in his nebulizer.  He is to take duo neb 4 times a day scheduled, and can take 2 additional treatments as needed if having a bad day.

## 2014-05-31 NOTE — Telephone Encounter (Signed)
Called and spoke with Manuela Schwartz  She states that Maudie Mercury is off today  She states that she will have Iris check on this and call us back today

## 2014-06-07 MED ORDER — IPRATROPIUM-ALBUTEROL 0.5-2.5 (3) MG/3ML IN SOLN: 3.0000 mL | Freq: Four times a day (QID) | RESPIRATORY_TRACT | Status: DC

## 2014-06-07 NOTE — Telephone Encounter (Signed)
VS signed the CMN for duoneb and I have just faxed it to [place] for the pt

## 2014-06-07 NOTE — Telephone Encounter (Signed)
Spoke with the pt  He states that he is now completely out of Duoneb  I called [facility] and spoke with Maudie Mercury  I gave VO for Duoneb and she will fax over CMN Will have another MD sign for this since [facility] is out of the office  Will await fax

## 2014-06-07 NOTE — Telephone Encounter (Signed)
Patient is needing nebulizer medications.  [place]

## 2014-06-14 ENCOUNTER — Other Ambulatory Visit: Payer: Self-pay | Admitting: Family Medicine

## 2014-06-24 ENCOUNTER — Encounter (HOSPITAL_COMMUNITY): Payer: Self-pay | Admitting: Emergency Medicine

## 2014-06-24 ENCOUNTER — Emergency Department (HOSPITAL_COMMUNITY): Payer: Medicare Other

## 2014-06-24 ENCOUNTER — Inpatient Hospital Stay (HOSPITAL_COMMUNITY)
Admission: EM | Admit: 2014-06-24 | Discharge: 2014-06-27 | DRG: 190 | Disposition: A | Discharge status: Skilled Nursing Facility | Payer: Medicare Other | Attending: Family Medicine | Admitting: Family Medicine

## 2014-06-24 DIAGNOSIS — E440 Moderate protein-calorie malnutrition: Secondary | ICD-10-CM | POA: Diagnosis present

## 2014-06-24 DIAGNOSIS — Z9981 Dependence on supplemental oxygen: Secondary | ICD-10-CM

## 2014-06-24 DIAGNOSIS — Z79899 Other long term (current) drug therapy: Secondary | ICD-10-CM

## 2014-06-24 DIAGNOSIS — Z923 Personal history of irradiation: Secondary | ICD-10-CM | POA: Diagnosis not present

## 2014-06-24 DIAGNOSIS — Z7982 Long term (current) use of aspirin: Secondary | ICD-10-CM

## 2014-06-24 DIAGNOSIS — I10 Essential (primary) hypertension: Secondary | ICD-10-CM | POA: Diagnosis present

## 2014-06-24 DIAGNOSIS — M545 Low back pain, unspecified: Secondary | ICD-10-CM | POA: Diagnosis present

## 2014-06-24 DIAGNOSIS — F419 Anxiety disorder, unspecified: Secondary | ICD-10-CM | POA: Diagnosis present

## 2014-06-24 DIAGNOSIS — R339 Retention of urine, unspecified: Secondary | ICD-10-CM | POA: Diagnosis present

## 2014-06-24 DIAGNOSIS — F1010 Alcohol abuse, uncomplicated: Secondary | ICD-10-CM | POA: Diagnosis present

## 2014-06-24 DIAGNOSIS — F329 Major depressive disorder, single episode, unspecified: Secondary | ICD-10-CM | POA: Diagnosis present

## 2014-06-24 DIAGNOSIS — E43 Unspecified severe protein-calorie malnutrition: Secondary | ICD-10-CM | POA: Diagnosis present

## 2014-06-24 DIAGNOSIS — F17210 Nicotine dependence, cigarettes, uncomplicated: Secondary | ICD-10-CM | POA: Diagnosis present

## 2014-06-24 DIAGNOSIS — J449 Chronic obstructive pulmonary disease, unspecified: Secondary | ICD-10-CM

## 2014-06-24 DIAGNOSIS — Z85828 Personal history of other malignant neoplasm of skin: Secondary | ICD-10-CM

## 2014-06-24 DIAGNOSIS — M1990 Unspecified osteoarthritis, unspecified site: Secondary | ICD-10-CM | POA: Diagnosis present

## 2014-06-24 DIAGNOSIS — L219 Seborrheic dermatitis, unspecified: Secondary | ICD-10-CM | POA: Diagnosis present

## 2014-06-24 DIAGNOSIS — E876 Hypokalemia: Secondary | ICD-10-CM | POA: Diagnosis present

## 2014-06-24 DIAGNOSIS — F064 Anxiety disorder due to known physiological condition: Secondary | ICD-10-CM | POA: Diagnosis present

## 2014-06-24 DIAGNOSIS — R0602 Shortness of breath: Secondary | ICD-10-CM | POA: Diagnosis present

## 2014-06-24 DIAGNOSIS — F1099 Alcohol use, unspecified with unspecified alcohol-induced disorder: Secondary | ICD-10-CM

## 2014-06-24 DIAGNOSIS — E871 Hypo-osmolality and hyponatremia: Secondary | ICD-10-CM | POA: Diagnosis present

## 2014-06-24 DIAGNOSIS — C4921 Malignant neoplasm of connective and soft tissue of right lower limb, including hip: Secondary | ICD-10-CM | POA: Diagnosis present

## 2014-06-24 DIAGNOSIS — Z720 Tobacco use: Secondary | ICD-10-CM

## 2014-06-24 DIAGNOSIS — J438 Other emphysema: Secondary | ICD-10-CM

## 2014-06-24 DIAGNOSIS — J441 Chronic obstructive pulmonary disease with (acute) exacerbation: Principal | ICD-10-CM

## 2014-06-24 DIAGNOSIS — J439 Emphysema, unspecified: Secondary | ICD-10-CM | POA: Diagnosis present

## 2014-06-24 DIAGNOSIS — Z681 Body mass index (BMI) 19 or less, adult: Secondary | ICD-10-CM

## 2014-06-24 DIAGNOSIS — Z789 Other specified health status: Secondary | ICD-10-CM | POA: Diagnosis present

## 2014-06-24 DIAGNOSIS — Z7289 Other problems related to lifestyle: Secondary | ICD-10-CM | POA: Diagnosis present

## 2014-06-24 DIAGNOSIS — F17200 Nicotine dependence, unspecified, uncomplicated: Secondary | ICD-10-CM | POA: Diagnosis present

## 2014-06-24 DIAGNOSIS — R636 Underweight: Secondary | ICD-10-CM | POA: Diagnosis present

## 2014-06-24 HISTORY — DX: Chronic obstructive pulmonary disease, unspecified: J44.9

## 2014-06-24 LAB — CBC WITH DIFFERENTIAL/PLATELET
BASOS PCT: 0 % (ref 0–1)
Basophils Absolute: 0 10*3/uL (ref 0.0–0.1)
EOS ABS: 0 10*3/uL (ref 0.0–0.7)
Eosinophils Relative: 0 % (ref 0–5)
HEMATOCRIT: 38.3 % — AB (ref 39.0–52.0)
Hemoglobin: 13.4 g/dL (ref 13.0–17.0)
Lymphocytes Relative: 24 % (ref 12–46)
Lymphs Abs: 1.8 10*3/uL (ref 0.7–4.0)
MCH: 32 pg (ref 26.0–34.0)
MCHC: 35 g/dL (ref 30.0–36.0)
MCV: 91.4 fL (ref 78.0–100.0)
MONO ABS: 0.5 10*3/uL (ref 0.1–1.0)
Monocytes Relative: 7 % (ref 3–12)
Neutro Abs: 5 10*3/uL (ref 1.7–7.7)
Neutrophils Relative %: 69 % (ref 43–77)
Platelets: 213 10*3/uL (ref 150–400)
RBC: 4.19 MIL/uL — ABNORMAL LOW (ref 4.22–5.81)
RDW: 12.8 % (ref 11.5–15.5)
WBC: 7.4 10*3/uL (ref 4.0–10.5)

## 2014-06-24 LAB — BASIC METABOLIC PANEL
Anion gap: 12 (ref 5–15)
BUN: 19 mg/dL (ref 6–23)
CALCIUM: 8.9 mg/dL (ref 8.4–10.5)
CO2: 29 mEq/L (ref 19–32)
Chloride: 87 mEq/L — ABNORMAL LOW (ref 96–112)
Creatinine, Ser: 0.66 mg/dL (ref 0.50–1.35)
GFR calc Af Amer: >90 mL/min (ref >90)
GFR calc non Af Amer: >90 mL/min (ref >90)
GLUCOSE: 156 mg/dL — AB (ref 70–99)
POTASSIUM: 3.6 meq/L — AB (ref 3.7–5.3)
Sodium: 128 mEq/L — ABNORMAL LOW (ref 137–147)

## 2014-06-24 LAB — COMPREHENSIVE METABOLIC PANEL
ALT: 20 U/L (ref 0–53)
AST: 23 U/L (ref 0–37)
Albumin: 4.1 g/dL (ref 3.5–5.2)
Alkaline Phosphatase: 66 U/L (ref 39–117)
Anion gap: 16 — ABNORMAL HIGH (ref 5–15)
BILIRUBIN TOTAL: 0.9 mg/dL (ref 0.3–1.2)
BUN: 13 mg/dL (ref 6–23)
CHLORIDE: 82 meq/L — AB (ref 96–112)
CO2: 32 mEq/L (ref 19–32)
CREATININE: 0.7 mg/dL (ref 0.50–1.35)
Calcium: 9.4 mg/dL (ref 8.4–10.5)
GFR calc Af Amer: >90 mL/min (ref >90)
Glucose, Bld: 105 mg/dL — ABNORMAL HIGH (ref 70–99)
Potassium: 3.2 mEq/L — ABNORMAL LOW (ref 3.7–5.3)
Sodium: 130 mEq/L — ABNORMAL LOW (ref 137–147)
Total Protein: 6.9 g/dL (ref 6.0–8.3)

## 2014-06-24 LAB — I-STAT CHEM 8, ED
BUN: 9 mg/dL (ref 6–23)
CALCIUM ION: 1.03 mmol/L — AB (ref 1.13–1.30)
Chloride: 81 mEq/L — ABNORMAL LOW (ref 96–112)
Creatinine, Ser: 0.8 mg/dL (ref 0.50–1.35)
Glucose, Bld: 110 mg/dL — ABNORMAL HIGH (ref 70–99)
HEMATOCRIT: 43 % (ref 39.0–52.0)
HEMOGLOBIN: 14.6 g/dL (ref 13.0–17.0)
Potassium: 2.7 mEq/L — CL (ref 3.7–5.3)
SODIUM: 128 meq/L — AB (ref 137–147)
TCO2: 32 mmol/L (ref 0–100)

## 2014-06-24 LAB — CBC
HCT: 38.5 % — ABNORMAL LOW (ref 39.0–52.0)
Hemoglobin: 13.5 g/dL (ref 13.0–17.0)
MCH: 32.1 pg (ref 26.0–34.0)
MCHC: 35.1 g/dL (ref 30.0–36.0)
MCV: 91.4 fL (ref 78.0–100.0)
PLATELETS: 198 10*3/uL (ref 150–400)
RBC: 4.21 MIL/uL — ABNORMAL LOW (ref 4.22–5.81)
RDW: 12.7 % (ref 11.5–15.5)
WBC: 5.7 10*3/uL (ref 4.0–10.5)

## 2014-06-24 LAB — I-STAT TROPONIN, ED: TROPONIN I, POC: 0.02 ng/mL (ref 0.00–0.08)

## 2014-06-24 LAB — TROPONIN I
Troponin I: <0.3 ng/mL (ref <0.30)
Troponin I: <0.3 ng/mL (ref <0.30)

## 2014-06-24 LAB — RPR

## 2014-06-24 LAB — HIV ANTIBODY (ROUTINE TESTING W REFLEX): HIV: NONREACTIVE

## 2014-06-24 LAB — MRSA PCR SCREENING: MRSA by PCR: NEGATIVE

## 2014-06-24 LAB — PRO B NATRIURETIC PEPTIDE: PRO B NATRI PEPTIDE: 244.4 pg/mL — AB (ref 0–125)

## 2014-06-24 MED ORDER — POTASSIUM CHLORIDE CRYS ER 20 MEQ PO TBCR
40.0000 meq | EXTENDED_RELEASE_TABLET | Freq: Once | ORAL | Status: AC
  Administered 2014-06-24: 40 meq via ORAL
  Filled 2014-06-24: qty 2

## 2014-06-24 MED ORDER — ACETAMINOPHEN 650 MG RE SUPP: 650.0000 mg | Freq: Four times a day (QID) | RECTAL | Status: DC | PRN

## 2014-06-24 MED ORDER — ALBUTEROL SULFATE HFA 108 (90 BASE) MCG/ACT IN AERS: 2.0000 | INHALATION_SPRAY | RESPIRATORY_TRACT | Status: DC | PRN

## 2014-06-24 MED ORDER — VITAMIN B-1 100 MG PO TABS
100.0000 mg | ORAL_TABLET | Freq: Every day | ORAL | Status: DC
  Administered 2014-06-24 – 2014-06-27 (×4): 100 mg via ORAL
  Filled 2014-06-24 (×4): qty 1

## 2014-06-24 MED ORDER — ALBUTEROL SULFATE (2.5 MG/3ML) 0.083% IN NEBU: 2.5000 mg | INHALATION_SOLUTION | RESPIRATORY_TRACT | Status: DC | PRN

## 2014-06-24 MED ORDER — ASPIRIN EC 81 MG PO TBEC
81.0000 mg | DELAYED_RELEASE_TABLET | Freq: Every day | ORAL | Status: DC
  Administered 2014-06-24 – 2014-06-27 (×4): 81 mg via ORAL
  Filled 2014-06-24 (×4): qty 1

## 2014-06-24 MED ORDER — HEPARIN SODIUM (PORCINE) 5000 UNIT/ML IJ SOLN
5000.0000 [IU] | Freq: Three times a day (TID) | INTRAMUSCULAR | Status: DC
  Administered 2014-06-24 – 2014-06-27 (×10): 5000 [IU] via SUBCUTANEOUS
  Filled 2014-06-24 (×12): qty 1

## 2014-06-24 MED ORDER — ALPRAZOLAM 0.5 MG PO TABS: 0.5000 mg | ORAL_TABLET | Freq: Two times a day (BID) | ORAL | Status: DC | PRN

## 2014-06-24 MED ORDER — IPRATROPIUM-ALBUTEROL 0.5-2.5 (3) MG/3ML IN SOLN: 3.0000 mL | Freq: Four times a day (QID) | RESPIRATORY_TRACT | Status: DC | PRN

## 2014-06-24 MED ORDER — SODIUM CHLORIDE 0.9 % IV SOLN
INTRAVENOUS | Status: DC
  Administered 2014-06-24 – 2014-06-27 (×6): via INTRAVENOUS
  Filled 2014-06-24: qty 1000

## 2014-06-24 MED ORDER — THIAMINE HCL 100 MG/ML IJ SOLN
100.0000 mg | Freq: Every day | INTRAMUSCULAR | Status: DC
  Filled 2014-06-24 (×2): qty 1

## 2014-06-24 MED ORDER — IPRATROPIUM-ALBUTEROL 0.5-2.5 (3) MG/3ML IN SOLN
3.0000 mL | Freq: Four times a day (QID) | RESPIRATORY_TRACT | Status: DC
Start: 2014-06-24 — End: 2014-06-27
  Administered 2014-06-24 – 2014-06-27 (×12): 3 mL via RESPIRATORY_TRACT
  Filled 2014-06-24 (×14): qty 3

## 2014-06-24 MED ORDER — BUSPIRONE HCL 10 MG PO TABS
10.0000 mg | ORAL_TABLET | Freq: Two times a day (BID) | ORAL | Status: DC
  Administered 2014-06-24 – 2014-06-27 (×7): 10 mg via ORAL
  Filled 2014-06-24 (×8): qty 1

## 2014-06-24 MED ORDER — ENSURE COMPLETE PO LIQD
237.0000 mL | Freq: Three times a day (TID) | ORAL | Status: DC
  Administered 2014-06-24 – 2014-06-27 (×7): 237 mL via ORAL

## 2014-06-24 MED ORDER — SODIUM CHLORIDE 0.9 % IJ SOLN
3.0000 mL | Freq: Two times a day (BID) | INTRAMUSCULAR | Status: DC
  Administered 2014-06-25 – 2014-06-26 (×4): 3 mL via INTRAVENOUS

## 2014-06-24 MED ORDER — PREDNISONE 50 MG PO TABS
50.0000 mg | ORAL_TABLET | Freq: Every day | ORAL | Status: DC
  Administered 2014-06-24 – 2014-06-27 (×4): 50 mg via ORAL
  Filled 2014-06-24 (×5): qty 1

## 2014-06-24 MED ORDER — LORAZEPAM 0.5 MG PO TABS
0.5000 mg | ORAL_TABLET | Freq: Four times a day (QID) | ORAL | Status: AC | PRN
  Administered 2014-06-24 – 2014-06-26 (×5): 0.5 mg via ORAL
  Filled 2014-06-24 (×5): qty 1

## 2014-06-24 MED ORDER — BUDESONIDE 0.5 MG/2ML IN SUSP
0.5000 mg | Freq: Two times a day (BID) | RESPIRATORY_TRACT | Status: DC
  Administered 2014-06-24 – 2014-06-27 (×7): 0.5 mg via RESPIRATORY_TRACT
  Filled 2014-06-24 (×10): qty 2

## 2014-06-24 MED ORDER — NICOTINE 7 MG/24HR TD PT24
7.0000 mg | MEDICATED_PATCH | Freq: Every day | TRANSDERMAL | Status: DC
  Administered 2014-06-24 – 2014-06-27 (×4): 7 mg via TRANSDERMAL
  Filled 2014-06-24 (×4): qty 1

## 2014-06-24 MED ORDER — ALBUTEROL SULFATE (2.5 MG/3ML) 0.083% IN NEBU
2.5000 mg | INHALATION_SOLUTION | RESPIRATORY_TRACT | Status: DC
  Administered 2014-06-24: 2.5 mg via RESPIRATORY_TRACT
  Filled 2014-06-24: qty 3

## 2014-06-24 MED ORDER — FOLIC ACID 1 MG PO TABS
1.0000 mg | ORAL_TABLET | Freq: Every day | ORAL | Status: DC
  Administered 2014-06-24 – 2014-06-27 (×4): 1 mg via ORAL
  Filled 2014-06-24 (×4): qty 1

## 2014-06-24 MED ORDER — ALBUTEROL SULFATE (2.5 MG/3ML) 0.083% IN NEBU
10.0000 mg | INHALATION_SOLUTION | Freq: Once | RESPIRATORY_TRACT | Status: AC
  Administered 2014-06-24: 10 mg via RESPIRATORY_TRACT
  Filled 2014-06-24: qty 12

## 2014-06-24 MED ORDER — DOXYCYCLINE HYCLATE 100 MG PO TABS
100.0000 mg | ORAL_TABLET | Freq: Two times a day (BID) | ORAL | Status: DC
  Administered 2014-06-24 – 2014-06-27 (×7): 100 mg via ORAL
  Filled 2014-06-24 (×8): qty 1

## 2014-06-24 MED ORDER — CEFTRIAXONE SODIUM 1 G IJ SOLR
1.0000 g | Freq: Once | INTRAMUSCULAR | Status: AC
  Administered 2014-06-24: 1 g via INTRAVENOUS
  Filled 2014-06-24: qty 10

## 2014-06-24 MED ORDER — ALBUTEROL SULFATE (2.5 MG/3ML) 0.083% IN NEBU
2.5000 mg | INHALATION_SOLUTION | RESPIRATORY_TRACT | Status: DC | PRN
  Administered 2014-06-24 – 2014-06-25 (×3): 2.5 mg via RESPIRATORY_TRACT
  Filled 2014-06-24 (×3): qty 3

## 2014-06-24 MED ORDER — IPRATROPIUM BROMIDE 0.02 % IN SOLN
2.0000 mg | Freq: Once | RESPIRATORY_TRACT | Status: AC
  Administered 2014-06-24: 2 mg via RESPIRATORY_TRACT
  Filled 2014-06-24: qty 10

## 2014-06-24 MED ORDER — BOOST / RESOURCE BREEZE PO LIQD
1.0000 | Freq: Three times a day (TID) | ORAL | Status: DC
  Administered 2014-06-24: 1 via ORAL

## 2014-06-24 MED ORDER — BENZONATATE 100 MG PO CAPS
100.0000 mg | ORAL_CAPSULE | Freq: Two times a day (BID) | ORAL | Status: AC
  Administered 2014-06-24 (×2): 100 mg via ORAL
  Filled 2014-06-24 (×3): qty 1

## 2014-06-24 MED ORDER — ACETAMINOPHEN 325 MG PO TABS
650.0000 mg | ORAL_TABLET | Freq: Four times a day (QID) | ORAL | Status: DC | PRN
  Administered 2014-06-26: 650 mg via ORAL
  Filled 2014-06-24: qty 2

## 2014-06-24 MED ORDER — SERTRALINE HCL 25 MG PO TABS
25.0000 mg | ORAL_TABLET | Freq: Every day | ORAL | Status: DC
  Administered 2014-06-24 – 2014-06-27 (×4): 25 mg via ORAL
  Filled 2014-06-24 (×4): qty 1

## 2014-06-24 MED ORDER — LORAZEPAM 2 MG/ML IJ SOLN
0.5000 mg | Freq: Four times a day (QID) | INTRAMUSCULAR | Status: AC | PRN
  Filled 2014-06-24: qty 1

## 2014-06-24 MED ORDER — POTASSIUM CHLORIDE CRYS ER 20 MEQ PO TBCR
20.0000 meq | EXTENDED_RELEASE_TABLET | Freq: Every day | ORAL | Status: DC
  Administered 2014-06-24 – 2014-06-27 (×4): 20 meq via ORAL
  Filled 2014-06-24 (×4): qty 1

## 2014-06-24 MED ORDER — ADULT MULTIVITAMIN W/MINERALS CH
1.0000 | ORAL_TABLET | Freq: Every day | ORAL | Status: DC
  Administered 2014-06-24 – 2014-06-27 (×4): 1 via ORAL
  Filled 2014-06-24 (×4): qty 1

## 2014-06-24 NOTE — ED Provider Notes (Signed)
CSN: [id number]     Arrival date & time 06/24/14  0014 History   First MD Initiated Contact with Patient 06/24/14 0023     Chief Complaint  Patient presents with  . Shortness of Breath     (Consider location/radiation/quality/duration/timing/severity/associated sxs/prior Treatment) Patient is a 69 y.o. male presenting with shortness of breath. The history is provided by the EMS personnel. The history is limited by the absence of a caregiver.  Shortness of Breath Severity:  Severe Onset quality:  Gradual Timing:  Constant Progression:  Worsening Chronicity:  New Context: not activity   Relieved by:  Nothing Worsened by:  Nothing tried Ineffective treatments:  Oxygen Associated symptoms: wheezing   Associated symptoms: no abdominal pain and no fever     Past Medical History  Diagnosis Date  . Hypertension   . Cancer     skin  . Arthritis   . Low back pain syndrome     Joint Pain  . Emphysema   . S/P radiation therapy 07/23/10 - 09/14/10    Right Thigh for Leiomyosarcoma / 61.2 Gy / 34 Fractions  . Leiomyosarcoma of thigh 2011  . Respiratory failure, acute 3/276/15 -11/04/13     Hospitalized  . Depression   . COPD (chronic obstructive pulmonary disease)    Past Surgical History  Procedure Laterality Date  . Laminectomy  2008    Three back surgeries per patient medical history form dated 05/06/10.  Marland Kitchen Elbow surgery  1967    L  . Leg skin lesion  biopsy / excision  04/20/10 / re-excision 06/18/10     Right thigh then re-excision of Lymph node right \\grion  06/18/10  . Back surgery    . Elbow surgery Left    Family History  Problem Relation Age of Onset  . Heart disease Mother     heart attack   History  Substance Use Topics  . Smoking status: Current Some Day Smoker -- 2.00 packs/day for 50 years    Types: Cigarettes    Last Attempt to Quit: 10/10/2013  . Smokeless tobacco: Never Used     Comment: pt states he is smoking 2/3 pack in 7 days.  . Alcohol Use: 1.8  oz/week    3 Cans of beer per week     Comment: Consumption per week.    Review of Systems  Unable to perform ROS Constitutional: Negative for fever.  Respiratory: Positive for shortness of breath and wheezing.   Gastrointestinal: Negative for abdominal pain.      Allergies  Review of patient's allergies indicates no known allergies.  Home Medications   Prior to Admission medications   Medication Sig Start Date End Date Taking? Authorizing Provider  albuterol (PROAIR HFA) 108 (90 BASE) MCG/ACT inhaler INHALE 2 PUFFS BY MOUTH EVERY 4 TO 6 HOURS AS NEEDED 04/12/14   Kathee Delton, MD  ALPRAZolam Duanne Moron) 0.5 MG tablet Take 1 tablet twice daily as needed 05/21/14   Kathee Delton, MD  aspirin 81 MG tablet Take 1 tablet (81 mg total) by mouth daily. 12/21/13   [name], MD  budesonide (PULMICORT) 0.5 MG/2ML nebulizer solution Take 2 mLs (0.5 mg total) by nebulization 2 (two) times daily. 12/21/13   [name], MD  busPIRone (BUSPAR) 10 MG tablet Take 1 tablet (10 mg total) by mouth 2 (two) times daily. 12/21/13   [name], MD  guaiFENesin (MUCINEX) 600 MG 12 hr tablet Take 1 tablet (600 mg total) by mouth 2 (two) times daily  as needed. 12/21/13   [name], MD  ipratropium-albuterol (DUONEB) 0.5-2.5 (3) MG/3ML SOLN Take 3 mLs by nebulization 4 (four) times daily. Can use an additional 2 treatments if needed. 06/07/14   Kathee Delton, MD  lisinopril-hydrochlorothiazide (PRINZIDE,ZESTORETIC) 20-25 MG per tablet TAKE 1 TABLET BY MOUTH DAILY 06/14/14   [name], MD  Multiple Vitamins-Minerals (CENTRUM SILVER PO) Take 1 tablet by mouth daily.     Historical Provider, MD  PROAIR HFA 108 (90 BASE) MCG/ACT inhaler INHALE 2 PUFFS BY MOUTH EVERY 4 TO 6 HOURS AS NEEDED 04/12/14   Kathee Delton, MD  sertraline (ZOLOFT) 25 MG tablet Take 1 tablet (25 mg total) by mouth daily. 12/21/13   [name], MD   BP 185/165 mmHg  Pulse 95  Temp(Src) 97.8 F (36.6 C) (Oral)  Resp 22  SpO2  100% Physical Exam  Constitutional: He is oriented to person, place, and time. He appears well-developed and well-nourished.  HENT:  Head: Normocephalic and atraumatic.  Mouth/Throat: Oropharynx is clear and moist.  Eyes: Conjunctivae are normal. Pupils are equal, round, and reactive to light.  Neck: Normal range of motion. Neck supple.  Cardiovascular: Normal rate and regular rhythm.   Pulmonary/Chest: Tachypnea noted. He is in respiratory distress. He has decreased breath sounds. He has no wheezes. He has no rales.  Abdominal: Soft. Bowel sounds are normal. There is no tenderness. There is no rebound and no guarding.  Musculoskeletal: Normal range of motion.  Neurological: He is alert and oriented to person, place, and time.  Skin: Skin is warm and dry.  Psychiatric: He has a normal mood and affect.    ED Course  Procedures (including critical care time) Labs Review Labs Reviewed  I-STAT CHEM 8, ED - Abnormal; Notable for the following:    Sodium 128 (*)    Potassium 2.7 (*)    Chloride 81 (*)    Glucose, Bld 110 (*)    Calcium, Ion 1.03 (*)    All other components within normal limits  CBC WITH DIFFERENTIAL  Randolm Idol, ED    Imaging Review Dg Chest Port 1 View  06/24/2014   CLINICAL DATA:  Shortness of breath  EXAM: PORTABLE CHEST - 1 VIEW  COMPARISON:  11/25/2013  FINDINGS: The cardiac shadow is within normal limits. The lungs are hyperinflated consistent with COPD. No focal infiltrate or sizable effusion is noted. No bony abnormality is seen.  IMPRESSION: COPD without acute abnormality.   Electronically Signed   By: Inez Catalina M.D.   On: 06/24/2014 00:51     EKG Interpretation   Date/Time:  Monday June 24 2014 00:41:48 EST Ventricular Rate:  85 PR Interval:  172 QRS Duration: 104 QT Interval:  462 QTC Calculation: 549 R Axis:   -70 Text Interpretation:  Sinus rhythm Multiform ventricular premature  complexes Incomplete RBBB and LAFB Prolonged QT  interval Confirmed by  [facility]  MD, Hawk Mones ([id number]) on 06/24/2014 1:02:53 AM      MDM   Final diagnoses:  COPD (chronic obstructive pulmonary disease)    Medications  albuterol (PROVENTIL) (2.5 MG/3ML) 0.083% nebulizer solution 10 mg (10 mg Nebulization Given 06/24/14 0027)  ipratropium (ATROVENT) nebulizer solution 2 mg (2 mg Nebulization Given 06/24/14 0028)  potassium chloride SA (K-DUR,KLOR-CON) CR tablet 40 mEq (40 mEq Oral Given 06/24/14 0138)  cefTRIAXone (ROCEPHIN) 1 g in dextrose 5 % 50 mL IVPB (1 g Intravenous New Bag/Given 06/24/14 0122)       Shawnise Peterkin Alfonso Patten, MD  06/24/14 0227 

## 2014-06-24 NOTE — Progress Notes (Signed)
EKG NSR and MD now at bedside. Hector House

## 2014-06-24 NOTE — ED Notes (Signed)
Pt to ED via [facility] with c/o shortness of breath.  On arrival to ED pt was finishing a neb treatment.

## 2014-06-24 NOTE — Progress Notes (Addendum)
INITIAL NUTRITION ASSESSMENT  DOCUMENTATION CODES Per approved criteria  -Non-severe (moderate) malnutrition in the context of social or environmental circumstances -Underweight  Pt meets criteria for MODERATE MALNUTRITION in the context of SOCIAL/ENVIRONMENTAL CIRCUMSTANCES as evidenced by severe muscle wasting and estimated energy intake <75% of estimated energy needs for >/=3 months.  INTERVENTION: Provide Ensure Complete TID in between meals Encourage PO intake Provided and reviewed "Suggestions for Increasing Calories and Protein" from the Academy of Nutrition and Dietetics  NUTRITION DIAGNOSIS: Inadequate oral intake related to inability to buy and prepare food as evidenced by patient's report and 8% weight loss in past 6 months.   Goal: Pt to meet >/= 90% of their estimated nutrition needs   Monitor:  PO intake, weight trend, labs  Reason for Assessment: Consult for PCM  68 y.o. male  Admitting Dx: COPD exacerbation  ASSESSMENT: 68 y.o. male presenting with shortness of breath secondary to COPD exacerbation. PMH is significant for COPD, anxiety/depression, HTN, leiomyosarcoma of right thigh s/p radiation, alcohol abuse, tobacco use, and severe protein-calorie malnutrition.  Patient reports that he has been losing weight due to not eating much or well. He states that he gets too tired to prepare food or shop for food. He primarily snacks on potato chips and cake and drinks soda. He reports eating one balanced meal every 1-2 weeks. He reports that a lady recently moved in with him who has been helping him grocery shop/prepare food. He states he did not eat any breakfast this morning due to lower abdominal discomfort. Nurse tech about to perform bladder scan at time of visit. Per weight history, pt has lost 8% of his body weight in the past 6 months.  Provided and reviewed "Suggestions for Increasing Calories and Protein" from the Academy of Nutrition and Dietetics. Encouraged pt  to snack frequently throughout the day. Encouraged pt to drink milk, juice, and nutritional supplements instead of only soda.  Labs: low sodium, low potassium, low chloride  Nutrition Focused Physical Exam:  Subcutaneous Fat:  Orbital Region: mild wasting Upper Arm Region: moderate wasting Thoracic and Lumbar Region: NA  Muscle:  Temple Region: moderate wasting Clavicle Bone Region: severe wasting Clavicle and Acromion Bone Region: severe wasting Scapular Bone Region: NA Dorsal Hand: moderate wasting Patellar Region: NA Anterior Thigh Region: NA Posterior Calf Region: NA  Edema: none noted    Height: Ht Readings from Last 1 Encounters:  06/24/14 5\' 8"  (1.727 m)    Weight: Wt Readings from Last 1 Encounters:  06/24/14 94 lb 9.2 oz (42.9 kg)    Ideal Body Weight: 154 lbs  % Ideal Body Weight: 61%  Wt Readings from Last 10 Encounters:  06/24/14 94 lb 9.2 oz (42.9 kg)  04/12/14 100 lb 3.2 oz (45.45 kg)  12/21/13 102 lb (46.267 kg)  11/28/13 108 lb (48.988 kg)  11/23/13 107 lb 1.6 oz (48.58 kg)  11/09/13 109 lb 1.6 oz (49.487 kg)  11/06/13 109 lb (49.442 kg)  11/03/13 102 lb 1.2 oz (46.3 kg)  10/17/13 106 lb 12.8 oz (48.444 kg)  10/12/13 104 lb 11.2 oz (47.492 kg)    Usual Body Weight: unknown  % Usual Body Weight: NA  BMI:  Body mass index is 14.38 kg/(m^2). (underweight)  Estimated Nutritional Needs: Kcal: 1500-1700 Protein: 80-90 grams Fluid: 1.5-1.7 L/day  Skin: intact  Diet Order: Diet regular  EDUCATION NEEDS: -No education needs identified at this time   Intake/Output Summary (Last 24 hours) at 06/24/14 1108 Last data filed at 06/24/14  0957  Gross per 24 hour  Intake    120 ml  Output    175 ml  Net    -55 ml    Last BM: WDL  Labs:   Recent Labs Lab 06/24/14 0041 06/24/14 0527  NA 128* 130*  K 2.7* 3.2*  CL 81* 82*  CO2  --  32  BUN 9 13  CREATININE 0.80 0.70  CALCIUM  --  9.4  GLUCOSE 110* 105*    CBG (last 3)  No  results for input(s): GLUCAP in the last 72 hours.  Scheduled Meds: . albuterol  2.5 mg Nebulization Q4H  . aspirin EC  81 mg Oral Daily  . budesonide  0.5 mg Nebulization BID  . busPIRone  10 mg Oral BID  . doxycycline  100 mg Oral Q12H  . feeding supplement (RESOURCE BREEZE)  1 Container Oral TID BM  . folic acid  1 mg Oral Daily  . heparin  5,000 Units Subcutaneous 3 times per day  . multivitamin with minerals  1 tablet Oral Daily  . nicotine  7 mg Transdermal Daily  . potassium chloride  20 mEq Oral Daily  . predniSONE  50 mg Oral Q breakfast  . sertraline  25 mg Oral Daily  . sodium chloride  3 mL Intravenous Q12H  . thiamine  100 mg Oral Daily   Or  . thiamine  100 mg Intravenous Daily    Continuous Infusions: . sodium chloride 0.9 % 1,000 mL infusion 75 mL/hr at 06/24/14 0536    Past Medical History  Diagnosis Date  . Hypertension   . Cancer     skin  . Arthritis   . Low back pain syndrome     Joint Pain  . Emphysema   . S/P radiation therapy 07/23/10 - 09/14/10    Right Thigh for Leiomyosarcoma / 61.2 Gy / 34 Fractions  . Leiomyosarcoma of thigh 2011  . Respiratory failure, acute 3/276/15 -11/04/13     Hospitalized  . Depression   . COPD (chronic obstructive pulmonary disease)     Past Surgical History  Procedure Laterality Date  . Laminectomy  2008    Three back surgeries per patient medical history form dated 05/06/10.  Marland Kitchen Elbow surgery  1967    L  . Leg skin lesion  biopsy / excision  04/20/10 / re-excision 06/18/10     Right thigh then re-excision of Lymph node right \\grion  06/18/10  . Back surgery    . Elbow surgery Left     Pryor Ochoa RD, LDN Inpatient Clinical Dietitian Pager: [phone] After Hours Pager: [phone]

## 2014-06-24 NOTE — Evaluation (Addendum)
Occupational Therapy Evaluation Patient Details Name: Hector House MRN: [id number] DOB: 06-05-1946 Today's Date: 06/24/2014    History of Present Illness 68 year old male with history of COPD (followed by [facility], on nocturnal O2) presented 11/16 c/o SOB associated with productive cough, fever, and chills. Admitted by FMTS and treated for COPD exacerbation.   Clinical Impression   Pt admitted with above. Pt impulsive in session with decreased safety awareness. Recommending SNF for rehab prior to d/c home. Feel pt will benefit from acute OT to increase independence and safety with BADLs prior to d/c as well as increase activity tolerance.     Follow Up Recommendations  SNF;Supervision/Assistance - 24 hour    Equipment Recommendations  Other (comment) (defer to next venue)    Recommendations for Other Services       Precautions / Restrictions Precautions Precautions: Fall      Mobility Bed Mobility Overal bed mobility: Needs Assistance Bed Mobility: Supine to Sit     Supine to sit: Supervision        Transfers Overall transfer level: Needs assistance Transfers: Sit to/from Stand Sit to Stand: Min guard         General transfer comment: Pt impulsive         ADL Overall ADL's : Needs assistance/impaired                     Lower Body Dressing: Min guard;Sit to/from stand   Toilet Transfer: Min guard;Ambulation;Regular Toilet   Toileting- Water quality scientist and Hygiene: Supervision/safety (standing)       Functional mobility during ADLs: Min guard General ADL Comments: Educated on energy conservation techniques. Pt impulsive in session standing up quickly and trying to hurry to the bathroom getting tangled in cords. Educated on breathing technique and mentioned going to rehab.     Vision                     Perception     Praxis      Pertinent Vitals/Pain Pain Assessment: Faces Faces Pain Scale: Hurts little more Pain Location:  stomach Pain Intervention(s): Monitored during session;Relaxation     Hand Dominance Right   Extremity/Trunk Assessment Upper Extremity Assessment Upper Extremity Assessment: RUE deficits/detail;LUE deficits/detail RUE Deficits / Details: tremors LUE Deficits / Details: tremors   Lower Extremity Assessment Lower Extremity Assessment: Defer to PT evaluation       Communication     Cognition Arousal/Alertness: Awake/alert Behavior During Therapy: Anxious;Impulsive Overall Cognitive Status: No family/caregiver present to determine baseline cognitive functioning                     General Comments       Exercises       Shoulder Instructions      Home Living Family/patient expects to be discharged to:: Private residence Living Arrangements: Alone Available Help at Discharge: Friend(s) Type of Home: [place] Access: Stairs to enter Technical brewer of Steps: 2   Home Layout: One level (pt reports he has made homemade ramps throughout)     Bathroom Shower/Tub: Tub/shower unit         Home Equipment: Environmental consultant - 2 wheels;Cane - single point;Bedside commode;Shower seat;Transport chair          Prior Functioning/Environment Level of Independence:  (unsure of PLOF)             OT Diagnosis: Acute pain;Other (comment) (cardiopulmonary status limiting activity)   OT  Problem List: Decreased activity tolerance;Decreased strength;Impaired balance (sitting and/or standing);Decreased safety awareness;Decreased knowledge of use of DME or AE;Decreased knowledge of precautions;Pain;Cardiopulmonary status limiting activity   OT Treatment/Interventions: Self-care/ADL training;DME and/or AE instruction;Therapeutic activities;Patient/family education;Balance training;Energy conservation    OT Goals(Current goals can be found in the care plan section) Acute Rehab OT Goals Patient Stated Goal: not stated OT Goal Formulation: With  patient Time For Goal Achievement: 07/15/14 Potential to Achieve Goals: Good ADL Goals Pt Will Perform Grooming: standing;with set-up Pt Will Perform Upper Body Bathing: with set-up;sitting Pt Will Perform Lower Body Bathing: with set-up;sit to/from stand Pt Will Perform Upper Body Dressing: with set-up;sitting Pt Will Perform Lower Body Dressing: with set-up;sit to/from stand Pt Will Transfer to Toilet: with modified independence;ambulating Additional ADL Goal #1: Pt will independently verbalize and demonstrate 3/3 energy conservation techniques.  OT Frequency: Min 2X/week   Barriers to D/C:            Co-evaluation              End of Session    Activity Tolerance: Patient limited by fatigue Patient left: in bed;with call bell/phone within reach;with bed alarm set   Time: 1721-1736 OT Time Calculation (min): 15 min Charges:  OT General Charges $OT Visit: 1 Procedure OT Evaluation $Initial OT Evaluation Tier I: 1 Procedure G-CodesBenito Mccreedy OTR/L [id number] 06/24/2014, 6:05 PM

## 2014-06-24 NOTE — Evaluation (Signed)
Physical Therapy Evaluation Patient Details Name: Hector House MRN: [id number] DOB: [birth year] Today's Date: 06/24/2014   History of Present Illness  68 year old male with history of COPD (followed by [facility], on nocturnal O2) presented 11/16 c/o SOB associated with productive cough, fever, and chills. Admitted by FMTS and treated for COPD exacerbation.  Clinical Impression  Patient demonstrates deficits in functional mobility as indicated below. Will need continued skilled PT to address deficits and maximize function. Will see as indicated and progress as tolerated. Patient significantly unsteady and demonstrates poor mobility and safety awareness. Will need SNF upon acute discharge.    Follow Up Recommendations SNF;Supervision/Assistance - 24 hour    Equipment Recommendations   (TBD)    Recommendations for Other Services       Precautions / Restrictions Precautions Precautions: Fall      Mobility  Bed Mobility Overal bed mobility: Needs Assistance Bed Mobility: Supine to Sit     Supine to sit: Min guard        Transfers Overall transfer level: Needs assistance Equipment used: 1 person hand held assist Transfers: Sit to/from Stand Sit to Stand: Min assist         General transfer comment: Instability noted with standing  Ambulation/Gait Ambulation/Gait assistance: Mod assist Ambulation Distance (Feet): 40 Feet Assistive device: 1 person hand held assist Gait Pattern/deviations: Step-to pattern;Decreased stride length;Ataxic;Staggering left;Staggering right;Narrow base of support Gait velocity: decreased Gait velocity interpretation: Below normal speed for age/gender General Gait Details: Significant instability noted with ambulation, decreased activity tolerance.   Stairs            Wheelchair Mobility    Modified Rankin (Stroke Patients Only)       Balance Overall balance assessment: Needs assistance Sitting-balance support: Feet supported Sitting  balance-Leahy Scale: Fair     Standing balance support: During functional activity;No upper extremity supported Standing balance-Leahy Scale: Poor                               Pertinent Vitals/Pain Pain Assessment: No/denies pain    Home Living Family/patient expects to be discharged to:: Private residence Living Arrangements: Alone Available Help at Discharge: Friend(s) Type of Home: [place] Access: Stairs to enter   [employer] of Steps: 2 Home Layout: One level (patient reports he has made homemade ramps throughout) Home Equipment: Walker - 2 wheels;Cane - single point;Bedside commode;Shower seat;Transport chair      Prior Function Level of Independence: Independent               Hand Dominance   Dominant Hand: Right    Extremity/Trunk Assessment   Upper Extremity Assessment: Defer to OT evaluation           Lower Extremity Assessment: Generalized weakness (impaired coordination)         Communication      Cognition Arousal/Alertness: Awake/alert Behavior During Therapy: WFL for tasks assessed/performed Overall Cognitive Status: No family/caregiver present to determine baseline cognitive functioning                      General Comments      Exercises        Assessment/Plan    PT Assessment Patient needs continued PT services  PT Diagnosis Difficulty walking;Abnormality of gait;Generalized weakness   PT Problem List Decreased strength;Decreased activity tolerance;Decreased balance;Decreased mobility;Decreased coordination;Decreased cognition;Decreased safety awareness;Cardiopulmonary status limiting activity  PT Treatment Interventions  DME instruction;Gait training;Stair training;Functional mobility training;Therapeutic activities;Therapeutic exercise;Balance training;Patient/family education   PT Goals (Current goals can be found in the Care Plan section) Acute Rehab PT Goals Patient  Stated Goal: to get stronger PT Goal Formulation: With patient Time For Goal Achievement: 07/08/14 Potential to Achieve Goals: Fair    Frequency Min 2X/week   Barriers to discharge Decreased caregiver support      Co-evaluation               End of Session Equipment Utilized During Treatment: Gait belt Activity Tolerance: Patient tolerated treatment well;Patient limited by fatigue Patient left: in chair;with call bell/phone within reach;with chair alarm set Nurse Communication: Mobility status         Time: 1349-1406 PT Time Calculation (min) (ACUTE ONLY): 17 min   Charges:   PT Evaluation $Initial PT Evaluation Tier I: 1 Procedure PT Treatments $Therapeutic Activity: 8-22 mins   PT G CodesDuncan Dull 06/24/2014, 4:38 PM Alben Deeds, [place] DPT  [phone]

## 2014-06-24 NOTE — Progress Notes (Signed)
Pt had second run of Vtach lasting approximately 2 min.  MD paged of pt's anxiety, difficulty breathing, and Vtach.  BP 118/107.  Pulse ox 97% on 4L O2 n/c.   [name]

## 2014-06-24 NOTE — ED Notes (Signed)
Patient had moderate amount of stool

## 2014-06-24 NOTE — Progress Notes (Signed)
UR completed Katricia Prehn K. Larya Charpentier, RN, BSN, MSHL, CCM  06/24/2014 3:41 PM

## 2014-06-24 NOTE — Progress Notes (Signed)
Paged by RN about chest pain and Vtach that lasted approximately 2 minutes. An EKG was being obtained at that time.   Per the patient he states "I think I mis-spoke" He tells me he has been having abdominal pain, mostly below the umbilicus, with coughing for the last few months. He denies any abdominal pain or chest pain currently. He tells me coughing is the only thing that worsens the pain, however he does get short of breath due to the pain and his anxiety.  During this episode of V-tach he tells me he was coughing and had SOB due to anxiety and felt his heart was beating fast.   Currently denies SOB, diaphoresis, chest pain, abdominal pain.   Blood pressure 118/107, pulse 100, temperature 98.7 F (37.1 C), temperature source Oral, resp. rate 20, height 5\' 8"  (1.727 m), weight 94 lb 9.2 oz (42.9 kg), SpO2 96 %. In NAD, sitting up in bed  Heart sounds distant and difficult to auscultate. PMI laterally displaced  Decreased air movement bilaterally, no wheezing, rhonchi, or crackles noted +BS, soft, ND/NT  EKG: NSR, HR 95. No ST elevation or infarct.   Troponins currently negative x 2, still has 1 pending.  Will consult cardiology as this is the 2nd episode of V-tach (the first was asymptomatic and lasted 1 minute this morning).

## 2014-06-24 NOTE — Consult Note (Signed)
Name: Hector House MRN: [id number] DOB: 1946-07-01    ADMISSION DATE:  06/24/2014 CONSULTATION DATE:  06/24/2014  REFERRING MD :  Dr. Lianne Moris  CHIEF COMPLAINT:  SOB  BRIEF PATIENT DESCRIPTION: 68 year old male with history of COPD (followed by [facility], on nocturnal O2) presented 11/16 c/o SOB associated with productive cough, fever, and chills. Admitted by FMTS and treated for COPD exacerbation. PCCM consulted for additional eval.   SIGNIFICANT EVENTS    STUDIES:  10/2013 Echo - LVEF 19-50%, no diastolic dysfunction noted.  HISTORY OF PRESENT ILLNESS:  67 year old male with PMH as below, which includes CPOD (followed by [facility], on nocturnal O2 although does not meter properly), Leiomyosarcoma of thigh s/p radiation 2011, HTN, and depression. He still smokes a few cigarrettes a day and has a 75 pack year history. He presented to [facility] 11/16 early AM c/o SOB x 3-4 days. Associated symptoms include a cough productive for grey sputum and subjective fevers/chills. SOB has severely interfered with his ADL's recently and he is unable to ambulate around the house without severe dyspnea. He was admitted under FMTS and PCCM has been consulted for further eval.   PAST MEDICAL HISTORY :   has a past medical history of Hypertension; Cancer; Arthritis; Low back pain syndrome; Emphysema; S/P radiation therapy (07/23/10 - 09/14/10); Leiomyosarcoma of thigh (2011); Respiratory failure, acute (3/276/15 -11/04/13); Depression; and COPD (chronic obstructive pulmonary disease).  has past surgical history that includes Laminectomy (2008); Elbow surgery (1967); Leg skin lesion  biopsy / excision (04/20/10 / re-excision 06/18/10); Back surgery; and Elbow surgery (Left). Prior to Admission medications   Medication Sig Start Date End Date Taking? Authorizing Provider  albuterol (PROAIR HFA) 108 (90 BASE) MCG/ACT inhaler INHALE 2 PUFFS BY MOUTH EVERY 4 TO 6 HOURS AS NEEDED Patient taking differently: Inhale 2 puffs into the  lungs every 4 (four) hours as needed for wheezing or shortness of breath.  04/12/14  Yes Kathee Delton, MD  ALPRAZolam Duanne Moron) 0.5 MG tablet Take 1 tablet twice daily as needed Patient taking differently: Take 0.5 mg by mouth 2 (two) times daily as needed for anxiety. Take 1 tablet twice daily as needed 05/21/14  Yes Kathee Delton, MD  aspirin 81 MG tablet Take 1 tablet (81 mg total) by mouth daily. 12/21/13  Yes [name], MD  budesonide (PULMICORT) 0.5 MG/2ML nebulizer solution Take 2 mLs (0.5 mg total) by nebulization 2 (two) times daily. 12/21/13  Yes [name], MD  busPIRone (BUSPAR) 10 MG tablet Take 1 tablet (10 mg total) by mouth 2 (two) times daily. 12/21/13  Yes [name], MD  guaiFENesin (MUCINEX) 600 MG 12 hr tablet Take 1 tablet (600 mg total) by mouth 2 (two) times daily as needed. Patient taking differently: Take 600 mg by mouth 2 (two) times daily as needed for cough or to loosen phlegm.  12/21/13  Yes [name], MD  ipratropium-albuterol (DUONEB) 0.5-2.5 (3) MG/3ML SOLN Take 3 mLs by nebulization 4 (four) times daily. Can use an additional 2 treatments if needed. 06/07/14  Yes Kathee Delton, MD  lisinopril-hydrochlorothiazide (PRINZIDE,ZESTORETIC) 20-25 MG per tablet TAKE 1 TABLET BY MOUTH DAILY 06/14/14  Yes [name], MD  Multiple Vitamins-Minerals (CENTRUM SILVER PO) Take 1 tablet by mouth daily.    Yes Historical Provider, MD  sertraline (ZOLOFT) 25 MG tablet Take 1 tablet (25 mg total) by mouth daily. 12/21/13  Yes [name], MD  PROAIR HFA 108 [id number] BASE) MCG/ACT inhaler  INHALE 2 PUFFS BY MOUTH EVERY 4 TO 6 HOURS AS NEEDED Patient not taking: Reported on 06/24/2014 04/12/14   Kathee Delton, MD   No Known Allergies  FAMILY HISTORY:  family history includes Heart disease in his mother. SOCIAL HISTORY:  reports that he has been smoking Cigarettes.  He has a 100 pack-year smoking history. He has never used smokeless tobacco. He reports that he drinks about 1.8 oz of  alcohol per week. He reports that he does not use illicit drugs.  REVIEW OF SYSTEMS:   Bolds are positive  Constitutional: weight loss, gain, night sweats, Fevers, chills, fatigue .  HEENT: headaches, Sore throat, sneezing, nasal congestion, post nasal drip, Difficulty swallowing, Tooth/dental problems, visual complaints visual changes, ear ache CV:  chest pain, radiates: ,Orthopnea, PND, swelling in lower extremities, dizziness, palpitations, syncope.  GI  heartburn, indigestion, abdominal pain, nausea, vomiting, diarrhea, change in bowel habits, loss of appetite, bloody stools.  Resp: cough, productive: grey sputum , hemoptysis, dyspnea, chest pain, pleuritic.  Skin: rash or itching or icterus GU: dysuria, change in color of urine, retention or frequency. flank pain, hematuria  MS: joint pain or swelling. decreased range of motion  Psych: change in mood or affect. depression or anxiety.  Neuro: difficulty with speech, weakness, numbness, ataxia    SUBJECTIVE:   VITAL SIGNS: Temp:  [97.8 F (36.6 C)-98.7 F (37.1 C)] 98.7 F (37.1 C) (11/16 0500) Pulse Rate:  [88-107] 100 (11/16 0500) Resp:  [16-23] 22 (11/16 0500) BP: (84-108)/(49-73) 94/62 mmHg (11/16 0500) SpO2:  [96 %-100 %] 96 % (11/16 0852) Weight:  [42.9 kg (94 lb 9.2 oz)] 42.9 kg (94 lb 9.2 oz) (11/16 0500)  PHYSICAL EXAMINATION: General:  Cachectic male in NAD Neuro:  Alert, oriented, non-focal HEENT:  [place]/AT, PERRL Cardiovascular:  RRR, no MRG Lungs: Respirations even, pursed lip breathing, poor bilateral air movement.  Abdomen: Soft, mildly tender, particularly in lower quadrants due to what he reports as retention.  Musculoskeletal:  No acute deformity or ROM limitation.  Skin:  Intact   Recent Labs Lab 06/24/14 0041 06/24/14 0527  NA 128* 130*  K 2.7* 3.2*  CL 81* 82*  CO2  --  32  BUN 9 13  CREATININE 0.80 0.70  GLUCOSE 110* 105*    Recent Labs Lab 06/24/14 0035 06/24/14 0041 06/24/14 0527  HGB  13.4 14.6 13.5  HCT 38.3* 43.0 38.5*  WBC 7.4  --  5.7  PLT 213  --  198   Dg Chest Port 1 View  06/24/2014   CLINICAL DATA:  Shortness of breath  EXAM: PORTABLE CHEST - 1 VIEW  COMPARISON:  11/25/2013  FINDINGS: The cardiac shadow is within normal limits. The lungs are hyperinflated consistent with COPD. No focal infiltrate or sizable effusion is noted. No bony abnormality is seen.  IMPRESSION: COPD without acute abnormality.   Electronically Signed   By: Inez Catalina M.D.   On: 06/24/2014 00:51    ASSESSMENT / PLAN:  Acute exacerbation of COPD Chronic respiratory failure 2nd to COPD - Supplemental O2 as needed to maintatin SpO2 90-94% - Nebulized Duoneb q6 hours - Nebulized budesonide BID - Continue prednisone, taper as able. - Continue antibiotics for short course (~5 days) - Smoking cessation counseling - Will need PA/LAT CXR prior to DC - Please arrange follow up with Dr. Gwenette Greet or Rexene Edison NP 5-7 post discharge [phone]  Malnutrition Urinary retention EtOH abuse - Management per primary team  PCCM will be available as  needed.  Georgann Housekeeper, ACNP [facility] Pager [phone] or [phone]  Continue steroids, abx, nebulized fluid, f/u PA/LAT, arrange f/u with Dr. Gwenette Greet or [facility].  PCCM will f/u PRN.  Patient seen and examined, agree with above note.  I dictated the care and orders written for this patient under my direction.  Rush Farmer, MD [phone]

## 2014-06-24 NOTE — ED Notes (Signed)
Had a difficult time capturing pt EKG

## 2014-06-24 NOTE — ED Notes (Signed)
Family Practice Medicine at bedside assessing patient

## 2014-06-24 NOTE — H&P (Signed)
[facility] Admission History and Physical Service Pager: [phone]  Patient name: Hector House Medical record number: [id number] Date of birth: April 27, 1946 Age: 68 y.o. Gender: male  Primary Care Provider: [name], MD Consultants: Pulmononlogy Code Status: Full per discussion on admission.  Chief Complaint: Shortness of Breath  Assessment and Plan: Hector House is a 68 y.o. male presenting with shortness of breath secondary to COPD exacerbation. PMH is significant for COPD, anxiety/depression, HTN, leiomyosarcoma of right thigh s/p radiation, alcohol abuse, tobacco use, and severe protein-calorie malnutrition.  # Shortness of Breath-  DDx includes COPD exacerbation (h/o COPD, poor air entry on exam, symptoms and exam improved with nebulizer), CHF (orthopnea, paroxysmal nocturnal dyspnea, but echo in March with EF 50-55% and no LE edema or crackles), Anxiety (h/o anxiety, worse over past month). Given albuterol and atrovent in ED. Given Rocephin in ED. Satting well on room air in the ED. - Follow up BNP  - Consider repeat echo if elevated - Echo in March 2015 showed EF 50-55% - istat Troponin negative--will cycle - EKG with prolonged QTc and incomplete RBBB/LAFB (stable). - Repeat EKG in am  - CXR- COPD without acute cardiopulmonary disease - Daily weights. Monitor Intake and Output - On unknown amount of oxygen at home. Oxygen per Respiratory Therapy - Albuterol 2.5mg  q4hr (low dose due to low weight), home Pulmicort 0.5mg  - Doxycycline 100mg  BID (avoiding QT prolonging antibiotics) - Prednisone 50mg  x 5 days (11/16-) - Alert Pulmonology that he is in hospital--follows Dr. Gwenette Greet as outpatient  # Anxiety- Home medications include Buspar, Sertraline, Xanax - Receiving Ativan PRN as part of CIWA protocol - Continue Buspar 10mg  and Sertraline 25mg  - Will hold Xanax due to low BP. Consider restarting when appropriate.  # Electrolyte Abnormalities:  -  Hypokalemia- 2.7 in ED, likely to worsen with albuterol. Given 5mEq of potassium chloride  - 74mEq KDur daily started - Hyponatremia- sodium 128 - Likely due in part to poor PO. - Follow up CMET   #Severe protein-calorie malnutrition- With weight loss. Reports weight of 155 lbs five years ago and states last time he was weighed he was down to 102. Consider cancer in Ddx. Has h/o leiomyosarcoma s/p radiation and lengthy tobacco and etoh use hx. - Consult nutrition - Resource Breeze supplementation - Follow up HIV screening, RPR - PT/OT for deconditioning  # H/O HTN with Hypotension- Home medications include Lisinopril/HCTZ. No signs of sepsis otherwise. Likely related to dehydration with poor PO. - NS at 75cc/hr - Will hold BP meds due to hypotension - Range since presentation: 87-103 / 60-73 - Continue to monitor BPs and restart medications when appropriate  # Alcohol Abuse-initiate CIWA protocol - Folic Acid 1mg  and Thiamine 100mg   # Tobacco Cessation-  Interested in smoking cessation. Says he currently smokes 3 cigarrettes a day, down from 1.5ppd. - Nicotine patch 7mg   # Likely seborrheic dermatitis of chest Does not appear bothersome to patient.  - If becomes bothersome, could try selsun blue.  FEN/GI: Regular Diet supplemented with Resource Breeze, NS at 75cc/hr. Prophylaxis: Aspirin 81mg , Subcuteneous Heparin  Disposition: Admitted to [facility] telemetry, attending Dr Ree Kida. Consider home health PT at discharge though patient resistant to this idea on admission.  History of Present Illness: Hector House is a 68 y.o. male presenting with shortness of breath since yesterday that is worse than at baseline.  He has ran out of some of his medications, but he is unsure which  ones or even if they are the ones for COPD. States he still has his inhaler and the drops that go in his nebulizer. He has used his albuterol inhaler four times today and it has helped,  however the symptoms return shortly after using it.  He usually uses oxygen [place] at night and occasionally during the day, however he is unsure how much oxygen he uses; he states that he just turns the handle until he feels oxygen blowing in his nose.  Notes increase in productive cough with grey phlegm, chills, and fevers for 3-4 days. Denies any sick contacts. He has also noticed chest pressure on his left chest that occurs with the episodes of dyspnea and stress. Denies any acute stressors in his life. Reports mild leg edema and states his legs feel very tired.  Follows with Dr. Gwenette Greet for pulmonology as outpatient.  Has chronic issues with dyspnea with exertion and this has been worsening. States he has trouble walking down hallway and going outside due to his dyspnea. He is able to dress himself, however he has difficulty bathing himself because of it.  Has history of orthopnea and sleeps in a recliner; this has been a little worse in the past week.  He has noticed increase in dizziness and presyncopal symptoms over the past 5-6 months that is mildly worse. State he was 155 pounds five years ago and was 102 pounds last time he was weighed. Has had episodes of syncope in the past with last episode 1-2 months ago. He does not have great PO intake, stating he drinks a lot of soft drinks (recently began drinking more water). Has chronic PND. Denies any recent medication changes.  Has history of smoking for 50 years, stating he used to smoke 1.5ppd for 50 years. He recently cut back to 1ppd and In the past week down to 3 cig/day. States he is interested in nicotine patch and quitting smoking. Has history of alcohol abuse and states he quit drinking 1.5-2 weeks ago. Previously use to drink 4-5 12oz beers a day. Denies history of IV drug use. When questioned about social situation, he states he lives with his dog and a lady he hired a few weeks ago to live with him for chores and aid with ADLs.  Review Of Systems:  Per HPI  Otherwise 12 point review of systems was performed and was unremarkable.  Patient Active Problem List   Diagnosis Date Noted  . Elevated troponin 11/02/2013  . Protein-calorie malnutrition, severe 11/02/2013  . Acute respiratory failure with hypoxia 11/01/2013  . Anxiety 11/01/2013  . Chest pain 11/01/2013  . Acute respiratory failure 10/10/2013  . Leiomyosarcoma of Right Thigh 11/17/2012  . COPD exacerbation 06/20/2012  . History of sarcoma of soft tissue 09/20/2011  . 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 ALCOHOL USE 10/22/2009  . TOBACCO USER 10/22/2009  . HYPERTENSION, BENIGN ESSENTIAL 09/25/2009  . COPD (chronic obstructive pulmonary disease) with emphysema 09/25/2009  . LOW BACK PAIN SYNDROME 09/25/2009   Past Medical History: Past Medical History  Diagnosis Date  . Hypertension   . Cancer     skin  . Arthritis   . Low back pain syndrome     Joint Pain  . Emphysema   . S/P radiation therapy 07/23/10 - 09/14/10    Right Thigh for Leiomyosarcoma / 61.2 Gy / 34 Fractions  . Leiomyosarcoma of thigh 2011  . Respiratory failure, acute 3/276/15 -11/04/13     Hospitalized  . Depression   .  COPD (chronic obstructive pulmonary disease)    Past Surgical History: Past Surgical History  Procedure Laterality Date  . Laminectomy  2008    Three back surgeries per patient medical history form dated 05/06/10.  [facility] Elbow surgery  1967    L  . Leg skin lesion  biopsy / excision  04/20/10 / re-excision 06/18/10     Right thigh then re-excision of Lymph node right \\grion  06/18/10  . Back surgery    . Elbow surgery Left    Social History: History  Substance Use Topics  . Smoking status: Current Some Day Smoker -- 2.00 packs/day for 50 years    Types: Cigarettes    Last Attempt to Quit: 10/10/2013  . Smokeless tobacco: Never Used     Comment: pt states he is smoking 2/3 pack in 7 days.  . Alcohol Use: 1.8 oz/week    3 Cans of beer per week     Comment: Consumption per week.    Please also refer to relevant sections of EMR.  Family History: Family History  Problem Relation Age of Onset  . Heart disease Mother     heart attack   Allergies and Medications: No Known Allergies No current facility-administered medications on file prior to encounter.   Current Outpatient Prescriptions on File Prior to Encounter  Medication Sig Dispense Refill  . albuterol (PROAIR HFA) 108 (90 BASE) MCG/ACT inhaler INHALE 2 PUFFS BY MOUTH EVERY 4 TO 6 HOURS AS NEEDED (Patient taking differently: Inhale 2 puffs into the lungs every 4 (four) hours as needed for wheezing or shortness of breath. ) 8.5 g 3  . ALPRAZolam (XANAX) 0.5 MG tablet Take 1 tablet twice daily as needed (Patient taking differently: Take 0.5 mg by mouth 2 (two) times daily as needed for anxiety. Take 1 tablet twice daily as needed) 60 tablet 0  . aspirin 81 MG tablet Take 1 tablet (81 mg total) by mouth daily. 30 tablet 5  . budesonide (PULMICORT) 0.5 MG/2ML nebulizer solution Take 2 mLs (0.5 mg total) by nebulization 2 (two) times daily. 2 mL 5  . busPIRone (BUSPAR) 10 MG tablet Take 1 tablet (10 mg total) by mouth 2 (two) times daily. 60 tablet 5  . guaiFENesin (MUCINEX) 600 MG 12 hr tablet Take 1 tablet (600 mg total) by mouth 2 (two) times daily as needed. (Patient taking differently: Take 600 mg by mouth 2 (two) times daily as needed for cough or to loosen phlegm. ) 20 tablet 2  . ipratropium-albuterol (DUONEB) 0.5-2.5 (3) MG/3ML SOLN Take 3 mLs by nebulization 4 (four) times daily. Can use an additional 2 treatments if needed. 360 mL 5  . lisinopril-hydrochlorothiazide (PRINZIDE,ZESTORETIC) 20-25 MG per tablet TAKE 1 TABLET BY MOUTH DAILY 90 tablet 0  . Multiple Vitamins-Minerals (CENTRUM SILVER PO) Take 1 tablet by mouth daily.     . sertraline (ZOLOFT) 25 MG tablet Take 1 tablet (25 mg total) by mouth daily. 30 tablet 5  . PROAIR HFA 108 (90 BASE) MCG/ACT inhaler INHALE 2 PUFFS BY MOUTH EVERY 4 TO 6 HOURS AS  NEEDED (Patient not taking: Reported on 06/24/2014) 8.5 g 0    Objective: BP 93/61 mmHg  Pulse 95  Temp(Src) 97.8 F (36.6 C) (Oral)  Resp 19  SpO2 100% Exam: General: 68yo cachetic male resting comfortably but slightly anxious during conversation. Appeared very weak and could not stand reportedly due to dyspnea and fatigue. HEENT: Moist mucous membranes, PERRLA,no dentition Cardiovascular: S1 and S2 noted. Heart sounds  distant and difficult to auscultate. PMI towards left axilla. Respiratory: Clear to auscultation bilaterally. No wheezing noted. No crackles noted. Decreased respiratory movement bilaterally. Mildly short of breath throughout conversation, worsened with attempt to stand or move around in bed.  Abdomen: Bowel sounds noted. Soft and non-distended. Non-tender. Very cachetic. Extremities: No edema noted. Pulses palpable. Skin: Warm. Dry brownish crusted plaques noted on bilateral chest and abdomen over hair-bearing areas.  Neuro: Alert and oriented. Mildly confused with difficulty answering questions about medication names and doses. No focal deficits and interacting appropriately.  Labs and Imaging: CBC BMET   Recent Labs Lab 06/24/14 0035 06/24/14 0041  WBC 7.4  --   HGB 13.4 14.6  HCT 38.3* 43.0  PLT 213  --     Recent Labs Lab 06/24/14 0041  NA 128*  K 2.7*  CL 81*  BUN 9  CREATININE 0.80  GLUCOSE 110*    - istat Troponin negative  Dg Chest Port 1 View  06/24/2014   CLINICAL DATA:  Shortness of breath  EXAM: PORTABLE CHEST - 1 VIEW  COMPARISON:  11/25/2013  FINDINGS: The cardiac shadow is within normal limits. The lungs are hyperinflated consistent with COPD. No focal infiltrate or sizable effusion is noted. No bony abnormality is seen.  IMPRESSION: COPD without acute abnormality.   Electronically Signed   By: Inez Catalina M.D.   On: 06/24/2014 00:51   Lorna Few, DO 06/24/2014, 1:50 AM PGY-1, [facility] pager:  [phone], text pages welcome  I have seen and examined the patient with Dr Gerlean Ren and agree with her assessment and plan with my changes in blue. Hilton Sinclair, MD PGY-3, [place]

## 2014-06-24 NOTE — Progress Notes (Signed)
CCMD sent alert for Hector Furry, MD paged. Pt asymptomatic. [name]

## 2014-06-24 NOTE — Progress Notes (Signed)
Called for consult for VT. Telemetry reviewed. There has been no real v-tach. The episodes in question are all artifactual - sinus rhythm/sinus tach with occasional PVCs. Dr. Martinique agrees. I notified resident. Please call if we can be of further assistance. Dayna Dunn PA-C

## 2014-06-25 DIAGNOSIS — R636 Underweight: Secondary | ICD-10-CM

## 2014-06-25 DIAGNOSIS — E440 Moderate protein-calorie malnutrition: Secondary | ICD-10-CM

## 2014-06-25 LAB — BASIC METABOLIC PANEL
Anion gap: 13 (ref 5–15)
BUN: 15 mg/dL (ref 6–23)
CO2: 29 meq/L (ref 19–32)
Calcium: 8.7 mg/dL (ref 8.4–10.5)
Chloride: 90 mEq/L — ABNORMAL LOW (ref 96–112)
Creatinine, Ser: 0.71 mg/dL (ref 0.50–1.35)
GFR calc Af Amer: >90 mL/min (ref >90)
GFR calc non Af Amer: >90 mL/min (ref >90)
GLUCOSE: 97 mg/dL (ref 70–99)
Potassium: 3.7 mEq/L (ref 3.7–5.3)
SODIUM: 132 meq/L — AB (ref 137–147)

## 2014-06-25 LAB — TSH: TSH: 0.487 u[IU]/mL (ref 0.350–4.500)

## 2014-06-25 MED ORDER — SODIUM CHLORIDE 0.9 % IV BOLUS (SEPSIS)
500.0000 mL | Freq: Once | INTRAVENOUS | Status: AC
  Administered 2014-06-25: 500 mL via INTRAVENOUS

## 2014-06-25 MED ORDER — ALBUTEROL SULFATE (2.5 MG/3ML) 0.083% IN NEBU: 3.0000 mL | INHALATION_SOLUTION | Freq: Four times a day (QID) | RESPIRATORY_TRACT | Status: DC

## 2014-06-25 MED ORDER — ALBUTEROL SULFATE HFA 108 (90 BASE) MCG/ACT IN AERS
2.0000 | INHALATION_SPRAY | RESPIRATORY_TRACT | Status: DC | PRN
  Administered 2014-06-25 (×2): 2 via RESPIRATORY_TRACT
  Filled 2014-06-25: qty 6.7

## 2014-06-25 MED ORDER — LORAZEPAM 0.5 MG PO TABS
0.5000 mg | ORAL_TABLET | Freq: Four times a day (QID) | ORAL | Status: DC | PRN
Start: 2014-06-25 — End: 2014-06-25

## 2014-06-25 MED ORDER — LORAZEPAM 0.5 MG PO TABS
0.5000 mg | ORAL_TABLET | Freq: Once | ORAL | Status: AC
  Administered 2014-06-25: 0.5 mg via ORAL
  Filled 2014-06-25: qty 1

## 2014-06-25 MED ORDER — GUAIFENESIN ER 600 MG PO TB12
600.0000 mg | ORAL_TABLET | Freq: Once | ORAL | Status: AC
  Administered 2014-06-25: 600 mg via ORAL
  Filled 2014-06-25: qty 1

## 2014-06-25 NOTE — Progress Notes (Signed)
Family Medicine Teaching Service Daily Progress Note Intern Pager: [phone]  Patient name: Hector House Medical record number: [id number] Date of birth: 1946/05/31 Age: 68 y.o. Gender: male  Primary Care Provider: [name], MD Consultants: Pulmonology, Cardiology Code Status: Full  Pt Overview and Major Events to Date:  11/16 - Admitted for dyspnea  Assessment and Plan: Hector House is a 68 y.o. male presenting with shortness of breath secondary to COPD exacerbation. PMH is significant for COPD, anxiety/depression, HTN, leiomyosarcoma of right thigh s/p radiation, alcohol abuse, tobacco use, and severe protein-calorie malnutrition.  # Acute COPD exacerbation. Possibly CHF exacerbation component (orthopnea, paroxysmal nocturnal dyspnea, but echo in March with EF 50-55% and no LE edema or crackles) or  Anxiety (h/o anxiety, worse over past month). CXR shows COPD without acute cardiopulmonary disease. - s/p 1 dose of rocephin in ED - Duonebs q6hrs, home Pulmicort 0.5mg , albuterol prn - Doxycycline 100mg  BID (11/16- ) (avoiding QT prolonging antibiotics) - Prednisone 50mg  x 5 days (11/16-) - On unknown amount of oxygen at home. Oxygen per Respiratory Therapy, wean as tolerated to keep sats 90-94% - Pro-BNP 244.4 - Echo in March 2015 showed EF 50-55% - Troponin negative x3 - EKG with prolonged QTc and incomplete RBBB/LAFB (stable). - Daily weights. Monitor Intake and Output - Pulmonology consulted, appreciate recommendations, will need PA and lateral CXR prior to discharge  # Anxiety- Home medications include Buspar, Sertraline, Xanax - Receiving Ativan PRN as part of CIWA protocol - Continue Buspar 10mg  and Sertraline 25mg  - Will hold Xanax due to low BP. Consider restarting when appropriate. - TSH pending  # Electrolyte Abnormalities:  - Hypokalemia- 2.7 in ED, likely to worsen with albuterol. Given 27mEq of potassium chloride - 59mEq KDur daily started -  Hyponatremia- sodium 128 - Likely due in part to poor PO. - Continue to monitor  #Severe protein-calorie malnutrition- 155 lbs five years ago and states last time he was weighed he was down to 102. Consider cancer in Ddx. Has h/o leiomyosarcoma s/p radiation and lengthy tobacco and etoh use hx. - Consult nutrition - Resource Breeze supplementation - HIV antibody and RPR negative - PT/OT for deconditioning  # H/O HTN with Hypotension- Home medications include Lisinopril/HCTZ. No signs of sepsis otherwise. Likely related to dehydration with poor PO. - NS at 75cc/hr - Will hold BP meds due to hypotension - Continue to monitor BPs and restart medications when appropriate  # Alcohol Abuse-initiate CIWA protocol - Folic Acid 1mg  and Thiamine 100mg   # Tobacco Cessation- Interested in smoking cessation. Says he currently smokes 3 cigarrettes a day, down from 1.5ppd. - Nicotine patch 7mg   # Likely seborrheic dermatitis of chest Does not appear bothersome to patient.  - If becomes bothersome, could try selsun blue.  FEN/GI: Regular Diet supplemented with Resource Breeze, NS at 75cc/hr. Prophylaxis: Aspirin 81mg , Subcuteneous Heparin  Disposition: Admitted pending improvement of respiratory status. Will need discharge to SNF per OT and PT recs when appropriate.   Subjective:  Breathing about the same this morning, patient states that he thinks he is on less oxygen now than he is on at home. Endorses some abdominal pain secondary to rapid breathing, no chest pain. No fevers or chills.   Objective: Temp:  [97.8 F (36.6 C)-98.4 F (36.9 C)] 98.1 F (36.7 C) (11/17 0558) Pulse Rate:  [72-103] 72 (11/17 0558) Resp:  [18-26] 21 (11/17 0558) BP: (87-122)/(53-107) 103/57 mmHg (11/17 0558) SpO2:  [95 %-100 %] 98 % (11/17 0722) Weight:  [  96 lb 1.6 oz (43.591 kg)] 96 lb 1.6 oz (43.591 kg) (11/17 7628) Physical Exam: General: Cachetic, Anxious appearing male, nasal cannula in place, speaking  in full sentences  Cardiovascular: Distant heart sounds, regular rate and rhythm Respiratory: Decreased breath sounds Abdomen: +BS, soft, nonteder Extremities: No edema or cyanosis  Laboratory:  Recent Labs Lab 06/24/14 0035 06/24/14 0041 06/24/14 0527  WBC 7.4  --  5.7  HGB 13.4 14.6 13.5  HCT 38.3* 43.0 38.5*  PLT 213  --  198    Recent Labs Lab 06/24/14 0041 06/24/14 0527 06/24/14 1545  NA 128* 130* 128*  K 2.7* 3.2* 3.6*  CL 81* 82* 87*  CO2  --  32 29  BUN 9 13 19   CREATININE 0.80 0.70 0.66  CALCIUM  --  9.4 8.9  PROT  --  6.9  --   BILITOT  --  0.9  --   ALKPHOS  --  66  --   ALT  --  20  --   AST  --  23  --   GLUCOSE 110* 105* 156*    Troponin negative x3 Pro-BNP 244.4 HIV Nonreactive RPR Nonreactive  Imaging/Diagnostic Tests: Dg Chest Port 1 View 06/24/2014    IMPRESSION: COPD without acute abnormality.     Dimas Chyle, MD 06/25/2014, 7:32 AM PGY-1, [place] Intern pager: [phone], text pages welcome

## 2014-06-25 NOTE — Progress Notes (Signed)
Pt c/o of difficulty breathing. Rapid response, respiratory therapist, and MD notified. BP 148/87, HR 133, Resp 32, and O2 Sats 85% on 3.5L. Neb treatment given and pt sat in recliner with positioning upright leaning over the over bed table. No new orders given at this time. Will continue to monitor patient for further changes in condition.

## 2014-06-25 NOTE — Clinical Social Work Placement (Signed)
Clinical Social Work Department CLINICAL SOCIAL WORK PLACEMENT NOTE 06/25/2014  Patient:  Hector House, Hector House  Account Number:  [id number] Admit date:  06/24/2014  Clinical Social Worker:  Aldona Bar Dasia Guerrier, CLINICAL SOCIAL WORKER  Date/time:  06/25/2014 02:28 PM  Clinical Social Work is seeking post-discharge placement for this patient at the following level of care:   SKILLED NURSING   (*CSW will update this form in Epic as items are completed)   06/25/2014  Patient/family provided with [place] Department of Clinical Social Work's list of facilities offering this level of care within the geographic area requested by the patient (or if unable, by the patient's family).  06/25/2014  Patient/family informed of their freedom to choose among providers that offer the needed level of care, that participate in Medicare, Medicaid or managed care program needed by the patient, have an available bed and are willing to accept the patient.  06/25/2014  Patient/family informed of [facility]' ownership interest in [facility], as well as of the fact that they are under no obligation to receive care at this facility.  PASARR submitted to EDS on 06/25/2014 PASARR number received on 06/25/2014  FL2 transmitted to all facilities in geographic area requested by pt/family on  06/25/2014 FL2 transmitted to all facilities within larger geographic area on 06/25/2014  Patient informed that his/her managed care company has contracts with or will negotiate with  certain facilities, including the following:   n/a     Patient/family informed of bed offers received:   Patient chooses bed at  Physician recommends and patient chooses bed at    Patient to be transferred to  on   Patient to be transferred to facility by  Patient and family notified of transfer on  Name of family member notified:    The following physician request were entered in Epic:   Additional Comments:

## 2014-06-25 NOTE — Progress Notes (Signed)
Pt BP 89/60, denies chest pain, denies nausea and vomiting, not in respiratory distress. Dr. Sherril Cong was notified and ordered 0.9 NaCl  500 ml IV bolus. Will continue to monitor pt   06/25/14 0313  Vitals  Temp 97.8 F (36.6 C)  Temp Source Oral  BP (!) 89/60 mmHg (RN NOTIFIED Shevawn Langenberg)  BP Location Left Arm  Pulse Rate 74  Pulse Rate Source Dinamap  Resp 18  Oxygen Therapy  SpO2 100 %  O2 Device Nasal Cannula  O2 Flow Rate (L/min) 4 L/min

## 2014-06-25 NOTE — Progress Notes (Signed)
Called to room as patient was experiencing respiratory distress. By arrival to the room, patient's symptoms had partially resolved and was noted to be in moderate respiratory distress. Schedule duoneb treatment was already started.  Filed Vitals:   06/25/14 0912 06/25/14 1414 06/25/14 1415 06/25/14 1421  BP: 95/63  148/87   Pulse:   133   Temp:      TempSrc:      Resp:   32 26  Height:      Weight:      SpO2:  98% 85% 96%   PULM: Tachypneic, Decreased airflow, wheezes noted diffusely, more prominantly in right lung CV: Tachycardic, regular rhythm  Patient's symptoms continued to improve after nebulizer treatment and was back near his prior respiratory status shortly after completing his nebulizer treatment.  Algis Greenhouse. Jerline Pain, [place] Resident PGY-1 06/25/2014 2:36 PM

## 2014-06-25 NOTE — Clinical Social Work Psychosocial (Signed)
Clinical Social Work Department BRIEF PSYCHOSOCIAL ASSESSMENT 06/25/2014  Patient:  Hector House, Hector House     Account Number:  [id number]     Admit date:  06/24/2014  Clinical Social Worker:  [place], CLINICAL SOCIAL WORKER  Date/Time:  06/25/2014 02:11 PM  Referred by:  Physician  Date Referred:  06/25/2014 Referred for  Psychosocial assessment   Other Referral:   Interview type:  Patient Other interview type:   SW talked with pt in the room, SW also called friend [name].    PSYCHOSOCIAL DATA Living Status:  OTHER Admitted from facility:   Level of care:   Primary support name:  Vonna Drafts [phone] ([id number]), [name] [phone]  Sister Primary support relationship to patient:  FRIEND Degree of support available:   [name] is Mr. Langhans neighbor and friend for over 7 years. Pts friend and family are very supportive.    CURRENT CONCERNS Current Concerns  Post-Acute Placement   Other Concerns:    SOCIAL WORK ASSESSMENT / PLAN SW talked with PT at bedside. Pt was agreeable for SNF placement for a short term.Pt requested [facility] [place] because it is close to home. SW talked with Diane via telephone and she was agreeable for placement. SW will send out bed search and update pt re: bed offers.Marland KitchenFL2 complete and on chart for MD signature.Active bed search is in place. Patient states that he has not had anything to drink in 1 month and stopped smoking 2 weeks ago. He does not plan to resume smoking.  Patient is currently on CIWA protocol.  Patient states that he now has a male friend who is staying in his house- she helps with the cooking, cleaning and helping him with medications etc. She moved in about 2 weeks ago and he feels that this will help him to be able to remain at home.   Assessment/plan status:  Psychosocial Support/Ongoing Assessment of Needs Other assessment/ plan:   Information/referral to community resources:   SNF list given to patient for  review    PATIENT'S/FAMILY'S RESPONSE TO PLAN OF CARE: Pateint appeared anxious during the visit- his hands were shaking. He states that this is normal. He has been having SOB and was very jittery. Patient stated that he will be returning home after rehab.Pt was agreeable for placement for short term only and worries a great deal about his Social Security check.  Diane- Patient's friend was pleasant and agreeable for placement; she stated that she helps patient when he is at home as she lives next door. Cherlynn Polo, BSW Intern

## 2014-06-25 NOTE — Discharge Summary (Signed)
[facility] Discharge Summary  Patient name: Hector House Medical record number: [id number] Date of birth: 1945/10/03 Age: 68 y.o. Gender: male Date of Admission: 06/24/2014  Date of Discharge:06/27/2014  Admitting Physician: Lupita Dawn, MD  Primary Care Provider: [name], MD Consultants: Pulmonology, Cardiology  Indication for Hospitalization: Acute COPD exacerbation  Discharge Diagnoses/Problem List:  COPD exacerbation, Anxiety, Severe protein-calorie malnutrition, History of hypertension with hypotension, Alcohol abuse, Tobacco Abuse  Disposition: SNF  Discharge Condition: Improved  Discharge Exam: Please see progress note for day of discharge.   Brief Hospital Course:  Hector House is a 68 y.o. Male who presented with shortness of breath secondary to COPD exacerbation. PMH is significant for COPD, anxiety/depression, HTN, leiomyosarcoma of right thigh s/p radiation, alcohol abuse, tobacco use, and severe protein-calorie malnutrition.  His hospital course, by problem, is listed below:  # Acute COPD exacerbation. On admission we considered possible CHF exacerbation component (orthopnea, paroxysmal nocturnal dyspnea), however patient had echo in March with EF 50-55% and additionally no LE edema or crackles on admission. Cardiac work up, including cycled troponins and admission EKG was negative. His presentation also likely had a component of anxiety (h/o anxiety, worsened over past month). CXR on admission showed COPD without acute cardiopulmonary disease. He was started on duonebs, oral prednisone, doxycycline and continued on his home pulmicort. Patient was unsure of his home oxygen use, but was able to be weaned to 4L at the time of discharge.   # Anxiety/Tremors- Patient was continued on his home medications include Buspar and Sertraline, and additionally received ativan per CIWA protocol as outlined below. Patient reported baseline tremors  associated with his anxiety. TSH was within normal limits. Home Xanax was held in setting of hypotension. Tremors likely secondary to anxiety, however potentially essential tremor or parkinsonism.  # Electrolyte Abnormalities: On admission, patient was noted to be hypokalemic (2.7) and hyponatremic (128), likely related to decreased PO intake and malnutrition. His electrolytes were repleted prn and stable at the time of discharge.   #Severe protein-calorie malnutrition- Patient with approximately 50 pound weight loss over past 5 years. Nutrition was consulted. Potentially malignancy related at patient has h/o leiomyosarcoma s/p radiation and lengthy tobacco and etoh use hx. HIV antibody and RPR were nonreactive.   # H/O HTN with Hypotension- Held home Lisinopril/HCTZ given hypotension. No signs of sepsis otherwise. Likely related to dehydration with poor PO.  # Alcohol Abuse-Patient placed on CIWA protocol in addition to Folic Acid 1mg  and Thiamine 100mg . Patient received intermittent doses of ativan due to anxiety and tremors, however experienced no significant withdrawal symptoms during his stay.   # Tobacco Cessation- Interested in smoking cessation. Says he currently smokes 3 cigarrettes a day, down from 1.5ppd. Placed on nicotine patch here.   Issues for Follow Up:  1) Respiratory status 2) f/u BP- held home lisinopril-HCTZ here in setting of hypotension, restart as appropriate as outpatient 3) Can consider further work-up or referral for tremors, can consider trial of propanolol or neurology referral 4) Consider low dose CT to screen for lung cancer given smoking history 5) Counsel regarding smoking cessation and alcohol abuse.    Significant Procedures: None  Significant Labs and Imaging:   Recent Labs Lab 06/24/14 0035 06/24/14 0041 06/24/14 0527  WBC 7.4  --  5.7  HGB 13.4 14.6 13.5  HCT 38.3* 43.0 38.5*  PLT 213  --  198    Recent Labs Lab 06/24/14 0041 06/24/14 0527  06/24/14 1545  06/25/14 0900 06/26/14 0516  NA 128* 130* 128* 132* 138  K 2.7* 3.2* 3.6* 3.7 3.9  CL 81* 82* 87* 90* 99  CO2  --  32 29 29 30   GLUCOSE 110* 105* 156* 97 88  BUN 9 13 19 15 12   CREATININE 0.80 0.70 0.66 0.71 0.60  CALCIUM  --  9.4 8.9 8.7 8.4  ALKPHOS  --  66  --   --   --   AST  --  23  --   --   --   ALT  --  20  --   --   --   ALBUMIN  --  4.1  --   --   --    TSH 0.487  Results/Tests Pending at Time of Discharge: None  Discharge Medications:    Medication List    STOP taking these medications        lisinopril-hydrochlorothiazide 20-25 MG per tablet  Commonly known as:  PRINZIDE,ZESTORETIC      TAKE these medications        ALPRAZolam 0.5 MG tablet  Commonly known as:  XANAX  Take 1 tablet twice daily as needed     aspirin 81 MG tablet  Take 1 tablet (81 mg total) by mouth daily.     budesonide 0.5 MG/2ML nebulizer solution  Commonly known as:  PULMICORT  Take 2 mLs (0.5 mg total) by nebulization 2 (two) times daily.     busPIRone 10 MG tablet  Commonly known as:  BUSPAR  Take 1 tablet (10 mg total) by mouth 2 (two) times daily.     CENTRUM SILVER PO  Take 1 tablet by mouth daily.     doxycycline 100 MG tablet  Commonly known as:  VIBRA-TABS  Take 1 tablet (100 mg total) by mouth every 12 (twelve) hours.     guaiFENesin 600 MG 12 hr tablet  Commonly known as:  MUCINEX  Take 1 tablet (600 mg total) by mouth 2 (two) times daily as needed.     ipratropium-albuterol 0.5-2.5 (3) MG/3ML Soln  Commonly known as:  DUONEB  Take 3 mLs by nebulization 4 (four) times daily. Can use an additional 2 treatments if needed.     predniSONE 50 MG tablet  Commonly known as:  DELTASONE  Take 1 tablet (50 mg total) by mouth daily.     PROAIR HFA 108 (90 BASE) MCG/ACT inhaler  Generic drug:  albuterol  INHALE 2 PUFFS BY MOUTH EVERY 4 TO 6 HOURS AS NEEDED     albuterol 108 (90 BASE) MCG/ACT inhaler  Commonly known as:  PROAIR HFA  INHALE 2 PUFFS BY  MOUTH EVERY 4 TO 6 HOURS AS NEEDED     sertraline 25 MG tablet  Commonly known as:  ZOLOFT  Take 1 tablet (25 mg total) by mouth daily.        Discharge Instructions: Please refer to Patient Instructions section of EMR for full details.  Patient was counseled important signs and symptoms that should prompt return to medical care, changes in medications, dietary instructions, activity restrictions, and follow up appointments.   Follow-Up Appointments:  Discharged to SNF Follow-up Information    Follow up with Kathee Delton, MD.   Specialty:  Pulmonary Disease   Contact information:   [place] [address] [phone]       Follow up with [name], MD.   Specialty:  Family Medicine   Contact information:   [address] [place] [address] [phone]       Dimas Chyle, MD 06/27/2014, 12:03 PM PGY-1, [place]

## 2014-06-25 NOTE — Significant Event (Signed)
Rapid Response Event Note  Overview: Called to assist with patient in resp distress Time Called: 1405 Arrival Time: 1408 Event Type: Respiratory  Initial Focused Assessment:  On arrival patient sitting on edge of bed in tripod position - using accessory muscles to breathe - speaking few words - appears air [place] - bil BS decreased with wheezing noted - very tight - ST on monitor RR 38-42 = O2 sats 85%.  Per staff patient refusing nebulizer thru mask.  He states " I would smother - can't breathe now".   Tremors - staff says he has baseline tremors. Dr. Hetty Blend and IM staff present.    Interventions:  Patient nods yes to question if he sleeps in chair - transferred to recliner chair - to high fowlers with bedside table - Duoneb set up with mouthpiece - patient able to use this instead of mask for treatment - within minute resps slowing decreased distress - patient stating he is breathing better - finished duoneb - RR 26 - O2 sat 96% on 4 liters - decreased use of accessory muscles - patient has baseline tremors.  Anti-anxiety treatment reviewed with current medical staff - patient on Xanax at home - getting Ativan here for CIWA.  15 minutes and patient is back to baseline. Tremors remain but much quieter.  Bil BS more air movement but continued wheeze. Handoff to [facility] RN - to call as needed.    Event Summary: Name of Physician Notified: Dr. Hetty Blend at  (pta rrt)    at    Outcome: Stayed in room and stabalized  Event End Time: [place]  Quin Hoop

## 2014-06-26 LAB — BASIC METABOLIC PANEL
ANION GAP: 9 (ref 5–15)
BUN: 12 mg/dL (ref 6–23)
CHLORIDE: 99 meq/L (ref 96–112)
CO2: 30 mEq/L (ref 19–32)
Calcium: 8.4 mg/dL (ref 8.4–10.5)
Creatinine, Ser: 0.6 mg/dL (ref 0.50–1.35)
GFR calc Af Amer: >90 mL/min (ref >90)
GFR calc non Af Amer: >90 mL/min (ref >90)
Glucose, Bld: 88 mg/dL (ref 70–99)
POTASSIUM: 3.9 meq/L (ref 3.7–5.3)
Sodium: 138 mEq/L (ref 137–147)

## 2014-06-26 NOTE — Progress Notes (Signed)
Family Medicine Teaching Service Daily Progress Note Intern Pager: [phone]  Patient name: Hector House Medical record number: [id number] Date of birth: 27-Jul-1946 Age: 68 y.o. Gender: male  Primary Care Provider: [name], MD Consultants: Pulmonology, Cardiology Code Status: Full  Pt Overview and Major Events to Date:  11/16 - Admitted for dyspnea  Assessment and Plan: Hector House is a 68 y.o. male presenting with shortness of breath secondary to COPD exacerbation. PMH is significant for COPD, anxiety/depression, HTN, leiomyosarcoma of right thigh s/p radiation, alcohol abuse, tobacco use, and severe protein-calorie malnutrition.  # Acute COPD exacerbation. Possibly CHF exacerbation component (orthopnea, paroxysmal nocturnal dyspnea, but echo in March with EF 50-55% and no LE edema or crackles). Also likely a component of anxiety playing a role in the patient's presentation (h/o anxiety, worse over past month). CXR shows COPD without acute cardiopulmonary disease. - s/p 1 dose of rocephin in ED - Duonebs q6hrs, home Pulmicort 0.5mg , albuterol prn - Doxycycline 100mg  BID (11/16- ) (avoiding QT prolonging antibiotics) - Prednisone 50mg  x 5 days (11/16-) - On unknown amount of oxygen at home. Oxygen per Respiratory Therapy, wean as tolerated to keep sats 90-94% - Pro-BNP 244.4 - Echo in March 2015 showed EF 50-55% - Troponin negative x3 - EKG with prolonged QTc and incomplete RBBB/LAFB (stable). - Daily weights. Monitor Intake and Output - Pulmonology consulted, appreciate recommendations, will need PA and lateral CXR prior to discharge  # Anxiety- Home medications include Buspar, Sertraline, Xanax - Receiving Ativan PRN as part of CIWA protocol, continues to have significant CIWA scores (11-13) - Continue Buspar 10mg  and Sertraline 25mg  - Will hold Xanax due to low BP. Consider restarting when appropriate. - TSH 0.487  # Electrolyte Abnormalities:  - Hypokalemia-2.7 on  admission, 27mEq KDur daily started - Hyponatremia-128 on admission, resolved.  - Continue to monitor   #Severe protein-calorie malnutrition- 155 lbs five years ago and states last time he was weighed he was down to 102. Consider cancer in Ddx. Has h/o leiomyosarcoma s/p radiation and lengthy tobacco and etoh use hx. - Consult nutrition - Resource Breeze supplementation - HIV antibody and RPR negative - PT/OT for deconditioning  # H/O HTN with Hypotension- Home medications include Lisinopril/HCTZ. No signs of sepsis otherwise. Likely related to dehydration with poor PO. - NS at 75cc/hr - Will hold BP meds due to hypotension - Continue to monitor BPs and restart medications when appropriate  # Alcohol Abuse- On CIWA protocol - Folic Acid 1mg  and Thiamine 100mg   # Tobacco Cessation- Interested in smoking cessation. Says he currently smokes 3 cigarrettes a day, down from 1.5ppd. - Nicotine patch 7mg   # Likely seborrheic dermatitis of chest Does not appear bothersome to patient.  - If becomes bothersome, could try selsun blue.  FEN/GI: Regular Diet supplemented with Resource Breeze, NS at 75cc/hr. Prophylaxis: Aspirin 81mg , Subcuteneous Heparin  Disposition: Admitted pending improvement of respiratory status and anxiety/withdrawal. Will need discharge to SNF per OT and PT recs when appropriate.   Subjective:  Patient reports that he feels like his breathing better this morning. No chest pain. No fevers or chills.    Objective: Temp:  [97.2 F (36.2 C)-98.7 F (37.1 C)] 98.7 F (37.1 C) (11/18 0628) Pulse Rate:  [80-133] 114 (11/18 0628) Resp:  [20-32] 22 (11/18 0628) BP: (95-148)/(55-87) 101/64 mmHg (11/18 0628) SpO2:  [85 %-99 %] 94 % (11/18 0818) Weight:  [99 lb 9.6 oz (45.178 kg)] 99 lb 9.6 oz (45.178 kg) (11/18 3474) Physical  Exam: General: Cachetic, Anxious appearing male, nasal cannula in place, speaking in full sentences  Cardiovascular: Distant heart sounds, regular  rate and rhythm Respiratory: Mildly increased work of breathing, Decreased breath sounds Abdomen: +BS, soft, nonteder Extremities: No edema or cyanosis  Laboratory:  Recent Labs Lab 06/24/14 0035 06/24/14 0041 06/24/14 0527  WBC 7.4  --  5.7  HGB 13.4 14.6 13.5  HCT 38.3* 43.0 38.5*  PLT 213  --  198    Recent Labs Lab 06/24/14 0527 06/24/14 1545 06/25/14 0900 06/26/14 0516  NA 130* 128* 132* 138  K 3.2* 3.6* 3.7 3.9  CL 82* 87* 90* 99  CO2 32 29 29 30   BUN 13 19 15 12   CREATININE 0.70 0.66 0.71 0.60  CALCIUM 9.4 8.9 8.7 8.4  PROT 6.9  --   --   --   BILITOT 0.9  --   --   --   ALKPHOS 66  --   --   --   ALT 20  --   --   --   AST 23  --   --   --   GLUCOSE 105* 156* 97 88    Troponin negative x3 Pro-BNP 244.4 HIV Nonreactive RPR Nonreactive  Imaging/Diagnostic Tests: Dg Chest Port 1 View 06/24/2014    IMPRESSION: COPD without acute abnormality.     Dimas Chyle, MD 06/26/2014, 8:22 AM PGY-1, [place] Intern pager: [phone], text pages welcome

## 2014-06-26 NOTE — Progress Notes (Signed)
Physical Therapy Treatment Patient Details Name: Hector House MRN: [id number] DOB: 22-Sep-1945 Today's Date: 06/26/2014    History of Present Illness 68 year old male with history of COPD (followed by [facility], on nocturnal O2) presented 11/16 c/o SOB associated with productive cough, fever, and chills. Admitted by FMTS and treated for COPD exacerbation.    PT Comments    **Pt ambulated to/from bathroom (15'x2) with 4L O2 and min/guard assist for balance. Pt became significantly SOB with respiratory rate of 36 with mobility, SaO2 94% on 4L O2, HR 91. Pt required 5 minutes of pursed lip breathing to recover. Discussed energy conservation techniques as pt has such limited tolerance of mobility. Will try WC mobility next visit.  *  Follow Up Recommendations  SNF;Supervision/Assistance - 24 hour     Equipment Recommendations  Wheelchair (measurements PT) (WC recommended due to very limited tolerance of walking)    Recommendations for Other Services       Precautions / Restrictions Precautions Precautions: Fall Restrictions Weight Bearing Restrictions: No    Mobility  Bed Mobility Overal bed mobility: Modified Independent Bed Mobility: Supine to Sit     Supine to sit: Modified independent (Device/Increase time);HOB elevated     General bed mobility comments: HOB elevated 50*, used bedrail  Transfers Overall transfer level: Needs assistance Equipment used: None Transfers: Sit to/from Stand Sit to Stand: Supervision         General transfer comment: supervision for safety  Ambulation/Gait Ambulation/Gait assistance: Min guard Ambulation Distance (Feet): 30 Feet (15' x 2 (bed to bathroom to bed) with seated rest break) Assistive device: 1 person hand held assist Gait Pattern/deviations: Step-to pattern;Decreased stride length Gait velocity: decreased   General Gait Details: Significant instability noted with ambulation, decreased activity tolerance 2* SOB; Respiratory rate  36 after walking 15', SaO2 on 4L O2 94% with walking, frequent cues for pursed lip breathing; discussed energy conservation, pt open to trial of WC   Stairs            Wheelchair Mobility    Modified Rankin (Stroke Patients Only)       Balance     Sitting balance-Leahy Scale: Fair       Standing balance-Leahy Scale: Poor                      Cognition Arousal/Alertness: Awake/alert Behavior During Therapy: WFL for tasks assessed/performed Overall Cognitive Status: Within Functional Limits for tasks assessed                      Exercises      General Comments        Pertinent Vitals/Pain Pain Assessment: No/denies pain    Home Living                      Prior Function            PT Goals (current goals can now be found in the care plan section) Acute Rehab PT Goals Patient Stated Goal: to get stronger PT Goal Formulation: With patient Time For Goal Achievement: 07/08/14 Potential to Achieve Goals: Fair Progress towards PT goals: Progressing toward goals    Frequency  Min 2X/week    PT Plan Current plan remains appropriate    Co-evaluation             End of Session Equipment Utilized During Treatment: Gait belt;Oxygen Activity Tolerance: Patient limited by fatigue;Treatment limited secondary to medical  complications (Comment) (SOB) Patient left: with call bell/phone within reach;in bed     Time: 2353-6144 PT Time Calculation (min) (ACUTE ONLY): 24 min  Charges:  $Gait Training: 8-22 mins $Therapeutic Activity: 8-22 mins                    G Codes:      Philomena Doheny 06/26/2014, 11:32 AM [phone]

## 2014-06-27 ENCOUNTER — Other Ambulatory Visit: Payer: Self-pay | Admitting: Family Medicine

## 2014-06-27 MED ORDER — DOXYCYCLINE HYCLATE 100 MG PO TABS: 100.0000 mg | ORAL_TABLET | Freq: Two times a day (BID) | ORAL | Status: AC

## 2014-06-27 MED ORDER — LORAZEPAM 2 MG/ML IJ SOLN: 0.5000 mg | Freq: Four times a day (QID) | INTRAMUSCULAR | Status: DC | PRN

## 2014-06-27 MED ORDER — LORAZEPAM 0.5 MG PO TABS: 0.5000 mg | ORAL_TABLET | Freq: Four times a day (QID) | ORAL | Status: DC | PRN

## 2014-06-27 MED ORDER — PREDNISONE 50 MG PO TABS: 50.0000 mg | ORAL_TABLET | Freq: Every day | ORAL | Status: AC

## 2014-06-27 MED ORDER — ALPRAZOLAM 0.5 MG PO TABS: ORAL_TABLET | ORAL | Status: DC

## 2014-06-27 NOTE — Progress Notes (Signed)
Family Medicine Teaching Service Daily Progress Note Intern Pager: [phone]  Patient name: Hector House Medical record number: [id number] Date of birth: 06-29-1946 Age: 68 y.o. Gender: male  Primary Care Provider: [name], MD Consultants: Pulmonology, Cardiology Code Status: Full  Pt Overview and Major Events to Date:  11/16 - Admitted for dyspnea  Assessment and Plan: BREYLON SHERROW is a 68 y.o. male presenting with shortness of breath secondary to COPD exacerbation. PMH is significant for COPD, anxiety/depression, HTN, leiomyosarcoma of right thigh s/p radiation, alcohol abuse, tobacco use, and severe protein-calorie malnutrition.  # Acute COPD exacerbation. Possibly CHF exacerbation component contributing to dyspnea (orthopnea, paroxysmal nocturnal dyspnea, but echo in March with EF 50-55% and no LE edema or crackles). Also likely a component of anxiety playing a role in the patient's presentation (h/o anxiety, worse over past month). CXR shows COPD without acute cardiopulmonary disease. - s/p 1 dose of rocephin in ED - Duonebs q6hrs, home Pulmicort 0.5mg , albuterol prn - Doxycycline 100mg  BID (11/16- ) (avoiding QT prolonging antibiotics) - Prednisone 50mg  x 5 days (11/16-) - On unknown amount of oxygen at home. Oxygen per Respiratory Therapy, wean as tolerated to keep sats 90-94% - Pro-BNP 244.4 - Echo in March 2015 showed EF 50-55% - Troponin negative x3 - EKG with prolonged QTc and incomplete RBBB/LAFB (stable). - Daily weights. Monitor Intake and Output - Pulmonology consulted, appreciate recommendations, will need PA and lateral CXR prior to discharge  # Anxiety- Home medications include Buspar, Sertraline, Xanax - Receiving Ativan PRN as part of CIWA protocol - Continue Buspar 10mg  and Sertraline 25mg  - Will hold Xanax due to low BP. Consider restarting when appropriate. - TSH 0.487  # Electrolyte Abnormalities:  - Hypokalemia-2.7 on admission, 47mEq KDur daily  started, resolved - Hyponatremia-128 on admission, resolved.  - Continue to monitor   #Severe protein-calorie malnutrition- 155 lbs five years ago and states last time he was weighed he was down to 102. Consider cancer in Ddx. Has h/o leiomyosarcoma s/p radiation and lengthy tobacco and etoh use hx. - Consult nutrition - Resource Breeze supplementation - HIV antibody and RPR negative - PT/OT for deconditioning  # H/O HTN with Hypotension- Home medications include Lisinopril/HCTZ. No signs of sepsis otherwise. Likely related to dehydration with poor PO. - NS at 75cc/hr - Will hold BP meds due to hypotension - Continue to monitor BPs and restart medications when appropriate  # Alcohol Abuse- On CIWA protocol - Folic Acid 1mg  and Thiamine 100mg   # Tobacco Cessation- Interested in smoking cessation. Says he currently smokes 3 cigarrettes a day, down from 1.5ppd. - Nicotine patch 7mg   # Likely seborrheic dermatitis of chest Does not appear bothersome to patient.  - If becomes bothersome, could try selsun blue.  FEN/GI: Regular Diet supplemented with Resource Breeze, NS at 75cc/hr. Prophylaxis: Aspirin 81mg , Subcuteneous Heparin  Disposition: Admitted pending improvement of respiratory status and anxiety/withdrawal. Will need discharge to SNF per OT and PT recs when appropriate.   Subjective:  Patient reports some anxiety about going to SNF. Says that it is affecting his breathing and making his breathing "tight." No fevers or chills. No chest pain.  Objective: Temp:  [97.8 F (36.6 C)-98 F (36.7 C)] 98 F (36.7 C) (11/19 1478) Pulse Rate:  [67-78] 69 (11/19 0614) Resp:  [20-22] 21 (11/19 0614) BP: (104-112)/(51-70) 112/70 mmHg (11/19 0614) SpO2:  [94 %-100 %] 97 % (11/19 0614) Weight:  [101 lb 14.4 oz (46.222 kg)] 101 lb 14.4 oz (46.222  kg) (11/19 9242) Physical Exam: General: Cachetic, Anxious appearing male, nasal cannula in place, speaking in full sentences   Cardiovascular: Distant heart sounds, regular rate and rhythm Respiratory: Mildly increased work of breathing, Decreased breath sounds, no wheezes or crackles Abdomen: +BS, soft, nonteder Extremities: No edema or cyanosis  Laboratory:  Recent Labs Lab 06/24/14 0035 06/24/14 0041 06/24/14 0527  WBC 7.4  --  5.7  HGB 13.4 14.6 13.5  HCT 38.3* 43.0 38.5*  PLT 213  --  198    Recent Labs Lab 06/24/14 0527 06/24/14 1545 06/25/14 0900 06/26/14 0516  NA 130* 128* 132* 138  K 3.2* 3.6* 3.7 3.9  CL 82* 87* 90* 99  CO2 32 29 29 30   BUN 13 19 15 12   CREATININE 0.70 0.66 0.71 0.60  CALCIUM 9.4 8.9 8.7 8.4  PROT 6.9  --   --   --   BILITOT 0.9  --   --   --   ALKPHOS 66  --   --   --   ALT 20  --   --   --   AST 23  --   --   --   GLUCOSE 105* 156* 97 88    Troponin negative x3 Pro-BNP 244.4 HIV Nonreactive RPR Nonreactive TSH 0.487  Imaging/Diagnostic Tests: Dg Chest Port 1 View 06/24/2014    IMPRESSION: COPD without acute abnormality.     Dimas Chyle, MD 06/27/2014, 8:09 AM PGY-1, [place] Intern pager: [phone], text pages welcome

## 2014-06-27 NOTE — Telephone Encounter (Signed)
Contacted by Social Work concerning Hector House. Stated Hector House was discharged earlier today to SNF and his prescription for Xanax was not sent with him. Refilled medication and left prescription with his nurse on 6E. Will be picked up by SNF today.

## 2014-06-27 NOTE — Clinical Social Work Placement (Signed)
     Clinical Social Work Department CLINICAL SOCIAL WORK PLACEMENT NOTE 06/27/2014  Patient:  Hector House, Hector House  Account Number:  [id number] Admit date:  06/24/2014  Clinical Social Worker:  Aldona Bar DOLLARHITE, CLINICAL SOCIAL WORKER  Date/time:  06/25/2014 02:28 PM  Clinical Social Work is seeking post-discharge placement for this patient at the following level of care:   SKILLED NURSING   (*CSW will update this form in Epic as items are completed)   06/25/2014  Patient/family provided with [place] Department of Clinical Social Works list of facilities offering this level of care within the geographic area requested by the patient (or if unable, by the patients family).  06/25/2014  Patient/family informed of their freedom to choose among providers that offer the needed level of care, that participate in Medicare, Medicaid or managed care program needed by the patient, have an available bed and are willing to accept the patient.  06/25/2014  Patient/family informed of [facility] ownership interest in [facility], as well as of the fact that they are under no obligation to receive care at this facility.  PASARR submitted to EDS on 06/25/2014 PASARR number received on 06/25/2014  FL2 transmitted to all facilities in geographic area requested by pt/family on  06/25/2014 FL2 transmitted to all facilities within larger geographic area on 06/25/2014  Patient informed that his/her managed care company has contracts with or will negotiate with  certain facilities, including the following:   n/a     Patient/family informed of bed offers received:  06/27/2014 Patient chooses bed at [facility] [place] Physician recommends and patient chooses bed at    Patient to be transferred to [place] on  06/27/2014 Patient to be transferred to facility by [facility]) Patient and family notified of transfer on 06/27/2014 Name of family member  notified:  [name]- Sister  The following physician request were entered in Epic: Physician Request  Please prepare priority discharge summary and prescriptions.  Please sign FL2.    Additional Comments: 06/27/14  Ok per MD for d/c today to SNF. Bed offers were discussed with patient yesterday and per his permission- with his neighbor [name] who helps him at home.  They chose the [facility] as it was close to where patient lives.  Today- [name] notified patient's sister [name] who went to facility to sign the admission paperwork but later rescinded it stating "I cannot be responsible for my brothers care".  She stated that she did not want him placed at [facility]; CSW explained that this is where patient chose.  He was upset this morning telling [name] that he wanted to go home.  He felt that [name] and his roommate could "take care of him" but he was encouraged to seek short term SNF in nursing center. Patient eventually agreed today for short term but appeared anxious and somewhat irritated (he states this is his baseline". Patient related to [name] that "everyone knew i was going except me."  CSW reminded him of past 2 visits in which SNF was discussed; also - BSW intern visited [place] with patient about d/c.  He relented on this and said "you're right but I don't want to argue with you-  I"M GOING TO GO NOW!"  EMS transport arranged and patient was d/c'd.  RN notified to call report.  No further CSW needs identiied.  CSW signing off.

## 2014-06-27 NOTE — Discharge Instructions (Signed)
You were admitted with a COPD exacerbation. While here, you were given breathing treatments, oral steroids, and antibiotics.   Chronic Obstructive Pulmonary Disease Chronic obstructive pulmonary disease (COPD) is a common lung problem. In COPD, the flow of air from the lungs is limited. The way your lungs work will probably never return to normal, but there are things you can do to improve your lungs and make yourself feel better. HOME CARE  Take all medicines as told by your doctor.  Avoid medicines or cough syrups that dry up your airway (such as antihistamines) and do not allow you to get rid of thick spit. You do not need to avoid them if told differently by your doctor.  If you smoke, stop. Smoking makes the problem worse.  Avoid being around things that make your breathing worse (like smoke, chemicals, and fumes).  Use oxygen therapy and therapy to help improve your lungs (pulmonary rehabilitation) if told by your doctor. If you need home oxygen therapy, ask your doctor if you should buy a tool to measure your oxygen level (oximeter).  Avoid people who have a sickness you can catch (contagious).  Avoid going outside when it is very hot, cold, or humid.  Eat healthy foods. Eat smaller meals more often. Rest before meals.  Stay active, but remember to also rest.  Make sure to get all the shots (vaccines) your doctor recommends. Ask your doctor if you need a pneumonia shot.  Learn and use tips on how to relax.  Learn and use tips on how to control your breathing as told by your doctor. Try:  Breathing in (inhaling) through your nose for 1 second. Then, pucker your lips and breath out (exhale) through your lips for 2 seconds.  Putting one hand on your belly (abdomen). Breathe in slowly through your nose for 1 second. Your hand on your belly should move out. Pucker your lips and breathe out slowly through your lips. Your hand on your belly should move in as you breathe out.  Learn  and use controlled coughing to clear thick spit from your lungs. The steps are: 1. Lean your head a little forward. 2. Breathe in deeply. 3. Try to hold your breath for 3 seconds. 4. Keep your mouth slightly open while coughing 2 times. 5. Spit any thick spit out into a tissue. 6. Rest and do the steps again 1 or 2 times as needed. GET HELP IF:  You cough up more thick spit than usual.  There is a change in the color or thickness of the spit.  It is harder to breathe than usual.  Your breathing is faster than usual. GET HELP RIGHT AWAY IF:   You have shortness of breath while resting.  You have shortness of breath that stops you from:  Being able to talk.  Doing normal activities.  You chest hurts for longer than 5 minutes.  Your skin color is more blue than usual.  Your pulse oximeter shows that you have low oxygen for longer than 5 minutes. MAKE SURE YOU:   Understand these instructions.  Will watch your condition.  Will get help right away if you are not doing well or get worse. Document Released: 01/12/2008 Document Revised: 12/10/2013 Document Reviewed: 03/22/2013 [facility] Patient Information 2015 [place], [place]. This information is not intended to replace advice given to you by your health care provider. Make sure you discuss any questions you have with your health care provider.

## 2014-06-27 NOTE — Plan of Care (Signed)
Problem: Phase I Progression Outcomes Goal: O2 sats > or equal 90% or at baseline Outcome: Progressing Goal: Dyspnea controlled at rest Outcome: Progressing Goal: Hemodynamically stable Outcome: Progressing Goal: Pain controlled Outcome: Completed/Met Date Met:  06/27/14 Goal: Pt OOB to Walk or Exercise Daily With Nursing or PT Patient OOB to walk or exercise daily with nursing or PT if activity order permits  Outcome: Progressing Goal: Tolerating diet Outcome: Completed/Met Date Met:  06/27/14

## 2014-07-02 ENCOUNTER — Encounter: Payer: Self-pay | Admitting: Internal Medicine

## 2014-07-02 ENCOUNTER — Non-Acute Institutional Stay (SKILLED_NURSING_FACILITY): Payer: Medicare Other | Admitting: Internal Medicine

## 2014-07-02 DIAGNOSIS — F17200 Nicotine dependence, unspecified, uncomplicated: Secondary | ICD-10-CM

## 2014-07-02 DIAGNOSIS — J441 Chronic obstructive pulmonary disease with (acute) exacerbation: Secondary | ICD-10-CM

## 2014-07-02 DIAGNOSIS — F1099 Alcohol use, unspecified with unspecified alcohol-induced disorder: Secondary | ICD-10-CM

## 2014-07-02 DIAGNOSIS — J438 Other emphysema: Secondary | ICD-10-CM

## 2014-07-02 DIAGNOSIS — R636 Underweight: Secondary | ICD-10-CM

## 2014-07-02 DIAGNOSIS — F419 Anxiety disorder, unspecified: Secondary | ICD-10-CM

## 2014-07-02 DIAGNOSIS — Z7289 Other problems related to lifestyle: Secondary | ICD-10-CM

## 2014-07-02 DIAGNOSIS — E43 Unspecified severe protein-calorie malnutrition: Secondary | ICD-10-CM

## 2014-07-02 DIAGNOSIS — Z789 Other specified health status: Secondary | ICD-10-CM

## 2014-07-02 DIAGNOSIS — Z720 Tobacco use: Secondary | ICD-10-CM

## 2014-07-02 DIAGNOSIS — J9601 Acute respiratory failure with hypoxia: Secondary | ICD-10-CM

## 2014-07-02 NOTE — Progress Notes (Signed)
Patient ID: Hector House, male   DOB: [birth year], 68 y.o.   MRN: [id number]  Provider:  Rexene Edison. Mariea Clonts, D.O., C.M.D. Location:  [facility]  PCP: [name], MD  Code Status: DNR  No Known Allergies  Chief Complaint  Patient presents with  . New Admit To SNF    s/p hospitalization for COPD exacerbation, also had severe malnutrition, weight loss of 50 lbs over 5 years (prior leiomyosarcoma), hypotension (ace/hctz held and xanax), essential tremor, anxiety, alcohol and tobacco dependence    HPI: 68 y.o. male  With h/o severe COPD with chronic respiratory failure, had been smoking until 2 mos ago, alcohol dependence (admits alcohol had been his primary source of nutrition), hypertension, low back pain, depression seen for admission to snf s/p hospitalization for COPD exacerbation.  He is gradually recovering.  He was seen participating in therapy riding the exercise bike.  He gets winded after ambulating just 50 feet and must rest.  He had difficulty with hypotension during his hospital stay so several meds were held there including his ace/hctz and xanax.  He has lost 50 lbs in the past 3 years due to his habits and COPD.  He was noted to have a tremor that may require treatment if he desires.  He has completed prednisone and doxycyline therapy for his copd exacerbation.  He is on home O2.    ROS: Review of Systems  Constitutional: Positive for malaise/fatigue. Negative for fever.  HENT: Negative for congestion.   Eyes: Negative for blurred vision.  Respiratory: Positive for cough, sputum production and shortness of breath.   Cardiovascular: Negative for chest pain and leg swelling.  Gastrointestinal: Negative for abdominal pain, constipation, blood in stool and melena.  Genitourinary: Negative for dysuria.  Musculoskeletal: Negative for myalgias and falls.  Skin: Negative for rash.  Neurological: Positive for dizziness and weakness. Negative for headaches.       Was  having this, not at present  Endo/Heme/Allergies: Bruises/bleeds easily.  Psychiatric/Behavioral: Positive for depression. Negative for memory loss.    Past Medical History  Diagnosis Date  . Hypertension   . Cancer     skin  . Arthritis   . Low back pain syndrome     Joint Pain  . Emphysema   . S/P radiation therapy 07/23/10 - 09/14/10    Right Thigh for Leiomyosarcoma / 61.2 Gy / 34 Fractions  . Leiomyosarcoma of thigh 2011  . Respiratory failure, acute 3/276/15 -11/04/13     Hospitalized  . Depression   . COPD (chronic obstructive pulmonary disease)    Past Surgical History  Procedure Laterality Date  . Laminectomy  2008    Three back surgeries per patient medical history form dated 05/06/10.  Marland Kitchen Elbow surgery  1967    L  . Leg skin lesion  biopsy / excision  04/20/10 / re-excision 06/18/10     Right thigh then re-excision of Lymph node right \\grion  06/18/10  . Back surgery    . Elbow surgery Left    Social History:   reports that he has been smoking Cigarettes.  He has a 100 pack-year smoking history. He has never used smokeless tobacco. He reports that he drinks about 1.8 oz of alcohol per week. He reports that he does not use illicit drugs.  Family History  Problem Relation Age of Onset  . Heart disease Mother     heart attack    Medications: Patient's Medications  New Prescriptions   No  medications on file  Previous Medications   ALBUTEROL (PROAIR HFA) 108 (90 BASE) MCG/ACT INHALER    INHALE 2 PUFFS BY MOUTH EVERY 4 TO 6 HOURS AS NEEDED   ALPRAZOLAM (XANAX) 0.5 MG TABLET    Take 1 tablet twice daily as needed   ASPIRIN 81 MG TABLET    Take 1 tablet (81 mg total) by mouth daily.   BUDESONIDE (PULMICORT) 0.5 MG/2ML NEBULIZER SOLUTION    Take 2 mLs (0.5 mg total) by nebulization 2 (two) times daily.   BUSPIRONE (BUSPAR) 10 MG TABLET    Take 1 tablet (10 mg total) by mouth 2 (two) times daily.   GUAIFENESIN (MUCINEX) 600 MG 12 HR TABLET    Take 1 tablet (600 mg total)  by mouth 2 (two) times daily as needed.   IPRATROPIUM-ALBUTEROL (DUONEB) 0.5-2.5 (3) MG/3ML SOLN    Take 3 mLs by nebulization 4 (four) times daily. Can use an additional 2 treatments if needed.   MULTIPLE VITAMINS-MINERALS (CENTRUM SILVER PO)    Take 1 tablet by mouth daily.    PROAIR HFA 108 (90 BASE) MCG/ACT INHALER    INHALE 2 PUFFS BY MOUTH EVERY 4 TO 6 HOURS AS NEEDED   SERTRALINE (ZOLOFT) 25 MG TABLET    Take 1 tablet (25 mg total) by mouth daily.  Modified Medications   No medications on file  Discontinued Medications   No medications on file     Physical Exam: Filed Vitals:   07/02/14 1252  BP: 95/52  Pulse: 73  Temp: 97.4 F (36.3 C)  Resp: 20  Height: 5\' 8"  (1.727 m)  Weight: 99 lb (44.906 kg)  SpO2: 92%  Physical Exam  Constitutional: He is oriented to person, place, and time.  Cachectic white male, nad, is dypneic riding the exercise bike in therapy  HENT:  Head: Normocephalic and atraumatic.  Right Ear: External ear normal.  Left Ear: External ear normal.  Nose: Nose normal.  Mouth/Throat: Oropharynx is clear and moist.  Eyes: Conjunctivae and EOM are normal. Pupils are equal, round, and reactive to light.  Neck: Normal range of motion. Neck supple. No JVD present.  Cardiovascular: Normal rate, regular rhythm, normal heart sounds and intact distal pulses.   Pulmonary/Chest: He has wheezes.  Wearing O2  Abdominal: Soft. Bowel sounds are normal. He exhibits no distension and no mass. There is no tenderness.  Musculoskeletal: Normal range of motion.  Neurological: He is alert and oriented to person, place, and time.  Skin: Skin is warm and dry. There is pallor.  Psychiatric:  Anxious and tremulous    Labs reviewed: Basic Metabolic Panel:  Recent Labs  10/12/13 0210  06/24/14 1545 06/25/14 0900 06/26/14 0516  NA 135*  < > 128* 132* 138  K 3.7  < > 3.6* 3.7 3.9  CL 91*  < > 87* 90* 99  CO2 32  < > 29 29 30   GLUCOSE 121*  < > 156* 97 88  BUN 16  < >  19 15 12   CREATININE 0.54  < > 0.66 0.71 0.60  CALCIUM 9.0  < > 8.9 8.7 8.4  MG 2.0  --   --   --   --   PHOS 2.4  --   --   --   --   < > = values in this interval not displayed. Liver Function Tests:  Recent Labs  10/11/13 0845 10/12/13 0210 06/24/14 0527  AST 20 23 23   ALT 16 20 20  ALKPHOS 55 57 66  BILITOT 0.5 0.2* 0.9  PROT 6.5 5.8* 6.9  ALBUMIN 3.5 3.4* 4.1   No results for input(s): LIPASE, AMYLASE in the last 8760 hours. No results for input(s): AMMONIA in the last 8760 hours. CBC:  Recent Labs  11/01/13 0440  11/24/13 2345 06/24/14 0035 06/24/14 0041 06/24/14 0527  WBC 7.9  < > 8.4 7.4  --  5.7  NEUTROABS 5.7  --  6.5 5.0  --   --   HGB 13.1  < > 13.6 13.4 14.6 13.5  HCT 37.7*  < > 39.9 38.3* 43.0 38.5*  MCV 94.3  < > 95.0 91.4  --  91.4  PLT 183  < > 150 213  --  198  < > = values in this interval not displayed. Cardiac Enzymes:  Recent Labs  06/24/14 0527 06/24/14 1135 06/24/14 1545  TROPONINI <0.30 <0.30 <0.30   BNP: Invalid input(s): POCBNP CBG:  Recent Labs  11/25/13 0016  GLUCAP 104*    Imaging and Procedures: 06/24/14:  CXR:  COPD without acute abnormality.  Assessment/Plan 1. COPD exacerbation -completed doxy and prednisone, improving, working with therapy  2. Other emphysema -follows with Dr. Gwenette Greet, keep upcoming appt -cont O2 (used at home) -cont duonebs, will keep albuterol inhaler at bedside using facility protocol, pulmicort nebs  3. Acute respiratory failure with hypoxia -cont chronic O2 -work on pulmonary rehab with PT  4. Underweight outpatient low dose CT scan recommended due to weight loss and longstanding smoking--xrays have been negative, h/o leiomyosarcoma of right thigh  5. Protein-calorie malnutrition, severe -protein supplements, cont to eat like he's been since he adjusted to being here (didn't eat well the first several days due to severe anxiety)  6. TOBACCO USER -has not smoked now in a couple of  mos--would favor not restarting nicotine in any way at this point if he can tolerate it  7. Anxiety -cont buspar, prn xanax, monitor BP which had been running low  8. Alcohol use -upon d/c, will need AA referral and information on any additional counseling he may need  Functional status:  Is indepependent in adls, but easily dyspneic and remains weak  Family/ staff Communication: seen with unit supervisor  Labs/tests ordered:  BMP in a week

## 2014-07-11 ENCOUNTER — Ambulatory Visit: Payer: Medicare Other | Admitting: Family Medicine

## 2014-07-12 ENCOUNTER — Non-Acute Institutional Stay (SKILLED_NURSING_FACILITY): Payer: Medicare Other | Admitting: Adult Health

## 2014-07-12 ENCOUNTER — Ambulatory Visit (INDEPENDENT_AMBULATORY_CARE_PROVIDER_SITE_OTHER): Payer: Medicare Other | Admitting: Pulmonary Disease

## 2014-07-12 ENCOUNTER — Encounter: Payer: Self-pay | Admitting: Pulmonary Disease

## 2014-07-12 VITALS — BP 124/64 | HR 85 | Temp 98.1°F | Ht 69.0 in | Wt 107.0 lb

## 2014-07-12 DIAGNOSIS — E43 Unspecified severe protein-calorie malnutrition: Secondary | ICD-10-CM

## 2014-07-12 DIAGNOSIS — J438 Other emphysema: Secondary | ICD-10-CM

## 2014-07-12 DIAGNOSIS — J441 Chronic obstructive pulmonary disease with (acute) exacerbation: Secondary | ICD-10-CM

## 2014-07-12 DIAGNOSIS — I10 Essential (primary) hypertension: Secondary | ICD-10-CM

## 2014-07-12 NOTE — Assessment & Plan Note (Addendum)
The patient has had a recent acute exacerbation approximately 2 weeks ago that required hospitalization, and currently he is in a rehabilitation facility. Has actually stopped smoking, and feels that his breathing is much improved.  I have asked him to continue on his bronchodilator regimen, as well as oxygen, and to follow-up with me again in approximately 8 weeks. I stressed to him again the importance of staying completely off cigarettes.

## 2014-07-12 NOTE — Patient Instructions (Signed)
No change in medications. Congratulations on stopping smoking. Hopefully we will see gradual improvement over time. followup with me again in 8 weeks to check on progress.

## 2014-07-12 NOTE — Progress Notes (Signed)
   Subjective:    Patient ID: Hector House, male    DOB: January 22, 1946, 68 y.o.   MRN: [id number]  HPI Patient comes in today for follow-up of his known severe COPD. He was in the hospital approximately 3 weeks ago with an acute exacerbation, and currently is residing in a rehabilitation facility. He feels that he is much improved, and has not smoked since admission to the hospital. He is continuing on his bronchodilator regimen, as well as his oxygen. He has a minimal cough and only scant discolored mucus.   Review of Systems  Constitutional: Negative for fever and unexpected weight change.  HENT: Positive for congestion. Negative for dental problem, ear pain, nosebleeds, postnasal drip, rhinorrhea, sinus pressure, sneezing, sore throat and trouble swallowing.   Eyes: Negative for redness and itching.  Respiratory: Positive for cough, chest tightness, shortness of breath and wheezing.   Cardiovascular: Negative for palpitations and leg swelling.  Gastrointestinal: Negative for nausea and vomiting.  Genitourinary: Negative for dysuria.  Musculoskeletal: Negative for joint swelling.  Skin: Negative for rash.  Neurological: Negative for headaches.  Hematological: Does not bruise/bleed easily.  Psychiatric/Behavioral: Negative for dysphoric mood. The patient is not nervous/anxious.        Objective:   Physical Exam Thin male in no acute distress Nose without purulence or discharge noted Neck without lymphadenopathy or thyromegaly Chest with very diminished breath sounds, no active wheezing Cardiac exam with distant heart sounds, but sounds regular Lower extremities with no significant edema, no cyanosis Alert and oriented, moves all 4 extremities.       Assessment & Plan:

## 2014-07-17 ENCOUNTER — Encounter: Payer: Self-pay | Admitting: Family Medicine

## 2014-07-17 ENCOUNTER — Ambulatory Visit (INDEPENDENT_AMBULATORY_CARE_PROVIDER_SITE_OTHER): Payer: Medicare Other | Admitting: Family Medicine

## 2014-07-17 VITALS — BP 123/75 | HR 100 | Ht 69.0 in | Wt 101.0 lb

## 2014-07-17 DIAGNOSIS — E43 Unspecified severe protein-calorie malnutrition: Secondary | ICD-10-CM

## 2014-07-17 DIAGNOSIS — J439 Emphysema, unspecified: Secondary | ICD-10-CM

## 2014-07-17 DIAGNOSIS — F419 Anxiety disorder, unspecified: Secondary | ICD-10-CM

## 2014-07-17 DIAGNOSIS — Z122 Encounter for screening for malignant neoplasm of respiratory organs: Secondary | ICD-10-CM

## 2014-07-17 MED ORDER — SERTRALINE HCL 50 MG PO TABS: 50.0000 mg | ORAL_TABLET | Freq: Every day | ORAL | Status: DC

## 2014-07-17 MED ORDER — NICOTINE 21 MG/24HR TD PT24: 21.0000 mg | MEDICATED_PATCH | Freq: Every day | TRANSDERMAL | Status: DC

## 2014-07-17 NOTE — Assessment & Plan Note (Signed)
Patient applying for meals on wheels. Sister states she is not around to help cook for him. Suspect patient to improve once he improves calorie intake.

## 2014-07-17 NOTE — Progress Notes (Signed)
    Subjective    Hector House is a 68 y.o. male that presents for an office visit.   1. Protein-calorie malnutrition/weight loss: Patient gained from 94-107lbs. Quit smoking 6 weeks. Concern for possible malignancy. CT scan in 2011 without evidence of pulmonary nodule.  2. Severe COPD: Using albuterol, Duonebs and Pulmicort. Using albuterol 2-3 times per day and has not increased in use. Breathing is usually manageable. His anxiety and anger can make his breathing worse. Cough with sputum production which is chronic. States it is harder in the morning but that he will be getting his hospital bed soon and suspects things will be easier for him at that time. Afebrile. Uses oxygen at home but is waiting for his machine.  3. Anxiety/Depression: has feelings of "giving up" because he can't do things like he used to. (can't wash car, or moe grass). Feels useless. States he is getting used to not doing the things he used to do and that his function has improved. GAD-7 score: 16. PHQ-9 score: 14  History  Substance Use Topics  . Smoking status: Former Smoker -- 2.00 packs/day for 50 years    Types: Cigarettes    Quit date: 06/12/2014  . Smokeless tobacco: Never Used  . Alcohol Use: 1.8 oz/week    3 Cans of beer per week     Comment: Consumption per week.    No Known Allergies  No orders of the defined types were placed in this encounter.    ROS  Per HPI   Objective   BP 123/75 mmHg  Pulse 100  Ht 5\' 9"  (1.753 m)  Wt 101 lb (45.813 kg)  BMI 14.91 kg/m2  SpO2 98%  General: Fair appearing male, no distress Respiratory/Chest: Clear to auscultation bilaterally, no wheezing Neuro: Normal gait  Assessment and Plan   Please refer to problem based charting of assessment and plan

## 2014-07-17 NOTE — Patient Instructions (Addendum)
Thank you for coming to see me today. It was a pleasure. Today we talked about:   Anxiety/Depression: I will increase your Zoloft to 50mg  daily.  Lung cancer screening: I will have you get a low-dose CT scan  COPD: You seem to be stable. I will not make any changes since Dr. Gwenette Greet is managing.   Please make an appointment to see me in 4-6 weeks for a follow-up.  If you have any questions or concerns, please do not hesitate to call the office at [phone].  Sincerely,  [name], MD

## 2014-07-17 NOTE — Assessment & Plan Note (Signed)
Patient states he was restarted on Xanax  BID which has helped a little bit. Patient opened to therapy but would like to try increased dose of Zoloft first. Will increase to 50mg  daily.

## 2014-07-17 NOTE — Assessment & Plan Note (Addendum)
Patient stable today. No changes to medications. Will obtain low-dose CT scan since patient is 69 with a 100 pack year history. No current symptoms.

## 2014-07-18 ENCOUNTER — Encounter (HOSPITAL_COMMUNITY): Payer: Self-pay | Admitting: Cardiovascular Disease

## 2014-07-22 ENCOUNTER — Telehealth: Payer: Self-pay | Admitting: Family Medicine

## 2014-07-22 NOTE — Telephone Encounter (Signed)
Physical therapist with [facility] evualated pt. Requests verbal orders to see the pt 2 times per week for 4 wks

## 2014-07-24 NOTE — Telephone Encounter (Signed)
LVM for Hector House to call back to give verbal orders per Dr. Lonny Prude

## 2014-07-25 ENCOUNTER — Other Ambulatory Visit: Payer: Self-pay | Admitting: Adult Health

## 2014-07-25 NOTE — Telephone Encounter (Signed)
Spoke with Camelyn and gave verbal OK

## 2014-07-26 ENCOUNTER — Telehealth: Payer: Self-pay | Admitting: *Deleted

## 2014-07-26 NOTE — Telephone Encounter (Signed)
[facility] called stating pt did not show for his CT scan appt today.  Derl Barrow, RN

## 2014-07-31 NOTE — Progress Notes (Signed)
Patient ID: Hector House, male   DOB: 06-17-1946, 68 y.o.   MRN: [id number]  [facility] living     No Known Allergies     Chief Complaint  Patient presents with  . Discharge Note    HPI:  He is being discharged to home with home health for pt and nursing to improve upon his gait strength and for medication management and lung program. He will not need dme. He will need his prescriptions to be written and will need a follow up appoint with his pcp.  He had been hospitalized for an acute exacerbation of copd.    Past Medical History  Diagnosis Date  . Hypertension   . Cancer     skin  . Arthritis   . Low back pain syndrome     Joint Pain  . Emphysema   . S/P radiation therapy 07/23/10 - 09/14/10    Right Thigh for Leiomyosarcoma / 61.2 Gy / 34 Fractions  . Leiomyosarcoma of thigh 2011  . Respiratory failure, acute 3/276/15 -11/04/13     Hospitalized  . Depression   . COPD (chronic obstructive pulmonary disease)     Past Surgical History  Procedure Laterality Date  . Laminectomy  2008    Three back surgeries per patient medical history form dated 05/06/10.  Marland Kitchen Elbow surgery  1967    L  . Leg skin lesion  biopsy / excision  04/20/10 / re-excision 06/18/10     Right thigh then re-excision of Lymph node right \\grion  06/18/10  . Back surgery    . Elbow surgery Left   . Left heart catheterization with coronary angiogram N/A 11/02/2013    Procedure: LEFT HEART CATHETERIZATION WITH CORONARY ANGIOGRAM;  Surgeon: Hector Sine, MD;  Location: [facility] CATH LAB;  Service: Cardiovascular;  Laterality: N/A;    VITAL SIGNS BP 119/72 mmHg  Pulse 80  Ht 5\' 8"  (1.727 m)  Wt 101 lb (45.813 kg)  BMI 15.36 kg/m2  SpO2 94%   Outpatient Encounter Prescriptions as of 07/12/2014  Medication Sig  . albuterol (PROAIR HFA) 108 (90 BASE) MCG/ACT inhaler INHALE 2 PUFFS BY MOUTH EVERY 4 TO 6 HOURS AS NEEDED (Patient taking differently: Inhale 2 puffs into the lungs every 4 (four) hours as needed for  wheezing or shortness of breath. )  . ALPRAZolam (XANAX) 0.5 MG tablet Take 1 tablet twice daily as needed  . aspirin 81 MG tablet Take 1 tablet (81 mg total) by mouth daily.  . budesonide (PULMICORT) 0.5 MG/2ML nebulizer solution Take 2 mLs (0.5 mg total) by nebulization 2 (two) times daily.  . busPIRone (BUSPAR) 10 MG tablet Take 1 tablet (10 mg total) by mouth 2 (two) times daily.  Marland Kitchen guaiFENesin (MUCINEX) 600 MG 12 hr tablet Take 1 tablet (600 mg total) by mouth 2 (two) times daily as needed. (Patient taking differently: Take 600 mg by mouth 2 (two) times daily as needed for cough or to loosen House. )  . ipratropium-albuterol (DUONEB) 0.5-2.5 (3) MG/3ML SOLN Take 3 mLs by nebulization 4 (four) times daily. Can use an additional 2 treatments if needed.  . Multiple Vitamins-Minerals (CENTRUM SILVER PO) Take 1 tablet by mouth daily.   . naproxen sodium (ANAPROX) 220 MG tablet Take 220 mg by mouth as needed.     SIGNIFICANT DIAGNOSTIC EXAMS  LABS REVIEWED:   07-08-14: glucose 71; bun 11; creat 0.55; k+3.8; na++138     Review of Systems  Constitutional: Negative for malaise/fatigue.  Respiratory: Negative  for cough and shortness of breath.   Cardiovascular: Negative for chest pain and palpitations.  Gastrointestinal: Negative for abdominal pain and constipation.  Musculoskeletal: Negative for myalgias and joint pain.  Skin: Negative.   Psychiatric/Behavioral: Negative for depression. The patient is not nervous/anxious.      Physical Exam  Constitutional: He is oriented to person, place, and time. No distress.  Frail   Neck: Neck supple. No JVD present.  Cardiovascular: Normal rate, regular rhythm and intact distal pulses.   Respiratory: Effort normal and breath sounds normal. No respiratory distress.  GI: Soft. Bowel sounds are normal. He exhibits no distension. There is no tenderness.  Musculoskeletal: Normal range of motion. He exhibits no edema.  Neurological: He is alert  and oriented to person, place, and time.  Skin: Skin is warm and dry. He is not diaphoretic.       ASSESSMENT/ PLAN:  Will discharge to home with home health for pt/nursing to improve gait and strength; medication management and lung program. His prescriptions have ben written for a 30 days supply of his medications with #30 xanax 0.5 mg tabs. He will not need dme. He has a follow up with his pcp Hector House on 07-17-14 at 8:30 am.   Time spent with patient 40 minutes.   Hector Edwards NP [facility]  Contact [phone] Monday through Friday 8am- 5pm  After hours call [phone]

## 2014-08-01 ENCOUNTER — Encounter (HOSPITAL_COMMUNITY): Payer: Self-pay | Admitting: Emergency Medicine

## 2014-08-01 ENCOUNTER — Telehealth: Payer: Self-pay | Admitting: Family Medicine

## 2014-08-01 DIAGNOSIS — I10 Essential (primary) hypertension: Secondary | ICD-10-CM | POA: Insufficient documentation

## 2014-08-01 DIAGNOSIS — I959 Hypotension, unspecified: Secondary | ICD-10-CM | POA: Insufficient documentation

## 2014-08-01 DIAGNOSIS — J449 Chronic obstructive pulmonary disease, unspecified: Secondary | ICD-10-CM | POA: Insufficient documentation

## 2014-08-01 NOTE — Telephone Encounter (Signed)
Home health nurse calls stating that patient is at home and his blood pressure is 76/60. Pulse is 80. O2 is on 4L and patient is sating at 93%. No CP or dyspnea. Activity level is the same. BPs have been in the 110's/70's recently. I advised with a blood pressure that low that the patient should be evaluated in the ED. The home health nurse stated understanding and notes that he will need to go back to the patients house as the patients phone is not working to inform the patient of this.   Tommi Rumps, MD

## 2014-08-01 NOTE — ED Notes (Signed)
Patient states his home health nurse sent him to the ED with hypotension.   Patient states he has no idea what his blood pressure usually runs, and has no idea what it was when the nurse checked it.   Patient states "I feel fine.  I don't really know why I am here".   Patient states he was supposed to see Dr. Gabrielle Dare and that he would be waiting on him.   ??

## 2014-08-05 ENCOUNTER — Other Ambulatory Visit: Payer: Self-pay | Admitting: Adult Health

## 2014-08-06 ENCOUNTER — Other Ambulatory Visit: Payer: Self-pay | Admitting: Family Medicine

## 2014-08-06 NOTE — Telephone Encounter (Signed)
Pt is needing a refill on his Xanax called in to [facility] on [address]. The pharmacy has been faxing [place] since Saturday 12/26. His home health nurse from [place] is hoping that we can get this called in right away. If you have any question you can call her at [phone]. jw

## 2014-08-07 ENCOUNTER — Other Ambulatory Visit: Payer: Self-pay | Admitting: Family Medicine

## 2014-08-12 ENCOUNTER — Other Ambulatory Visit: Payer: Self-pay | Admitting: Adult Health

## 2014-08-12 MED ORDER — ALPRAZOLAM 0.5 MG PO TABS: ORAL_TABLET | ORAL | Status: DC

## 2014-08-12 NOTE — Telephone Encounter (Signed)
Can you please sign off on this as a phone in.

## 2014-08-24 ENCOUNTER — Other Ambulatory Visit: Payer: Self-pay | Admitting: Adult Health

## 2014-08-28 ENCOUNTER — Telehealth: Payer: Self-pay | Admitting: Pulmonary Disease

## 2014-08-28 NOTE — Telephone Encounter (Signed)
Called and  Spoke with pts sister and she is aware of the medications that the pt is taking from [facility].  She stated that everytime he is in the hospital or in rehab they change his medications and he gets confused about his medications.  She is aware of medications and nothing further is needed.

## 2014-09-03 ENCOUNTER — Other Ambulatory Visit: Payer: Self-pay | Admitting: Family Medicine

## 2014-09-03 ENCOUNTER — Other Ambulatory Visit: Payer: Self-pay | Admitting: Adult Health

## 2014-09-05 ENCOUNTER — Telehealth: Payer: Self-pay | Admitting: *Deleted

## 2014-09-05 NOTE — Telephone Encounter (Signed)
-----   Message from [name], MD sent at 09/04/2014  3:22 PM EST ----- Please inform patient that his prescription is ready for pick up and will not be filled before 09/09/2014

## 2014-09-05 NOTE — Telephone Encounter (Signed)
LVM for pt to return call to inform him of below. Katharina Caper, Carson Bogden D

## 2014-09-06 ENCOUNTER — Ambulatory Visit (INDEPENDENT_AMBULATORY_CARE_PROVIDER_SITE_OTHER): Payer: Medicare Other | Admitting: Pulmonary Disease

## 2014-09-06 ENCOUNTER — Encounter: Payer: Self-pay | Admitting: Pulmonary Disease

## 2014-09-06 ENCOUNTER — Other Ambulatory Visit: Payer: Self-pay | Admitting: Family Medicine

## 2014-09-06 VITALS — BP 90/60 | HR 108 | Temp 97.1°F | Ht 69.0 in | Wt 107.4 lb

## 2014-09-06 DIAGNOSIS — J438 Other emphysema: Secondary | ICD-10-CM

## 2014-09-06 NOTE — Progress Notes (Signed)
   Subjective:    Patient ID: Hector House, male    DOB: [birth year], 69 y.o.   MRN: [id number]  HPI The patient comes in today for follow-up of his known severe COPD. He has continued to stay away from cigarettes, and feels that his cough and mucus production has decreased significantly. He is staying on his bronchodilator regimen, and has not had a recent acute exacerbation. He continues to be. Short of breath with activity, but I have explained that he has severe lung disease.   Review of Systems  Constitutional: Negative.  Negative for fever and unexpected weight change.  HENT: Positive for congestion. Negative for dental problem, drooling, ear discharge, ear pain, facial swelling, hearing loss, nosebleeds, postnasal drip, rhinorrhea, sinus pressure, sneezing, sore throat, trouble swallowing and voice change.   Eyes: Negative.  Negative for redness and itching.  Respiratory: Positive for cough, shortness of breath and wheezing. Negative for chest tightness.   Cardiovascular: Negative.  Negative for palpitations and leg swelling.  Gastrointestinal: Negative.  Negative for nausea, vomiting and abdominal distention.  Endocrine: Negative.   Genitourinary: Negative.  Negative for dysuria.  Musculoskeletal: Negative.  Negative for joint swelling.  Skin: Negative.  Negative for rash.  Allergic/Immunologic: Positive for environmental allergies.  Neurological: Positive for dizziness and light-headedness. Negative for tremors, seizures, syncope, speech difficulty, weakness, numbness and headaches.  Hematological: Negative.  Does not bruise/bleed easily.  Psychiatric/Behavioral: Negative.  Negative for dysphoric mood. The patient is not nervous/anxious.        Objective:   Physical Exam Cachectic appearing male in no acute distress Nose without purulence or discharge noted Neck without lymphadenopathy or thyromegaly Chest with very diminished breath sounds, no active wheezing Cardiac exam with  regular rate and rhythm Lower extremities without edema, no cyanosis Alert and oriented, moves all 4 extremities.       Assessment & Plan:

## 2014-09-06 NOTE — Assessment & Plan Note (Signed)
The patient is maintaining a stable baseline from a COPD standpoint. Continues to stay away from cigarettes, and I have congratulated him on this. He feels that his cough and mucus has decreased since not smoking. He continues to have significant dyspnea on exertion, and I explained that he has very severe lung disease based on his breathing studies. The goal will be to maximize quality of life is much as possible. I've asked him to stay on his current medications, and to try and increase his caloric intake is much as possible.

## 2014-09-06 NOTE — Patient Instructions (Signed)
No change in medications Push calories to help build muscle mass Stay as active as possible followup with me again in 65mos.

## 2014-09-20 ENCOUNTER — Other Ambulatory Visit: Payer: Self-pay | Admitting: Family Medicine

## 2014-10-11 ENCOUNTER — Ambulatory Visit (INDEPENDENT_AMBULATORY_CARE_PROVIDER_SITE_OTHER): Payer: Medicare Other | Admitting: Family Medicine

## 2014-10-11 ENCOUNTER — Encounter: Payer: Self-pay | Admitting: Family Medicine

## 2014-10-11 VITALS — BP 116/93 | HR 109 | Temp 97.6°F | Ht 69.0 in | Wt 110.9 lb

## 2014-10-11 DIAGNOSIS — F419 Anxiety disorder, unspecified: Secondary | ICD-10-CM

## 2014-10-11 DIAGNOSIS — J438 Other emphysema: Secondary | ICD-10-CM

## 2014-10-11 DIAGNOSIS — I10 Essential (primary) hypertension: Secondary | ICD-10-CM

## 2014-10-11 MED ORDER — BUSPIRONE HCL 10 MG PO TABS: 10.0000 mg | ORAL_TABLET | Freq: Two times a day (BID) | ORAL | Status: AC

## 2014-10-11 NOTE — Patient Instructions (Addendum)
Thank you for coming to see me today. It was a pleasure. Today we talked about:   Anxiety: I am restarting your Buspirone. Please take this along with your Zoloft and Xanax. The plan is to eventually get you off of Xanax.  High blood pressure: your blood pressure was mildly elevated today. We will not make changes today, though  COPD: I am refilling your albuterol inhaler  Please make an appointment to get fasting lab work  Please make an appointment to see me in one month for physical exam.  If you have any questions or concerns, please do not hesitate to call the office at [phone].  Sincerely,  [name], MD

## 2014-10-11 NOTE — Progress Notes (Signed)
    Subjective    Hector House is a 69 y.o. male that presents for an office visit.   1. Anxiety: Anxiousness improved with Xanax. States he is less cranky as well and more pleasing to be around  2. Hypertension: Adherent with lisinopril-hctz. No headaches or chest pain. Has baseline shortness of breath with COPD. No acute SOB.  3. COPD: using albuterol inhaler 1-2 times per day. Uses nebulizer about 3 times per day. Uses 3L O2 daily. Exercise tolerance has remained stable.  History  Substance Use Topics  . Smoking status: Former Smoker -- 2.00 packs/day for 50 years    Types: Cigarettes    Quit date: 06/12/2014  . Smokeless tobacco: Never Used  . Alcohol Use: 1.8 oz/week    3 Cans of beer per week     Comment: Consumption per week.    No Known Allergies  No orders of the defined types were placed in this encounter.    ROS  Per HPI   Objective   BP 116/93 mmHg  Pulse 109  Temp(Src) 97.6 F (36.4 C) (Oral)  Ht 5\' 9"  (1.753 m)  Wt 110 lb 14.4 oz (50.304 kg)  BMI 16.37 kg/m2  General: Thin appearing, no distress Respiratory/Chest: Diminished breath sounds diffusely, no wheezing, no accessory muscle usage Cardiovascular: Regular rate and rhythm Psych: does not appear anxious  Assessment and Plan   Please refer to problem based charting of assessment and plan

## 2014-10-15 NOTE — Assessment & Plan Note (Signed)
Well controlled without medications, however today, diastolic is slightly elevated. No symptoms  No changes to management

## 2014-10-16 NOTE — Assessment & Plan Note (Signed)
Patient appears stable. He is using oxygen daily and is currently at 3L. Currently managed by pulmonology. Requires refill on albuterol  Refill albuterol  Follow-up with pulmonologist

## 2014-10-16 NOTE — Assessment & Plan Note (Signed)
Well controlled with xanax.  Discussed eventually switching to another benzodiazepine and hopefully coming off altogether  Refilled Xanax

## 2014-11-11 ENCOUNTER — Other Ambulatory Visit: Payer: Self-pay | Admitting: Family Medicine

## 2014-11-19 ENCOUNTER — Encounter: Payer: Self-pay | Admitting: Gastroenterology

## 2014-11-27 ENCOUNTER — Other Ambulatory Visit: Payer: Self-pay | Admitting: Family Medicine

## 2014-12-02 ENCOUNTER — Telehealth: Payer: Self-pay | Admitting: Pulmonary Disease

## 2014-12-02 NOTE — Telephone Encounter (Signed)
[place] are you okay with this refill? The last time it was refilled was by another provider not in our office. Thanks.

## 2014-12-02 NOTE — Telephone Encounter (Signed)
Spoke with Occupational hygienist at [facility]. Gave her the verbal for pt's refill. Levander Campion will be faxing over a prescription for [place] to sign. This will be faxed to Triage.

## 2014-12-02 NOTE — Telephone Encounter (Signed)
Absolutely.

## 2014-12-09 ENCOUNTER — Other Ambulatory Visit: Payer: Self-pay | Admitting: Family Medicine

## 2014-12-09 ENCOUNTER — Telehealth: Payer: Self-pay | Admitting: Family Medicine

## 2014-12-09 NOTE — Telephone Encounter (Signed)
Family Medicine After hours phone call  Received phone call from [phone] from third party that was relaying information about patient: pt has history of anxiety attacks and has been having one all day. He normally takes xanax but ran out and had no refills at the pharmacy. He says that he feels like he cannot catch his breath. I discussed that as I was not the PCP I would not fill controlled substances over the phone, that if he is unable to catch his breath or able to calm himself down he would need to go to the ED to be evaluated. Recommended in the future that if he needs refills he should contact the clinic before he runs out. Same day appt was made for tomorrow morning with Dr. Venetia Maxon. FYI note sent to both Dr. Venetia Maxon and PCP.  Tawanna Sat, MD 12/09/2014, 7:32 PM PGY-2, [place]

## 2014-12-10 ENCOUNTER — Inpatient Hospital Stay (HOSPITAL_COMMUNITY)
Admission: EM | Admit: 2014-12-10 | Discharge: 2014-12-11 | DRG: 191 | Disposition: A | Discharge status: Home Health | Payer: Medicare Other | Attending: Family Medicine | Admitting: Family Medicine

## 2014-12-10 ENCOUNTER — Emergency Department (HOSPITAL_COMMUNITY): Payer: Medicare Other

## 2014-12-10 ENCOUNTER — Encounter: Payer: Self-pay | Admitting: *Deleted

## 2014-12-10 ENCOUNTER — Other Ambulatory Visit: Payer: Self-pay | Admitting: *Deleted

## 2014-12-10 ENCOUNTER — Encounter (HOSPITAL_COMMUNITY): Payer: Self-pay | Admitting: Emergency Medicine

## 2014-12-10 DIAGNOSIS — Z923 Personal history of irradiation: Secondary | ICD-10-CM

## 2014-12-10 DIAGNOSIS — F329 Major depressive disorder, single episode, unspecified: Secondary | ICD-10-CM | POA: Diagnosis not present

## 2014-12-10 DIAGNOSIS — Z8589 Personal history of malignant neoplasm of other organs and systems: Secondary | ICD-10-CM

## 2014-12-10 DIAGNOSIS — E876 Hypokalemia: Secondary | ICD-10-CM | POA: Diagnosis present

## 2014-12-10 DIAGNOSIS — F419 Anxiety disorder, unspecified: Secondary | ICD-10-CM | POA: Diagnosis not present

## 2014-12-10 DIAGNOSIS — Z7982 Long term (current) use of aspirin: Secondary | ICD-10-CM | POA: Diagnosis not present

## 2014-12-10 DIAGNOSIS — J9620 Acute and chronic respiratory failure, unspecified whether with hypoxia or hypercapnia: Secondary | ICD-10-CM

## 2014-12-10 DIAGNOSIS — R634 Abnormal weight loss: Secondary | ICD-10-CM | POA: Diagnosis present

## 2014-12-10 DIAGNOSIS — F17210 Nicotine dependence, cigarettes, uncomplicated: Secondary | ICD-10-CM | POA: Diagnosis not present

## 2014-12-10 DIAGNOSIS — R64 Cachexia: Secondary | ICD-10-CM | POA: Diagnosis not present

## 2014-12-10 DIAGNOSIS — R0602 Shortness of breath: Secondary | ICD-10-CM | POA: Diagnosis present

## 2014-12-10 DIAGNOSIS — Z681 Body mass index (BMI) 19 or less, adult: Secondary | ICD-10-CM | POA: Diagnosis not present

## 2014-12-10 DIAGNOSIS — I10 Essential (primary) hypertension: Secondary | ICD-10-CM | POA: Diagnosis not present

## 2014-12-10 DIAGNOSIS — J439 Emphysema, unspecified: Secondary | ICD-10-CM | POA: Diagnosis present

## 2014-12-10 DIAGNOSIS — J441 Chronic obstructive pulmonary disease with (acute) exacerbation: Secondary | ICD-10-CM | POA: Insufficient documentation

## 2014-12-10 DIAGNOSIS — F064 Anxiety disorder due to known physiological condition: Secondary | ICD-10-CM | POA: Diagnosis present

## 2014-12-10 DIAGNOSIS — E43 Unspecified severe protein-calorie malnutrition: Secondary | ICD-10-CM | POA: Diagnosis present

## 2014-12-10 DIAGNOSIS — R262 Difficulty in walking, not elsewhere classified: Secondary | ICD-10-CM | POA: Diagnosis present

## 2014-12-10 DIAGNOSIS — J9622 Acute and chronic respiratory failure with hypercapnia: Secondary | ICD-10-CM

## 2014-12-10 LAB — CBC WITH DIFFERENTIAL/PLATELET
BASOS ABS: 0 10*3/uL (ref 0.0–0.1)
Basophils Relative: 0 % (ref 0–1)
Eosinophils Absolute: 0 10*3/uL (ref 0.0–0.7)
Eosinophils Relative: 1 % (ref 0–5)
HCT: 43.9 % (ref 39.0–52.0)
Hemoglobin: 14.4 g/dL (ref 13.0–17.0)
Lymphocytes Relative: 14 % (ref 12–46)
Lymphs Abs: 1 10*3/uL (ref 0.7–4.0)
MCH: 30.7 pg (ref 26.0–34.0)
MCHC: 32.8 g/dL (ref 30.0–36.0)
MCV: 93.6 fL (ref 78.0–100.0)
Monocytes Absolute: 0.4 10*3/uL (ref 0.1–1.0)
Monocytes Relative: 6 % (ref 3–12)
NEUTROS ABS: 5.5 10*3/uL (ref 1.7–7.7)
NEUTROS PCT: 79 % — AB (ref 43–77)
PLATELETS: 176 10*3/uL (ref 150–400)
RBC: 4.69 MIL/uL (ref 4.22–5.81)
RDW: 12.8 % (ref 11.5–15.5)
WBC: 6.9 10*3/uL (ref 4.0–10.5)

## 2014-12-10 LAB — BASIC METABOLIC PANEL
Anion gap: 14 (ref 5–15)
BUN: 11 mg/dL (ref 6–20)
CO2: 37 mmol/L — ABNORMAL HIGH (ref 22–32)
CREATININE: 0.93 mg/dL (ref 0.61–1.24)
Calcium: 9.2 mg/dL (ref 8.9–10.3)
Chloride: 86 mmol/L — ABNORMAL LOW (ref 101–111)
GFR calc non Af Amer: >60 mL/min (ref >60)
GLUCOSE: 127 mg/dL — AB (ref 70–99)
POTASSIUM: 2.9 mmol/L — AB (ref 3.5–5.1)
Sodium: 137 mmol/L (ref 135–145)

## 2014-12-10 LAB — I-STAT VENOUS BLOOD GAS, ED
Acid-Base Excess: 11 mmol/L — ABNORMAL HIGH (ref 0.0–2.0)
BICARBONATE: 40.5 meq/L — AB (ref 20.0–24.0)
O2 SAT: 16 %
TCO2: 43 mmol/L (ref 0–100)
pCO2, Ven: 73.3 mmHg (ref 45.0–50.0)
pH, Ven: 7.35 — ABNORMAL HIGH (ref 7.250–7.300)
pO2, Ven: 15 mmHg — CL (ref 30.0–45.0)

## 2014-12-10 MED ORDER — IPRATROPIUM-ALBUTEROL 0.5-2.5 (3) MG/3ML IN SOLN
3.0000 mL | Freq: Once | RESPIRATORY_TRACT | Status: AC
  Administered 2014-12-10: 3 mL via RESPIRATORY_TRACT
  Filled 2014-12-10: qty 3

## 2014-12-10 MED ORDER — POTASSIUM CHLORIDE CRYS ER 20 MEQ PO TBCR
20.0000 meq | EXTENDED_RELEASE_TABLET | Freq: Once | ORAL | Status: AC
  Administered 2014-12-10: 20 meq via ORAL
  Filled 2014-12-10: qty 1

## 2014-12-10 MED ORDER — PREDNISONE 50 MG PO TABS: ORAL_TABLET | ORAL | Status: DC
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
## 2014-12-10 NOTE — Progress Notes (Signed)
Pt in nurse clinic stating he could not sleep and SOB.  Pt stated he ran out of Xanax and Zoloft.   Pt stated the Xanax help with relaxing, sleeping and breathing.  He has been up since the night before.  Verbal order given by Dr. Nori Riis to call in Xanax 0.5 mg #60, No refills 1 tablet by mouth 2 times daily.  Appt with PCP made for 12/20/14 at 2:30 PM.  Pt need refill on Zoloft.  Derl Barrow, RN

## 2014-12-10 NOTE — ED Notes (Signed)
MD at bedside. 

## 2014-12-10 NOTE — H&P (Signed)
[facility] Admission History and Physical Service Pager: [phone]  Patient name: Hector House Medical record number: [id number] Date of birth: 1945-08-28 Age: 69 y.o. Gender: male  Primary Care Provider: [name], MD Consultants: None Code Status: Full  Chief Complaint: COPD exacerbation  Assessment and Plan: Hector House is a 69 y.o. male presenting with worsening shortness of breath since 4 pm this afternoon. PMH is significant for COPD with ephysema, tobacco use  Acute COPD exacerbation: S/p solumedrol x1 and albuterol x1 in the ED with improvement of symptoms to baseline with baseline Oxygen requirement. Initially placed on 5L O2 and and taken down to baseline 3L with good oxygen sats.  Anxiety likely playing a significant role. No infectious symptoms or increased O2 requirement at this time.  - Resume home budesonide - Duonebs q6, albuterol q2hr PRN. Will hold antibiotics at this time given that patient appears to be at baseline. - Solumedrol in the ED; Prednisone 50 mg daily starting 5/4.  - Cont home [place] O2 3L  Anxiety/Depression- Pt feels his anxiety has largely prompted his admission, as he is out of his home Xanax - Home Xanax as needed - Buspirone 10 mg - Zoloft 50 mg Daily  Hypokalemia- K 2.9 on admission,  likely in setting of albuterol administration - kdur 48mEq x2 - f/u BMET in AM  Tobacco Use - continues to smoke 1 ppd - Nicotine patch.  HTN: Soft BP in the ED to 90s/50s - Will hold home lisinopril-HCTZ at this time - closely monitor BPs   Disposition: Admit to [facility] Medicine Teaching service attending Dr. McDiarmid; Planning for D/C   History of Present Illness: Hector House is a 69 y.o. male presenting with worsening shortness of breath that started this afternoon in the setting of increasing anxiety. He is out of the Xanax that he takes for anxiety and felt that if he had this medication at home he would not have felt the  need to come to the hospital. When he gets anxious he has shortness of breath associated. When he was anxious this afternoon and found he was out of his homeXanax (he states it was taken by someone), he then called EMS to take him here. He denies chest pain, nausea/vomitting. He does have an occasional cough with sputum which is not new and denies fevers/chills.  No reports of chest pain, nausea, vomiting.   He received solumedrol and albuterol from EMS then additional albuterol in the ED. He has been able to maintain 100% O2 saturations on 4L supplemental O2. He has a 3L oxygen requirement at home at baseline.   Review Of Systems: Per HPI otherwise 12 point review of systems was performed and was unremarkable.  Patient Active Problem List   Diagnosis Date Noted  . Underweight 06/24/2014  . Protein-calorie malnutrition, severe 11/02/2013  . Anxiety 11/01/2013  . 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 COPD exacerbation 06/20/2012  . History of sarcoma of soft tissue 09/20/2011  . 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 Alcohol use 10/22/2009  . HYPERTENSION, BENIGN ESSENTIAL 09/25/2009  . COPD (chronic obstructive pulmonary disease) with emphysema 09/25/2009  . LOW BACK PAIN SYNDROME 09/25/2009   Past Medical History: Past Medical History  Diagnosis Date  . Hypertension   . Cancer     skin  . Arthritis   . Low back pain syndrome     Joint Pain  . Emphysema   . S/P radiation therapy 07/23/10 - 09/14/10  Right Thigh for Leiomyosarcoma / 61.2 Gy / 34 Fractions  . Leiomyosarcoma of thigh 2011  . Respiratory failure, acute 3/276/15 -11/04/13     Hospitalized  . Depression   . COPD (chronic obstructive pulmonary disease)    Past Surgical History: Past Surgical History  Procedure Laterality Date  . Laminectomy  2008    Three back surgeries per patient medical history form dated 05/06/10.  Marland Kitchen Elbow surgery  1967    L  . Leg skin lesion  biopsy / excision  04/20/10 / re-excision 06/18/10      Right thigh then re-excision of Lymph node right \\grion  06/18/10  . Back surgery    . Elbow surgery Left   . Left heart catheterization with coronary angiogram N/A 11/02/2013    Procedure: LEFT HEART CATHETERIZATION WITH CORONARY ANGIOGRAM;  Surgeon: Troy Sine, MD;  Location: [facility] CATH LAB;  Service: Cardiovascular;  Laterality: N/A;   Social History: History  Substance Use Topics  . Smoking status: Former Smoker -- 2.00 packs/day for 50 years    Types: Cigarettes    Quit date: 06/12/2014  . Smokeless tobacco: Never Used  . Alcohol Use: 1.8 oz/week    3 Cans of beer per week     Comment: Consumption per week.   Additional social history: Continues to smoke 1ppd Please also refer to relevant sections of EMR.  Family History: Family History  Problem Relation Age of Onset  . Heart disease Mother     heart attack   Allergies and Medications: No Known Allergies No current facility-administered medications on file prior to encounter.   Current Outpatient Prescriptions on File Prior to Encounter  Medication Sig Dispense Refill  . aspirin 81 MG tablet Take 1 tablet (81 mg total) by mouth daily. 30 tablet 5  . busPIRone (BUSPAR) 10 MG tablet Take 1 tablet (10 mg total) by mouth 2 (two) times daily. 60 tablet 5  . guaiFENesin (MUCINEX) 600 MG 12 hr tablet Take 1 tablet (600 mg total) by mouth 2 (two) times daily as needed. (Patient taking differently: Take 600 mg by mouth 2 (two) times daily as needed for cough or to loosen phlegm. ) 20 tablet 2  . lisinopril-hydrochlorothiazide (PRINZIDE,ZESTORETIC) 20-25 MG per tablet TAKE 1 TABLET BY MOUTH EVERY DAY 90 tablet 0  . PROAIR HFA 108 (90 BASE) MCG/ACT inhaler INHALE 2 PUFFS BY MOUTH EVERY 4 TO 6 HOURS AS NEEDED 8.5 g 0  . sertraline (ZOLOFT) 50 MG tablet TAKE 1 TABLET BY MOUTH EVERY DAY 30 tablet 2  . budesonide (PULMICORT) 0.5 MG/2ML nebulizer solution Take 2 mLs (0.5 mg total) by nebulization 2 (two) times daily. 2 mL 5  .  ipratropium-albuterol (DUONEB) 0.5-2.5 (3) MG/3ML SOLN Take 3 mLs by nebulization 4 (four) times daily. Can use an additional 2 treatments if needed. 360 mL 5  . nicotine (NICODERM CQ - DOSED IN MG/24 HOURS) 21 mg/24hr patch Place 1 patch (21 mg total) onto the skin daily. 30 patch 5    Objective: BP 104/66 mmHg  Pulse 94  Temp(Src) 98.3 F (36.8 C) (Oral)  Resp 14  Ht 5\' 9"  (1.753 m)  Wt 107 lb (48.535 kg)  BMI 15.79 kg/m2  SpO2 100% Exam: General: NAD, lying in bed cachetic. HEENT: NCAT, MMM Cardiovascular: RRR. No murmur.  Respiratory: Distant breath sounds, scattered wheezes throughout. No increased WOB. Speaking in full sentences.  Abdomen: soft, non tender, non distended, no organoegally Extremities: non-edematous, moving all extremities symetrically Skin: Warm, well  perfused, no rashes or lesions Neuro: AO, no focal deficits  Labs and Imaging: CBC BMET   Recent Labs Lab 12/10/14 2010  WBC 6.9  HGB 14.4  HCT 43.9  PLT 176    Recent Labs Lab 12/10/14 2010  NA 137  K 2.9*  CL 86*  CO2 37*  BUN 11  CREATININE 0.93  GLUCOSE 127*  CALCIUM 9.2     Veatrice Bourbon, MD 12/10/2014, 10:53 PM PGY-1, [facility] pager: [phone], text pages welcome  I have seen and evaluated the above patient.  I agree with the note. Addendum in blue.  [facility] PGY-3 Pager: [phone]

## 2014-12-11 DIAGNOSIS — F418 Other specified anxiety disorders: Secondary | ICD-10-CM | POA: Diagnosis not present

## 2014-12-11 DIAGNOSIS — J439 Emphysema, unspecified: Secondary | ICD-10-CM | POA: Diagnosis not present

## 2014-12-11 DIAGNOSIS — J9622 Acute and chronic respiratory failure with hypercapnia: Secondary | ICD-10-CM | POA: Diagnosis not present

## 2014-12-11 DIAGNOSIS — J9620 Acute and chronic respiratory failure, unspecified whether with hypoxia or hypercapnia: Secondary | ICD-10-CM

## 2014-12-11 DIAGNOSIS — E876 Hypokalemia: Secondary | ICD-10-CM | POA: Insufficient documentation

## 2014-12-11 DIAGNOSIS — E43 Unspecified severe protein-calorie malnutrition: Secondary | ICD-10-CM

## 2014-12-11 DIAGNOSIS — R634 Abnormal weight loss: Secondary | ICD-10-CM | POA: Diagnosis present

## 2014-12-11 DIAGNOSIS — J441 Chronic obstructive pulmonary disease with (acute) exacerbation: Secondary | ICD-10-CM | POA: Diagnosis not present

## 2014-12-11 DIAGNOSIS — R0602 Shortness of breath: Secondary | ICD-10-CM | POA: Diagnosis not present

## 2014-12-11 LAB — BASIC METABOLIC PANEL
ANION GAP: 14 (ref 5–15)
BUN: 11 mg/dL (ref 6–20)
CHLORIDE: 85 mmol/L — AB (ref 101–111)
CO2: 35 mmol/L — ABNORMAL HIGH (ref 22–32)
Calcium: 9.5 mg/dL (ref 8.9–10.3)
Creatinine, Ser: 0.87 mg/dL (ref 0.61–1.24)
Glucose, Bld: 142 mg/dL — ABNORMAL HIGH (ref 70–99)
POTASSIUM: 3.2 mmol/L — AB (ref 3.5–5.1)
SODIUM: 134 mmol/L — AB (ref 135–145)

## 2014-12-11 MED ORDER — POTASSIUM CHLORIDE CRYS ER 20 MEQ PO TBCR
40.0000 meq | EXTENDED_RELEASE_TABLET | ORAL | Status: AC
  Administered 2014-12-11 (×2): 40 meq via ORAL
  Filled 2014-12-11 (×2): qty 2

## 2014-12-11 MED ORDER — VITAMIN B-1 100 MG PO TABS
100.0000 mg | ORAL_TABLET | Freq: Every day | ORAL | Status: DC
  Administered 2014-12-11: 100 mg via ORAL
  Filled 2014-12-11: qty 1

## 2014-12-11 MED ORDER — DOXYCYCLINE HYCLATE 100 MG PO TABS
100.0000 mg | ORAL_TABLET | Freq: Two times a day (BID) | ORAL | Status: DC
  Administered 2014-12-11: 100 mg via ORAL
  Filled 2014-12-11: qty 1

## 2014-12-11 MED ORDER — POTASSIUM CHLORIDE CRYS ER 20 MEQ PO TBCR
40.0000 meq | EXTENDED_RELEASE_TABLET | Freq: Two times a day (BID) | ORAL | Status: DC
  Administered 2014-12-11: 40 meq via ORAL
  Filled 2014-12-11: qty 2

## 2014-12-11 MED ORDER — FOLIC ACID 1 MG PO TABS
1.0000 mg | ORAL_TABLET | Freq: Every day | ORAL | Status: DC
  Administered 2014-12-11: 1 mg via ORAL
  Filled 2014-12-11: qty 1

## 2014-12-11 MED ORDER — PREDNISONE 50 MG PO TABS: 50.0000 mg | ORAL_TABLET | Freq: Every day | ORAL | Status: DC

## 2014-12-11 MED ORDER — ENSURE ENLIVE PO LIQD
237.0000 mL | Freq: Two times a day (BID) | ORAL | Status: DC
  Administered 2014-12-11: 237 mL via ORAL

## 2014-12-11 MED ORDER — SERTRALINE HCL 50 MG PO TABS
50.0000 mg | ORAL_TABLET | Freq: Every day | ORAL | Status: DC
  Administered 2014-12-11: 50 mg via ORAL
  Filled 2014-12-11: qty 1

## 2014-12-11 MED ORDER — ACETAMINOPHEN 650 MG RE SUPP: 650.0000 mg | Freq: Four times a day (QID) | RECTAL | Status: DC | PRN

## 2014-12-11 MED ORDER — ALBUTEROL SULFATE (2.5 MG/3ML) 0.083% IN NEBU: 5.0000 mg | INHALATION_SOLUTION | RESPIRATORY_TRACT | Status: DC | PRN

## 2014-12-11 MED ORDER — ACETAMINOPHEN 325 MG PO TABS: 650.0000 mg | ORAL_TABLET | Freq: Four times a day (QID) | ORAL | Status: DC | PRN

## 2014-12-11 MED ORDER — THIAMINE HCL 100 MG/ML IJ SOLN
100.0000 mg | Freq: Every day | INTRAMUSCULAR | Status: DC
  Filled 2014-12-11: qty 2

## 2014-12-11 MED ORDER — SERTRALINE HCL 50 MG PO TABS: 50.0000 mg | ORAL_TABLET | Freq: Every day | ORAL | Status: DC

## 2014-12-11 MED ORDER — LORAZEPAM 1 MG PO TABS: 1.0000 mg | ORAL_TABLET | Freq: Four times a day (QID) | ORAL | Status: DC | PRN

## 2014-12-11 MED ORDER — PREDNISONE 50 MG PO TABS
50.0000 mg | ORAL_TABLET | Freq: Every day | ORAL | Status: DC
  Administered 2014-12-11: 50 mg via ORAL
  Filled 2014-12-11: qty 1

## 2014-12-11 MED ORDER — DOXYCYCLINE HYCLATE 100 MG PO TABS: 100.0000 mg | ORAL_TABLET | Freq: Two times a day (BID) | ORAL | Status: DC

## 2014-12-11 MED ORDER — ENSURE ENLIVE PO LIQD
237.0000 mL | Freq: Three times a day (TID) | ORAL | Status: DC
  Administered 2014-12-11: 237 mL via ORAL

## 2014-12-11 MED ORDER — ASPIRIN EC 81 MG PO TBEC
81.0000 mg | DELAYED_RELEASE_TABLET | Freq: Every day | ORAL | Status: DC
  Administered 2014-12-11: 81 mg via ORAL
  Filled 2014-12-11: qty 1

## 2014-12-11 MED ORDER — NICOTINE 21 MG/24HR TD PT24
21.0000 mg | MEDICATED_PATCH | Freq: Every day | TRANSDERMAL | Status: DC
  Administered 2014-12-11: 21 mg via TRANSDERMAL
  Filled 2014-12-11 (×2): qty 1

## 2014-12-11 MED ORDER — BUDESONIDE 0.5 MG/2ML IN SUSP
0.5000 mg | Freq: Two times a day (BID) | RESPIRATORY_TRACT | Status: DC
Start: 2014-12-11 — End: 2014-12-11
  Administered 2014-12-11: 0.5 mg via RESPIRATORY_TRACT
  Filled 2014-12-11 (×3): qty 2

## 2014-12-11 MED ORDER — BUSPIRONE HCL 10 MG PO TABS
10.0000 mg | ORAL_TABLET | Freq: Two times a day (BID) | ORAL | Status: DC
  Administered 2014-12-11 (×2): 10 mg via ORAL
  Filled 2014-12-11 (×2): qty 1

## 2014-12-11 MED ORDER — ALPRAZOLAM 0.5 MG PO TABS
0.5000 mg | ORAL_TABLET | Freq: Two times a day (BID) | ORAL | Status: DC
  Administered 2014-12-11 (×2): 0.5 mg via ORAL
  Filled 2014-12-11 (×2): qty 1

## 2014-12-11 MED ORDER — HEPARIN SODIUM (PORCINE) 5000 UNIT/ML IJ SOLN
5000.0000 [IU] | Freq: Three times a day (TID) | INTRAMUSCULAR | Status: DC
  Administered 2014-12-11: 5000 [IU] via SUBCUTANEOUS
  Filled 2014-12-11: qty 1

## 2014-12-11 MED ORDER — LORAZEPAM 2 MG/ML IJ SOLN: 1.0000 mg | Freq: Four times a day (QID) | INTRAMUSCULAR | Status: DC | PRN

## 2014-12-11 MED ORDER — ALPRAZOLAM 0.5 MG PO TABS: 0.5000 mg | ORAL_TABLET | Freq: Three times a day (TID) | ORAL | Status: DC | PRN

## 2014-12-11 MED ORDER — ADULT MULTIVITAMIN W/MINERALS CH
1.0000 | ORAL_TABLET | Freq: Every day | ORAL | Status: DC
  Administered 2014-12-11: 1 via ORAL
  Filled 2014-12-11: qty 1

## 2014-12-11 MED ORDER — IPRATROPIUM-ALBUTEROL 0.5-2.5 (3) MG/3ML IN SOLN
3.0000 mL | Freq: Four times a day (QID) | RESPIRATORY_TRACT | Status: DC
  Administered 2014-12-11 (×3): 3 mL via RESPIRATORY_TRACT
  Filled 2014-12-11 (×3): qty 3

## 2014-12-11 NOTE — Progress Notes (Signed)
Initial Nutrition Assessment  DOCUMENTATION CODES:  Severe malnutrition in context of chronic illness  INTERVENTION:  Ensure Enlive (each supplement provides 350kcal and 20 grams of protein), Snacks, MVI  NUTRITION DIAGNOSIS:  Malnutrition related to social / environmental circumstances, chronic illness as evidenced by severe depletion of body fat, severe depletion of muscle mass.   GOAL:  Patient will meet greater than or equal to 90% of their needs   MONITOR:  PO intake, Supplement acceptance, Weight trends, Labs  REASON FOR ASSESSMENT:  Malnutrition Screening Tool, Consult COPD Protocol  ASSESSMENT: 69 y.o. male presenting with worsening shortness of breath since 4 pm this afternoon. PMH is significant for COPD with ephysema, tobacco use.  RD familiar with pt from previous admission. Pt reports that he has been so weak and short of breath that he has been unable to prepare food to eat. He usually snacks on cupcakes and candy a few times daily; every one in a while neighbors bring him a meal. He reports being so weak that he has fallen 4 to 5 times recently; got hot and dizzy when standing. He states that he used to weight 160 lbs many years ago but, more recently his highest weight has been 115 lbs. Weight history shows that pt has lost 16% of his body weight in the past 2 months. Pt states he only ate eggs for breakfast this morning due to early satiety. RD encouraged pt to eat every 2-3 hours to increase calorie intake. Pt agreeable to receiving Ensure Enlive and Snacks TID while admitted. Pt hopes to go to a nursing facility after discharge; if he goes home he will need resources/support to provide food.   Labs: low potassium  Height:  Ht Readings from Last 1 Encounters:  12/11/14 5\' 9"  (1.753 m)    Weight:  Wt Readings from Last 1 Encounters:  12/11/14 92 lb 11.2 oz (42.048 kg)    Ideal Body Weight:  72.7 kg  Wt Readings from Last 10 Encounters:  12/11/14 92  lb 11.2 oz (42.048 kg)  10/11/14 110 lb 14.4 oz (50.304 kg)  09/06/14 107 lb 6.4 oz (48.716 kg)  08/01/14 101 lb (45.813 kg)  07/17/14 101 lb (45.813 kg)  07/12/14 107 lb (48.535 kg)  07/12/14 101 lb (45.813 kg)  07/02/14 99 lb (44.906 kg)  04/12/14 100 lb 3.2 oz (45.45 kg)  12/21/13 102 lb (46.267 kg)    BMI:  Body mass index is 13.68 kg/(m^2).  Estimated Nutritional Needs:  Kcal:  1700-1900  Protein:  65-80 grams  Fluid:  1.7-1.9 L/day  Skin:  Reviewed, no issues  Diet Order:  Diet Heart Room service appropriate?: Yes; Fluid consistency:: Thin  EDUCATION NEEDS:  No education needs identified at this time   Intake/Output Summary (Last 24 hours) at 12/11/14 1136 Last data filed at 12/11/14 0200  Gross per 24 hour  Intake    440 ml  Output    350 ml  Net     90 ml    Last BM:  5/3  Pryor Ochoa RD, LDN Inpatient Clinical Dietitian Pager: [phone] After Hours Pager: [phone]

## 2014-12-11 NOTE — Care Management Note (Signed)
Case Management Note  Patient Details  Name: Hector House MRN: [id number] Date of Birth: 03-13-1946  Subjective/Objective:                 Patient was admitted with COPD exacerbation. Lives at home alone.  Patient is on home oxygen therapy, which he receives from [facility].     Action/Plan:  Will follow for discharge needs. Expected Discharge Date:  12/11/14               Expected Discharge Plan:  [place]  In-House Referral:     Discharge planning Services  CM Consult  Post Acute Care Choice:    Choice offered to:     DME Arranged:    DME Agency:     HH Arranged:    [place] Agency:     Status of Service:     Medicare Important Message Given:    Date Medicare IM Given:    Medicare IM give by:    Date Additional Medicare IM Given:    Additional Medicare Important Message give by:     If discussed at [place] of Stay Meetings, dates discussed:    Additional Comments: 12/11/14 [place] RN, MSN, CM- Spoke with Family Medicine Resident regarding patient's plan of care.  CM explained that patient would not be able to discharge to a SNF, as he does not have a Medicare qualifying stay.  Patient's current plan of care could be accomplished in the outpatient setting.  CM met with patient and resident to discuss home health services as a discharge option.  Patient states that he has difficulty ambulating short distances due to shortness of breath.  He feels that if he had a wheelchair at discharge he would be able to better care for himself.  CM discussed the services that home health could provide and also discussed private duty agencies.  Patient states that he does have friends that "help out when they can", but he will need more assistance than they can provide. Patient's sister is actively involved, but is unable to provide assistance in the home due to her job.  Per patient, he and his sister have started the process of pursuing ALF placement.  CM will  continue to follow. Awaiting call from [facility] regarding discharge planning. Rolm Baptise, RN 12/11/2014, 2:53 PM

## 2014-12-11 NOTE — Progress Notes (Signed)
Chaplain responded to spiritual care consult for pt with major life transition. Pt is preparing for possible transition to assisted living; making resolution with his family, giving away his possessions ect. Pt described what he hoped heaven will be, the past mistakes he has made, and his relationship with God. Chaplain provided emotional support, explored pt narrative, explored feelings of anger and fear, and offered prayer. Chaplain will continue to follow. Page chaplain if needed before follow up.   12/11/14 1000  Clinical Encounter Type  Visited With Patient  Visit Type Initial;Spiritual support  Referral From Nurse  Spiritual Encounters  Spiritual Needs Emotional;Prayer  Stress Factors  Patient Stress Factors Major life changes  Hector House, Barbette Hair, Chaplain 12/11/2014 10:19 AM

## 2014-12-11 NOTE — Progress Notes (Signed)
RN discussed discharge instructions with patient and friend. Patient states he understands discharge medications and instructions. IV removed. Patient remaining 97% on 3L via nasal cannula. Patient denies pain, neuro assessment intact. Patient is awaiting to speak w care [facility]

## 2014-12-11 NOTE — Progress Notes (Signed)
Patient escorted to car via wheelchair with RN. Care [place] to f/u with patient tomorrow, patient aware by care [facility]

## 2014-12-11 NOTE — Care Management Note (Signed)
Case Management Note  Patient Details  Name: Hector House MRN: [id number] Date of Birth: [birth year]  Subjective/Objective:                    Action/Plan:   Expected Discharge Date:  12/11/14               Expected Discharge Plan:  [place]  In-House Referral:     Discharge planning Services  CM Consult  Post Acute Care Choice:  Durable Medical Equipment Choice offered to:  Patient  DME Arranged:  Youth worker wheelchair with seat cushion DME Agency:  [place] Arranged:  RN, PT, OT, Nurse's Aide, NA HH Agency:  [place]  Status of Service:  Completed, signed off  Medicare Important Message Given:    Date Medicare IM Given:    Medicare IM give by:    Date Additional Medicare IM Given:    Additional Medicare Important Message give by:     If discussed at [place] of Stay Meetings, dates discussed:    Additional Comments: 12/11/14 [place], MSN, CM- Met with patient and neighbor regarding discharge.  Patient's neighbor will be assisting him with caring for his dog, some household tasks and errands such as shopping and prescription refills.  She will provide transportation as needed.  CM verified with patient that his Xanax prescription would be ready for pick-up at his pharmacy this evening, as patient states his lack of Xanax was the main reason he experienced increased shortness of breath.  Patient chose [facility] for [facility] services.  Mary with [facility] was notified and has accepted the referral for discharge home today.  Patient's neighbor will be bringing his portable oxygen tank for discharge home.  Bedside RN updated. Rolm Baptise, RN 12/11/2014, 3:58 PM

## 2014-12-11 NOTE — Discharge Instructions (Signed)
You were admitted to the hospital with a COPD exacerbation. While here we started you on steroids and antibiotics. You should continue taking these after you go home. We will also send you home with a wheelchair, homehealth PT, and a home health aide.  Chronic Obstructive Pulmonary Disease Chronic obstructive pulmonary disease (COPD) is a common lung problem. In COPD, the flow of air from the lungs is limited. The way your lungs work will probably never return to normal, but there are things you can do to improve your lungs and make yourself feel better. HOME CARE  Take all medicines as told by your doctor.  Avoid medicines or cough syrups that dry up your airway (such as antihistamines) and do not allow you to get rid of thick spit. You do not need to avoid them if told differently by your doctor.  If you smoke, stop. Smoking makes the problem worse.  Avoid being around things that make your breathing worse (like smoke, chemicals, and fumes).  Use oxygen therapy and therapy to help improve your lungs (pulmonary rehabilitation) if told by your doctor. If you need home oxygen therapy, ask your doctor if you should buy a tool to measure your oxygen level (oximeter).  Avoid people who have a sickness you can catch (contagious).  Avoid going outside when it is very hot, cold, or humid.  Eat healthy foods. Eat smaller meals more often. Rest before meals.  Stay active, but remember to also rest.  Make sure to get all the shots (vaccines) your doctor recommends. Ask your doctor if you need a pneumonia shot.  Learn and use tips on how to relax.  Learn and use tips on how to control your breathing as told by your doctor. Try:  Breathing in (inhaling) through your nose for 1 second. Then, pucker your lips and breath out (exhale) through your lips for 2 seconds.  Putting one hand on your belly (abdomen). Breathe in slowly through your nose for 1 second. Your hand on your belly should move out.  Pucker your lips and breathe out slowly through your lips. Your hand on your belly should move in as you breathe out.  Learn and use controlled coughing to clear thick spit from your lungs. The steps are: 1. Lean your head a little forward. 2. Breathe in deeply. 3. Try to hold your breath for 3 seconds. 4. Keep your mouth slightly open while coughing 2 times. 5. Spit any thick spit out into a tissue. 6. Rest and do the steps again 1 or 2 times as needed. GET HELP IF:  You cough up more thick spit than usual.  There is a change in the color or thickness of the spit.  It is harder to breathe than usual.  Your breathing is faster than usual. GET HELP RIGHT AWAY IF:   You have shortness of breath while resting.  You have shortness of breath that stops you from:  Being able to talk.  Doing normal activities.  You chest hurts for longer than 5 minutes.  Your skin color is more blue than usual.  Your pulse oximeter shows that you have low oxygen for longer than 5 minutes. MAKE SURE YOU:   Understand these instructions.  Will watch your condition.  Will get help right away if you are not doing well or get worse. Document Released: 01/12/2008 Document Revised: 12/10/2013 Document Reviewed: 03/22/2013 [facility] Patient Information 2015 [place], [place]. This information is not intended to replace advice given to you  by your health care provider. Make sure you discuss any questions you have with your health care provider. ° °

## 2014-12-11 NOTE — Progress Notes (Signed)
UR completed 
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
## 2014-12-11 NOTE — Evaluation (Addendum)
Physical Therapy Evaluation Patient Details Name: Hector House MRN: [id number] DOB: 1946/01/07 Today's Date: 12/11/2014   History of Present Illness  69 yo male presents with worsening SOB. PMH COPD with ephysema and tobacco use  Clinical Impression  Patient demonstrates deficits in functional mobility as indicated below. Will need continued skilled PT to address deficits and maximize function. Will see as indicated and progress as tolerated. Will need ST SNF upon acute discharge to improve functional endurance and mobility.  OF NOTE: patient desaturating on 4 liters with activity, increased WOB and use of accessory muscles. Resp to room for treatment mid session, improved saturations following treatment.     Follow Up Recommendations SNF;Supervision/Assistance - 24 hour    Equipment Recommendations  Other (comment) (TBD)    Recommendations for Other Services       Precautions / Restrictions Precautions Precautions: Fall (oxygen requirements, moniotr sats)      Mobility  Bed Mobility Overal bed mobility: Modified Independent             General bed mobility comments: increased time, cues for safety with mobility  Transfers Overall transfer level: Needs assistance Equipment used: None Transfers: Sit to/from Stand Sit to Stand: Min guard         General transfer comment: impuslive with poor attention to safety  Ambulation/Gait Ambulation/Gait assistance: Min assist Ambulation Distance (Feet): 24 Feet Assistive device: 1 person hand held assist Gait Pattern/deviations: Step-through pattern;Decreased stride length;Trunk flexed     General Gait Details: instability with gait secodnary to decreased endurance and SOB  Stairs            Wheelchair Mobility    Modified Rankin (Stroke Patients Only)       Balance                                             Pertinent Vitals/Pain Pain Assessment: Faces Faces Pain Scale: Hurts little  more Pain Location: chest from breathing Pain Descriptors / Indicators: Discomfort Pain Intervention(s): Relaxation    Home Living Family/patient expects to be discharged to:: Skilled nursing facility Living Arrangements: Alone     Home Access: Stairs to enter   Entrance Stairs-Number of Steps: 2 Home Layout: One level (pt reports he has made homemade ramps throughout) Home Equipment: Walker - 2 wheels;Cane - single point;Bedside commode;Shower seat;Transport chair      Prior Function           Comments: uses 3 liters supplemental O2 at baseline     Hand Dominance   Dominant Hand: Right    Extremity/Trunk Assessment   Upper Extremity Assessment: Generalized weakness           Lower Extremity Assessment: Generalized weakness      Cervical / Trunk Assessment: Kyphotic  Communication   Communication: No difficulties  Cognition Arousal/Alertness: Awake/alert Behavior During Therapy: Anxious;Impulsive Overall Cognitive Status: No family/caregiver present to determine baseline cognitive functioning                      General Comments      Exercises        Assessment/Plan    PT Assessment Patient needs continued PT services  PT Diagnosis Difficulty walking;Generalized weakness   PT Problem List Decreased strength;Decreased activity tolerance;Decreased balance;Decreased mobility;Decreased safety awareness;Pain;Cardiopulmonary status limiting activity  PT Treatment Interventions DME instruction;Gait training;Functional mobility training;Therapeutic  activities;Therapeutic exercise;Balance training;Patient/family education   PT Goals (Current goals can be found in the Care Plan section) Acute Rehab PT Goals Patient Stated Goal: to be bale to breathe PT Goal Formulation: With patient Time For Goal Achievement: 12/25/14 Potential to Achieve Goals: Fair    Frequency Min 2X/week   Barriers to discharge Decreased caregiver support       Co-evaluation               End of Session Equipment Utilized During Treatment: Oxygen Activity Tolerance: Patient limited by fatigue (decreased O2 saturations, SOB) Patient left: in bed;with call bell/phone within reach;with bed alarm set Nurse Communication: Mobility status         Time: 6803-2122 PT Time Calculation (min) (ACUTE ONLY): 24 min   Charges:   PT Evaluation $Initial PT Evaluation Tier I: 1 Procedure     PT G CodesDuncan Dull 01/01/2015, 10:05 AM Alben Deeds, PT DPT  [phone]

## 2014-12-11 NOTE — Progress Notes (Signed)
Family Medicine Teaching Service Daily Progress Note Intern Pager: [phone]  Patient name: Hector House Medical record number: [id number] Date of birth: 1946/02/09 Age: 69 y.o. Gender: male  Primary Care Provider: [name], MD Consultants: None Code Status: Full  Pt Overview and Major Events to Date:  5/3 - Admitted with COPD Exacerbation  Assessment and Plan: Hector House is a 68 y.o. male presenting with worsening shortness of breath since 4 pm this afternoon. PMH is significant for COPD with ephysema, tobacco use  Acute COPD exacerbation: Improvement of symptoms to baseline s/p duonebs and solumedrol in ED. Anxiety likely playing a significant role. No infectious symptoms or increased O2 requirement at this time. On 3L [place] at home. - Resume home budesonide - Duonebs q6, albuterol q2hr PRN.  - Consider starting antibiotics. - Solumedrol in the ED; Prednisone 50 mg daily starting 5/4.   Anxiety/Depression- Pt feels his anxiety has largely prompted his admission, as he is out of his home Xanax - Home Xanax as needed - Buspirone 10 mg - Zoloft 50 mg Daily  Hypokalemia- K 2.9 on admission, likely in setting of albuterol administration - kdur 28mEq x2 - f/u BMET  Tobacco Use - continues to smoke 1 ppd - Nicotine patch.  HTN: Soft BPs - Will hold home lisinopril-HCTZ at this time - closely monitor BPs   FEN/GI: Heart healthy diet, SLIV PPx: SubQ heparin  Disposition: Admitted pending above management. Patient reports wanting nursing home placement. PT/OT consulted.   Subjective:  Continues to have difficulty breathing this morning. States that he was unable to go to bathroom without becoming short of breath. Interested in nursing home placement.   Objective: Temp:  [97.4 F (36.3 C)-98.3 F (36.8 C)] 97.4 F (36.3 C) (05/04 0619) Pulse Rate:  [78-94] 78 (05/04 0619) Resp:  [14-20] 20 (05/04 0619) BP: (82-146)/(49-89) 82/49 mmHg (05/04 0619) SpO2:  [98 %-100 %]  100 % (05/04 0619) FiO2 (%):  [3 %] 3 % (05/04 0619) Weight:  [92 lb 11.2 oz (42.048 kg)-107 lb (48.535 kg)] 92 lb 11.2 oz (42.048 kg) (05/04 0100) Physical Exam: General: NAD, lying in bed cachetic. HEENT: NCAT, MMM Cardiovascular: RRR. No murmur.  Respiratory: Distant breath sounds, scattered wheezes throughout. No increased WOB. Speaking in full sentences.  Abdomen: soft, non tender, non distended, no organomegally Extremities: non-edematous, moving all extremities symetrically Skin: Warm, well perfused, no rashes or lesions Neuro: AO, no focal deficits  Laboratory:  Recent Labs Lab 12/10/14 2010  WBC 6.9  HGB 14.4  HCT 43.9  PLT 176    Recent Labs Lab 12/10/14 2010  NA 137  K 2.9*  CL 86*  CO2 37*  BUN 11  CREATININE 0.93  CALCIUM 9.2  GLUCOSE 127*    Caleb Burnetta Sabin, MD 12/11/2014, 7:25 AM PGY-1, [place] Intern pager: [phone], text pages welcome

## 2014-12-12 ENCOUNTER — Telehealth: Payer: Self-pay | Admitting: Family Medicine

## 2014-12-12 ENCOUNTER — Ambulatory Visit: Payer: Medicare Other | Admitting: Family Medicine

## 2014-12-12 MED ORDER — ALBUTEROL SULFATE HFA 108 (90 BASE) MCG/ACT IN AERS: INHALATION_SPRAY | RESPIRATORY_TRACT | Status: DC

## 2014-12-12 NOTE — Telephone Encounter (Signed)
[facility] After Hours  Patient requesting refill on albuterol. He does not have any refills since being discharged from the hospital. Will refill albuterol. Sent to pharmacy.  [name], MD PGY-2, [facility] 12/12/2014, 7:30 PM

## 2014-12-13 ENCOUNTER — Encounter (HOSPITAL_COMMUNITY): Payer: Self-pay | Admitting: Emergency Medicine

## 2014-12-13 ENCOUNTER — Emergency Department (HOSPITAL_COMMUNITY): Payer: Medicare Other

## 2014-12-13 ENCOUNTER — Emergency Department (HOSPITAL_COMMUNITY)
Admission: EM | Admit: 2014-12-13 | Discharge: 2014-12-13 | Disposition: A | Discharge status: Home / Self Care | Payer: Medicare Other | Attending: Emergency Medicine | Admitting: Emergency Medicine

## 2014-12-13 DIAGNOSIS — Z9889 Other specified postprocedural states: Secondary | ICD-10-CM | POA: Diagnosis not present

## 2014-12-13 DIAGNOSIS — Z79899 Other long term (current) drug therapy: Secondary | ICD-10-CM | POA: Insufficient documentation

## 2014-12-13 DIAGNOSIS — Z923 Personal history of irradiation: Secondary | ICD-10-CM | POA: Insufficient documentation

## 2014-12-13 DIAGNOSIS — Z7982 Long term (current) use of aspirin: Secondary | ICD-10-CM | POA: Insufficient documentation

## 2014-12-13 DIAGNOSIS — R0602 Shortness of breath: Secondary | ICD-10-CM

## 2014-12-13 DIAGNOSIS — R079 Chest pain, unspecified: Secondary | ICD-10-CM | POA: Diagnosis not present

## 2014-12-13 DIAGNOSIS — Z7951 Long term (current) use of inhaled steroids: Secondary | ICD-10-CM | POA: Diagnosis not present

## 2014-12-13 DIAGNOSIS — J441 Chronic obstructive pulmonary disease with (acute) exacerbation: Secondary | ICD-10-CM | POA: Insufficient documentation

## 2014-12-13 DIAGNOSIS — F329 Major depressive disorder, single episode, unspecified: Secondary | ICD-10-CM | POA: Diagnosis not present

## 2014-12-13 DIAGNOSIS — Z87891 Personal history of nicotine dependence: Secondary | ICD-10-CM | POA: Diagnosis not present

## 2014-12-13 DIAGNOSIS — G894 Chronic pain syndrome: Secondary | ICD-10-CM | POA: Diagnosis not present

## 2014-12-13 DIAGNOSIS — I10 Essential (primary) hypertension: Secondary | ICD-10-CM | POA: Diagnosis not present

## 2014-12-13 DIAGNOSIS — F419 Anxiety disorder, unspecified: Secondary | ICD-10-CM | POA: Insufficient documentation

## 2014-12-13 DIAGNOSIS — M1990 Unspecified osteoarthritis, unspecified site: Secondary | ICD-10-CM | POA: Diagnosis not present

## 2014-12-13 DIAGNOSIS — R062 Wheezing: Secondary | ICD-10-CM | POA: Diagnosis not present

## 2014-12-13 DIAGNOSIS — Z7952 Long term (current) use of systemic steroids: Secondary | ICD-10-CM | POA: Diagnosis not present

## 2014-12-13 DIAGNOSIS — Z85828 Personal history of other malignant neoplasm of skin: Secondary | ICD-10-CM | POA: Insufficient documentation

## 2014-12-13 LAB — CBC WITH DIFFERENTIAL/PLATELET
Basophils Absolute: 0 10*3/uL (ref 0.0–0.1)
Basophils Relative: 0 % (ref 0–1)
EOS ABS: 0 10*3/uL (ref 0.0–0.7)
EOS PCT: 0 % (ref 0–5)
HEMATOCRIT: 39.5 % (ref 39.0–52.0)
Hemoglobin: 13.2 g/dL (ref 13.0–17.0)
LYMPHS PCT: 4 % — AB (ref 12–46)
Lymphs Abs: 0.3 10*3/uL — ABNORMAL LOW (ref 0.7–4.0)
MCH: 30.4 pg (ref 26.0–34.0)
MCHC: 33.4 g/dL (ref 30.0–36.0)
MCV: 91 fL (ref 78.0–100.0)
MONOS PCT: 1 % — AB (ref 3–12)
Monocytes Absolute: 0.1 10*3/uL (ref 0.1–1.0)
Neutro Abs: 7 10*3/uL (ref 1.7–7.7)
Neutrophils Relative %: 95 % — ABNORMAL HIGH (ref 43–77)
Platelets: 180 10*3/uL (ref 150–400)
RBC: 4.34 MIL/uL (ref 4.22–5.81)
RDW: 12.6 % (ref 11.5–15.5)
WBC: 7.4 10*3/uL (ref 4.0–10.5)

## 2014-12-13 LAB — URINALYSIS, ROUTINE W REFLEX MICROSCOPIC
Bilirubin Urine: NEGATIVE
Glucose, UA: NEGATIVE mg/dL
HGB URINE DIPSTICK: NEGATIVE
KETONES UR: 15 mg/dL — AB
Leukocytes, UA: NEGATIVE
Nitrite: NEGATIVE
PH: 5.5 (ref 5.0–8.0)
Protein, ur: NEGATIVE mg/dL
SPECIFIC GRAVITY, URINE: 1.021 (ref 1.005–1.030)
Urobilinogen, UA: 0.2 mg/dL (ref 0.0–1.0)

## 2014-12-13 LAB — BASIC METABOLIC PANEL
Anion gap: 12 (ref 5–15)
BUN: 20 mg/dL (ref 6–20)
CO2: 34 mmol/L — ABNORMAL HIGH (ref 22–32)
CREATININE: 0.86 mg/dL (ref 0.61–1.24)
Calcium: 9.2 mg/dL (ref 8.9–10.3)
Chloride: 87 mmol/L — ABNORMAL LOW (ref 101–111)
Glucose, Bld: 138 mg/dL — ABNORMAL HIGH (ref 70–99)
Potassium: 2.9 mmol/L — ABNORMAL LOW (ref 3.5–5.1)
Sodium: 133 mmol/L — ABNORMAL LOW (ref 135–145)

## 2014-12-13 LAB — I-STAT TROPONIN, ED: TROPONIN I, POC: 0 ng/mL (ref 0.00–0.08)

## 2014-12-13 LAB — BRAIN NATRIURETIC PEPTIDE: B NATRIURETIC PEPTIDE 5: 59 pg/mL (ref 0.0–100.0)

## 2014-12-13 MED ORDER — POTASSIUM CHLORIDE CRYS ER 20 MEQ PO TBCR
40.0000 meq | EXTENDED_RELEASE_TABLET | Freq: Once | ORAL | Status: AC
  Administered 2014-12-13: 40 meq via ORAL
  Filled 2014-12-13: qty 2

## 2014-12-13 NOTE — ED Notes (Signed)
Sister's number- [phone].  Sister wishes to be contacted if pt is moved from dept./discharged.

## 2014-12-13 NOTE — Discharge Instructions (Signed)

## 2014-12-13 NOTE — ED Notes (Addendum)
EMS - Patient coming from home with c/o of SOB.  Patient was admitted on 5/3 and discharge on 5/4 with acute on Chronic respiratory failure.  Fire on scene got a room air of 84%, given 1 Albuterol treatment.  EMS gave a Duoneb and 125mg  Solumedrol in route.  109/68, 97 HR, 98% room air and 24 respirations.    Patient wants help getting in to an [place].

## 2014-12-13 NOTE — ED Provider Notes (Signed)
CSN: [id number]     Arrival date & time 12/13/14  1003 History   First MD Initiated Contact with Patient 12/13/14 1026     Chief Complaint  Patient presents with  . Shortness of Breath     (Consider location/radiation/quality/duration/timing/severity/associated sxs/prior Treatment) HPI Comments: 69 year old male presents with difficulty breathing. PMH includes COPD, HTN, depression, and anxiety. Was hospitalized on 12/10/14 for acute COPD exacerbation and was discharged on 12/11/14 with 5 day course of prednisone and doxycycline, unknown if patient is taking these prescriptions. Patient returns today for difficulty breathing that he says has not resolved since its initial onset on 12/10/14. He has been out of his Xanax for 2 weeks and according to his sister he "lives with anxiety every minute". He did not go to his scheduled appointment with his PCP yesterday because "he could not breath" and does not know why he was discharged from hospital. Complains of difficulty breathing and. chest tightness. Also complains of abdominal pain and occasional productive cough with non-blood sputum that are not new complaints. Reports using his albuterol inhaler 18-20 times this morning for SOB. Had some diarrhea 3 days ago. Has decreased appetite and has not eaten in two days. Denies nausea, constipation, vomiting, dizziness, chills, fever.    ROS 10 Systems reviewed and are negative for acute change except as noted in the HPI.     The history is provided by the patient.    Past Medical History  Diagnosis Date  . Hypertension   . Cancer     skin  . Arthritis   . Low back pain syndrome     Joint Pain  . Emphysema   . S/P radiation therapy 07/23/10 - 09/14/10    Right Thigh for Leiomyosarcoma / 61.2 Gy / 34 Fractions  . Leiomyosarcoma of thigh 2011  . Respiratory failure, acute 3/276/15 -11/04/13     Hospitalized  . Depression   . COPD (chronic obstructive pulmonary disease)    Past Surgical History   Procedure Laterality Date  . Laminectomy  2008    Three back surgeries per patient medical history form dated 05/06/10.  Marland Kitchen Elbow surgery  1967    L  . Leg skin lesion  biopsy / excision  04/20/10 / re-excision 06/18/10     Right thigh then re-excision of Lymph node right \\grion  06/18/10  . Back surgery    . Elbow surgery Left   . Left heart catheterization with coronary angiogram N/A 11/02/2013    Procedure: LEFT HEART CATHETERIZATION WITH CORONARY ANGIOGRAM;  Surgeon: Troy Sine, MD;  Location: [facility] CATH LAB;  Service: Cardiovascular;  Laterality: N/A;   Family History  Problem Relation Age of Onset  . Heart disease Mother     heart attack   History  Substance Use Topics  . Smoking status: Former Smoker -- 2.00 packs/day for 50 years    Types: Cigarettes    Quit date: 06/12/2014  . Smokeless tobacco: Never Used  . Alcohol Use: 1.8 oz/week    3 Cans of beer per week     Comment: Consumption per week.    Review of Systems  Constitutional: Negative for activity change and appetite change.  Respiratory: Positive for shortness of breath and wheezing. Negative for cough.   Cardiovascular: Positive for chest pain.  Gastrointestinal: Negative for abdominal pain.  Genitourinary: Negative for dysuria.  All other systems reviewed and are negative.     Allergies  Review of patient's allergies indicates no known allergies.  Home  Medications   Prior to Admission medications   Medication Sig Start Date End Date Taking? Authorizing Provider  albuterol (PROAIR HFA) 108 (90 BASE) MCG/ACT inhaler INHALE 2 PUFFS BY MOUTH EVERY 4 TO 6 HOURS AS NEEDED 12/12/14  Yes Mariel Aloe, MD  ALPRAZolam Duanne Moron) 0.5 MG tablet Take 0.5 mg by mouth as needed. 11/17/14  Yes Historical Provider, MD  aspirin 81 MG tablet Take 1 tablet (81 mg total) by mouth daily. 12/21/13  Yes Mariel Aloe, MD  budesonide (PULMICORT) 0.5 MG/2ML nebulizer solution Take 2 mLs (0.5 mg total) by nebulization 2 (two) times  daily. 12/21/13  Yes Mariel Aloe, MD  busPIRone (BUSPAR) 10 MG tablet Take 1 tablet (10 mg total) by mouth 2 (two) times daily. 10/11/14  Yes Mariel Aloe, MD  guaiFENesin (MUCINEX) 600 MG 12 hr tablet Take 1 tablet (600 mg total) by mouth 2 (two) times daily as needed. Patient taking differently: Take 600 mg by mouth 2 (two) times daily as needed for cough or to loosen phlegm.  12/21/13  Yes Mariel Aloe, MD  ipratropium-albuterol (DUONEB) 0.5-2.5 (3) MG/3ML SOLN Take 3 mLs by nebulization 4 (four) times daily. Can use an additional 2 treatments if needed. 06/07/14  Yes Kathee Delton, MD  lactose free nutrition (BOOST) LIQD Take 237 mLs by mouth daily.   Yes Historical Provider, MD  lisinopril-hydrochlorothiazide (PRINZIDE,ZESTORETIC) 20-25 MG per tablet TAKE 1 TABLET BY MOUTH EVERY DAY 11/29/14  Yes Mariel Aloe, MD  sertraline (ZOLOFT) 50 MG tablet Take 1 tablet (50 mg total) by mouth daily. 12/11/14  Yes Mariel Aloe, MD  doxycycline (VIBRA-TABS) 100 MG tablet Take 1 tablet (100 mg total) by mouth every 12 (twelve) hours. 12/11/14   Vivi Barrack, MD  nicotine (NICODERM CQ - DOSED IN MG/24 HOURS) 21 mg/24hr patch Place 1 patch (21 mg total) onto the skin daily. 07/17/14   Mariel Aloe, MD  predniSONE (DELTASONE) 50 MG tablet 50 mg daily x 5 days. 12/10/14   Jayce G Cook, DO   BP 95/51 mmHg  Pulse 86  Temp(Src) 98.7 F (37.1 C) (Oral)  Resp 18  SpO2 95% Physical Exam  Constitutional: He is oriented to person, place, and time. He appears well-developed.  HENT:  Head: Normocephalic and atraumatic.  Eyes: Conjunctivae and EOM are normal. Pupils are equal, round, and reactive to light.  Neck: Normal range of motion. Neck supple.  Cardiovascular: Normal rate and regular rhythm.   Pulmonary/Chest: Effort normal. No respiratory distress. He has wheezes.  Abdominal: Soft. Bowel sounds are normal. He exhibits no distension. There is no tenderness. There is no rebound and no guarding.   Neurological: He is alert and oriented to person, place, and time.  Skin: Skin is warm.  Nursing note and vitals reviewed.   ED Course  Procedures (including critical care time) Labs Review Labs Reviewed  CBC WITH DIFFERENTIAL/PLATELET - Abnormal; Notable for the following:    Neutrophils Relative % 95 (*)    Lymphocytes Relative 4 (*)    Lymphs Abs 0.3 (*)    Monocytes Relative 1 (*)    All other components within normal limits  BASIC METABOLIC PANEL - Abnormal; Notable for the following:    Sodium 133 (*)    Potassium 2.9 (*)    Chloride 87 (*)    CO2 34 (*)    Glucose, Bld 138 (*)    All other components within normal limits  URINALYSIS, ROUTINE W REFLEX  MICROSCOPIC - Abnormal; Notable for the following:    Color, Urine AMBER (*)    Ketones, ur 15 (*)    All other components within normal limits  BRAIN NATRIURETIC PEPTIDE  I-STAT TROPOININ, ED    Imaging Review Dg Chest 2 View  12/13/2014   CLINICAL DATA:  Sob with anxiety . Former smoker x 50 yrs  EXAM: CHEST  2 VIEW  COMPARISON:  12/10/2014.  FINDINGS: Cardiac silhouette normal in size and configuration. No mediastinal or hilar masses or evidence of adenopathy.  Lungs are hyperexpanded. There is stable upper lobe scarring retracting the hila superiorly. Mild interstitial thickening is noted most evident in the peripheral lung bases. No lung consolidation or edema. No pleural effusion or pneumothorax.  Bony thorax is demineralized but intact.  IMPRESSION: 1. No acute cardiopulmonary disease. 2. COPD. 3. No change from the recent prior study.   Electronically Signed   By: Lajean Manes M.D.   On: 12/13/2014 13:14     EKG Interpretation   Date/Time:  Friday Dec 13 2014 10:13:15 EDT Ventricular Rate:  101 PR Interval:  153 QRS Duration: 84 QT Interval:  322 QTC Calculation: 417 R Axis:   -92 Text Interpretation:  Sinus tachycardia Right atrial enlargement Left  anterior fascicular block Right ventricular hypertrophy  Abnormal ECG No  significant change since last tracing Confirmed by Domonique Cothran, MD, Thelma Comp  [phone]) on 12/13/2014 10:42:32 AM      MDM   Final diagnoses:  Shortness of breath  Anxiety   Pt comes in with cc of dib. Pt has hx of anxiety and COPD. W/o any treatments here, he got better and was in no resp distress. Case management called - pt's f/u instructions read, and we confirmed home rn, pt/ot f/u and the xanax rx information.  Will d.c pt.   Varney Biles, MD 12/13/14 [phone]

## 2014-12-16 ENCOUNTER — Encounter: Payer: Self-pay | Admitting: Family Medicine

## 2014-12-16 ENCOUNTER — Ambulatory Visit (INDEPENDENT_AMBULATORY_CARE_PROVIDER_SITE_OTHER): Payer: Medicare Other | Admitting: Family Medicine

## 2014-12-16 VITALS — BP 71/48 | HR 87 | Ht 69.0 in | Wt 96.3 lb

## 2014-12-16 DIAGNOSIS — E43 Unspecified severe protein-calorie malnutrition: Secondary | ICD-10-CM

## 2014-12-16 DIAGNOSIS — F418 Other specified anxiety disorders: Secondary | ICD-10-CM

## 2014-12-16 DIAGNOSIS — E876 Hypokalemia: Secondary | ICD-10-CM

## 2014-12-16 DIAGNOSIS — J439 Emphysema, unspecified: Secondary | ICD-10-CM | POA: Diagnosis present

## 2014-12-16 DIAGNOSIS — F064 Anxiety disorder due to known physiological condition: Secondary | ICD-10-CM

## 2014-12-16 MED ORDER — ALBUTEROL SULFATE HFA 108 (90 BASE) MCG/ACT IN AERS: INHALATION_SPRAY | RESPIRATORY_TRACT | Status: DC
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
## 2014-12-16 NOTE — Patient Instructions (Signed)
Thank you for coming in, today!  Everything looks okay, today. Your blood pressure reading is low, but I don't think it's actually that low. I don't want to change any medicines, today. We'll check your bloodwork and call you if we need to do anything. Otherwise I'll write you a letter.  Come back to see Dr. Lonny Prude on 6/6 at 2:15. You have an appointment at Dr. Janifer Adie office on 5/31. Call us or come back to see [place] as you need. Call and ask to speak to Constance Holster if you need help with social work questions.  Please feel free to call with any questions or concerns at any time, at [phone]. --Dr. Venetia Maxon

## 2014-12-17 ENCOUNTER — Telehealth: Payer: Self-pay | Admitting: Family Medicine

## 2014-12-17 LAB — BASIC METABOLIC PANEL WITH GFR
BUN: 41 mg/dL — ABNORMAL HIGH (ref 6–23)
CO2: 40 mEq/L — ABNORMAL HIGH (ref 19–32)
Calcium: 9.2 mg/dL (ref 8.4–10.5)
Chloride: 84 mEq/L — ABNORMAL LOW (ref 96–112)
Creat: 1.13 mg/dL (ref 0.50–1.35)
GFR, EST NON AFRICAN AMERICAN: 67 mL/min
GFR, Est African American: 77 mL/min
Glucose, Bld: 90 mg/dL (ref 70–99)
POTASSIUM: 2.8 meq/L — AB (ref 3.5–5.3)
SODIUM: 133 meq/L — AB (ref 135–145)

## 2014-12-17 MED ORDER — POTASSIUM CHLORIDE ER 20 MEQ PO TBCR: 40.0000 meq | EXTENDED_RELEASE_TABLET | Freq: Two times a day (BID) | ORAL | Status: DC

## 2014-12-17 NOTE — Telephone Encounter (Signed)
Called pt to discuss lab results; potassium remains low at 2.8. Pt reports no change in symptoms (denies frank chest pain / dizziness / numbness / weakness, does endorse chronic SOB but reports no problems with it). Will call in K-Dur 40 mg BID for 3 days. Advised pt to call to make an appointment to be seen in about 1 week to be re-checked. Also advised him to call or go to the ED if he has any of the above symptoms or significant change in status. Pt voiced understanding and agreement with plan.  FYI to PCP Dr. Lonny Prude.  Emmaline Kluver, MD PGY-3, [place] 12/17/2014, 7:57 AM

## 2014-12-18 ENCOUNTER — Other Ambulatory Visit: Payer: Self-pay | Admitting: Family Medicine

## 2014-12-19 ENCOUNTER — Other Ambulatory Visit: Payer: Self-pay | Admitting: Family Medicine

## 2014-12-20 ENCOUNTER — Ambulatory Visit: Payer: Medicare Other | Admitting: Family Medicine

## 2014-12-20 NOTE — Telephone Encounter (Signed)
Received refill request for K-Dur, declined. Pt should not need to be on this chronically, and would need to see Dr. Lonny Prude or someone else and / or have potassium rechecked. See recent office note / phone note from me.  Blue Team: Please call pt to relay the following. He should NOT need a refill of potassium. I would advise him to come back to see Dr. Lonny Prude sometime this month to see if he needs it rechecked, or if anything else is going on. Otherwise, he can see Dr. Lonny Prude as needed. Thanks! --CMS

## 2014-12-20 NOTE — Telephone Encounter (Signed)
Patient has an appt on 01-13-15. Jazmin Hartsell,CMA

## 2014-12-26 ENCOUNTER — Telehealth: Payer: Self-pay | Admitting: Family Medicine

## 2014-12-26 MED ORDER — LISINOPRIL-HYDROCHLOROTHIAZIDE 20-25 MG PO TABS: 1.0000 | ORAL_TABLET | Freq: Every day | ORAL | Status: DC

## 2014-12-26 NOTE — Telephone Encounter (Signed)
This patient needs a refill on lisinopril to last until his next appt on January 13, 2015. Thank you, Fonda Kinder, ASA

## 2015-01-07 ENCOUNTER — Ambulatory Visit: Payer: Medicare Other | Admitting: Pulmonary Disease

## 2015-01-07 ENCOUNTER — Other Ambulatory Visit: Payer: Self-pay | Admitting: Family Medicine

## 2015-01-07 NOTE — Telephone Encounter (Signed)
Rx was called into pharmacy.  He just received 60 on 12/14/14 so he is not eligible to get the refill for 3 more days, pt is ok with this as he has "4 more left".  He also understands that additional refill will not be given until appt on 01/13/15.   During call he requested a refill on his albuterol will forward to MD. Clinton Sawyer, Salome Spotted

## 2015-01-07 NOTE — Telephone Encounter (Signed)
Patient already has two refills from last prescription refill.

## 2015-01-13 ENCOUNTER — Ambulatory Visit: Payer: Medicare Other | Admitting: Family Medicine

## 2015-01-15 ENCOUNTER — Ambulatory Visit: Payer: Medicare Other | Admitting: Pulmonary Disease

## 2015-01-16 ENCOUNTER — Emergency Department (HOSPITAL_COMMUNITY): Payer: Medicare Other

## 2015-01-16 ENCOUNTER — Encounter (HOSPITAL_COMMUNITY): Payer: Self-pay | Admitting: Emergency Medicine

## 2015-01-16 ENCOUNTER — Inpatient Hospital Stay (HOSPITAL_COMMUNITY)
Admission: EM | Admit: 2015-01-16 | Discharge: 2015-01-17 | DRG: 192 | Disposition: A | Discharge status: Home Health | Payer: Medicare Other | Attending: Family Medicine | Admitting: Family Medicine

## 2015-01-16 ENCOUNTER — Other Ambulatory Visit: Payer: Self-pay | Admitting: Family Medicine

## 2015-01-16 DIAGNOSIS — Z66 Do not resuscitate: Secondary | ICD-10-CM | POA: Diagnosis present

## 2015-01-16 DIAGNOSIS — M1990 Unspecified osteoarthritis, unspecified site: Secondary | ICD-10-CM | POA: Diagnosis present

## 2015-01-16 DIAGNOSIS — F419 Anxiety disorder, unspecified: Secondary | ICD-10-CM | POA: Diagnosis present

## 2015-01-16 DIAGNOSIS — Z87891 Personal history of nicotine dependence: Secondary | ICD-10-CM | POA: Diagnosis not present

## 2015-01-16 DIAGNOSIS — Z79899 Other long term (current) drug therapy: Secondary | ICD-10-CM | POA: Diagnosis not present

## 2015-01-16 DIAGNOSIS — Z9981 Dependence on supplemental oxygen: Secondary | ICD-10-CM | POA: Diagnosis not present

## 2015-01-16 DIAGNOSIS — E876 Hypokalemia: Secondary | ICD-10-CM | POA: Diagnosis present

## 2015-01-16 DIAGNOSIS — Z7982 Long term (current) use of aspirin: Secondary | ICD-10-CM | POA: Diagnosis not present

## 2015-01-16 DIAGNOSIS — F329 Major depressive disorder, single episode, unspecified: Secondary | ICD-10-CM | POA: Diagnosis present

## 2015-01-16 DIAGNOSIS — Z923 Personal history of irradiation: Secondary | ICD-10-CM | POA: Diagnosis not present

## 2015-01-16 DIAGNOSIS — J441 Chronic obstructive pulmonary disease with (acute) exacerbation: Secondary | ICD-10-CM | POA: Diagnosis present

## 2015-01-16 DIAGNOSIS — I10 Essential (primary) hypertension: Secondary | ICD-10-CM | POA: Diagnosis present

## 2015-01-16 DIAGNOSIS — Z85828 Personal history of other malignant neoplasm of skin: Secondary | ICD-10-CM

## 2015-01-16 DIAGNOSIS — R0602 Shortness of breath: Secondary | ICD-10-CM | POA: Diagnosis present

## 2015-01-16 HISTORY — DX: Other chronic pain: G89.29

## 2015-01-16 HISTORY — DX: Pneumonia, unspecified organism: J18.9

## 2015-01-16 HISTORY — DX: Headache, unspecified: R51.9

## 2015-01-16 HISTORY — DX: Anxiety disorder, unspecified: F41.9

## 2015-01-16 HISTORY — DX: Headache: R51

## 2015-01-16 HISTORY — DX: Dorsalgia, unspecified: M54.9

## 2015-01-16 LAB — BASIC METABOLIC PANEL
Anion gap: 16 — ABNORMAL HIGH (ref 5–15)
BUN: 8 mg/dL (ref 6–20)
CALCIUM: 9.1 mg/dL (ref 8.9–10.3)
CO2: 36 mmol/L — AB (ref 22–32)
CREATININE: 0.98 mg/dL (ref 0.61–1.24)
Chloride: 77 mmol/L — ABNORMAL LOW (ref 101–111)
GFR calc Af Amer: >60 mL/min (ref >60)
GFR calc non Af Amer: >60 mL/min (ref >60)
GLUCOSE: 134 mg/dL — AB (ref 65–99)
Potassium: 2.7 mmol/L — CL (ref 3.5–5.1)
Sodium: 129 mmol/L — ABNORMAL LOW (ref 135–145)

## 2015-01-16 LAB — CBC
HCT: 38.8 % — ABNORMAL LOW (ref 39.0–52.0)
HEMOGLOBIN: 13.3 g/dL (ref 13.0–17.0)
MCH: 30.6 pg (ref 26.0–34.0)
MCHC: 34.3 g/dL (ref 30.0–36.0)
MCV: 89.4 fL (ref 78.0–100.0)
Platelets: 193 10*3/uL (ref 150–400)
RBC: 4.34 MIL/uL (ref 4.22–5.81)
RDW: 12.8 % (ref 11.5–15.5)
WBC: 8.4 10*3/uL (ref 4.0–10.5)

## 2015-01-16 LAB — BRAIN NATRIURETIC PEPTIDE: B Natriuretic Peptide: 14.8 pg/mL (ref 0.0–100.0)

## 2015-01-16 MED ORDER — SERTRALINE HCL 50 MG PO TABS
50.0000 mg | ORAL_TABLET | Freq: Every day | ORAL | Status: DC
  Administered 2015-01-17: 50 mg via ORAL
  Filled 2015-01-16: qty 1

## 2015-01-16 MED ORDER — HEPARIN SODIUM (PORCINE) 5000 UNIT/ML IJ SOLN
5000.0000 [IU] | Freq: Three times a day (TID) | INTRAMUSCULAR | Status: DC
  Administered 2015-01-16 – 2015-01-17 (×3): 5000 [IU] via SUBCUTANEOUS
  Filled 2015-01-16 (×4): qty 1

## 2015-01-16 MED ORDER — SODIUM CHLORIDE 0.9 % IJ SOLN
3.0000 mL | Freq: Two times a day (BID) | INTRAMUSCULAR | Status: DC
  Administered 2015-01-16: 3 mL via INTRAVENOUS

## 2015-01-16 MED ORDER — LISINOPRIL 20 MG PO TABS
20.0000 mg | ORAL_TABLET | Freq: Every day | ORAL | Status: DC
  Administered 2015-01-16 – 2015-01-17 (×2): 20 mg via ORAL
  Filled 2015-01-16 (×2): qty 1

## 2015-01-16 MED ORDER — POTASSIUM CHLORIDE CRYS ER 20 MEQ PO TBCR
40.0000 meq | EXTENDED_RELEASE_TABLET | Freq: Once | ORAL | Status: AC
  Administered 2015-01-16: 40 meq via ORAL
  Filled 2015-01-16: qty 2

## 2015-01-16 MED ORDER — ASPIRIN 81 MG PO CHEW
81.0000 mg | CHEWABLE_TABLET | Freq: Every day | ORAL | Status: DC
  Administered 2015-01-17: 81 mg via ORAL
  Filled 2015-01-16 (×2): qty 1

## 2015-01-16 MED ORDER — SODIUM CHLORIDE 0.9 % IV BOLUS (SEPSIS)
1000.0000 mL | Freq: Once | INTRAVENOUS | Status: AC
  Administered 2015-01-16: 1000 mL via INTRAVENOUS

## 2015-01-16 MED ORDER — IPRATROPIUM BROMIDE 0.02 % IN SOLN
0.5000 mg | Freq: Once | RESPIRATORY_TRACT | Status: AC
  Administered 2015-01-16: 0.5 mg via RESPIRATORY_TRACT
  Filled 2015-01-16: qty 2.5

## 2015-01-16 MED ORDER — NICOTINE 21 MG/24HR TD PT24
21.0000 mg | MEDICATED_PATCH | Freq: Every day | TRANSDERMAL | Status: DC
  Administered 2015-01-17: 21 mg via TRANSDERMAL
  Filled 2015-01-16: qty 1

## 2015-01-16 MED ORDER — POTASSIUM CHLORIDE 10 MEQ/100ML IV SOLN
10.0000 meq | INTRAVENOUS | Status: DC
  Administered 2015-01-16 (×3): 10 meq via INTRAVENOUS
  Filled 2015-01-16 (×3): qty 100

## 2015-01-16 MED ORDER — ALBUTEROL SULFATE (2.5 MG/3ML) 0.083% IN NEBU: 5.0000 mg | INHALATION_SOLUTION | Freq: Once | RESPIRATORY_TRACT | Status: DC

## 2015-01-16 MED ORDER — SODIUM CHLORIDE 0.9 % IV SOLN: 250.0000 mL | INTRAVENOUS | Status: DC | PRN

## 2015-01-16 MED ORDER — ALBUTEROL SULFATE (2.5 MG/3ML) 0.083% IN NEBU
5.0000 mg | INHALATION_SOLUTION | Freq: Once | RESPIRATORY_TRACT | Status: AC
  Administered 2015-01-16: 5 mg via RESPIRATORY_TRACT
  Filled 2015-01-16: qty 6

## 2015-01-16 MED ORDER — LORAZEPAM 1 MG PO TABS
1.0000 mg | ORAL_TABLET | Freq: Once | ORAL | Status: AC
  Administered 2015-01-16: 1 mg via ORAL
  Filled 2015-01-16: qty 1

## 2015-01-16 MED ORDER — SODIUM CHLORIDE 0.9 % IJ SOLN: 3.0000 mL | INTRAMUSCULAR | Status: DC | PRN

## 2015-01-16 MED ORDER — LISINOPRIL-HYDROCHLOROTHIAZIDE 20-25 MG PO TABS: 1.0000 | ORAL_TABLET | Freq: Every day | ORAL | Status: DC

## 2015-01-16 MED ORDER — BOOST PO LIQD
237.0000 mL | ORAL | Status: DC
  Administered 2015-01-16: 237 mL via ORAL
  Filled 2015-01-16 (×2): qty 237

## 2015-01-16 MED ORDER — BUDESONIDE 0.5 MG/2ML IN SUSP
0.5000 mg | Freq: Two times a day (BID) | RESPIRATORY_TRACT | Status: DC
  Administered 2015-01-16 – 2015-01-17 (×2): 0.5 mg via RESPIRATORY_TRACT
  Filled 2015-01-16 (×3): qty 2

## 2015-01-16 MED ORDER — GUAIFENESIN ER 600 MG PO TB12
600.0000 mg | ORAL_TABLET | Freq: Two times a day (BID) | ORAL | Status: DC | PRN
  Filled 2015-01-16: qty 1

## 2015-01-16 MED ORDER — METHYLPREDNISOLONE SODIUM SUCC 125 MG IJ SOLR
125.0000 mg | Freq: Once | INTRAMUSCULAR | Status: AC
  Administered 2015-01-16: 125 mg via INTRAVENOUS
  Filled 2015-01-16: qty 2

## 2015-01-16 MED ORDER — ACETAMINOPHEN 325 MG PO TABS: 650.0000 mg | ORAL_TABLET | Freq: Four times a day (QID) | ORAL | Status: DC | PRN

## 2015-01-16 MED ORDER — HYDROCHLOROTHIAZIDE 25 MG PO TABS
25.0000 mg | ORAL_TABLET | Freq: Every day | ORAL | Status: DC
  Administered 2015-01-17: 25 mg via ORAL
  Filled 2015-01-16: qty 1

## 2015-01-16 MED ORDER — ALBUTEROL SULFATE (2.5 MG/3ML) 0.083% IN NEBU
2.5000 mg | INHALATION_SOLUTION | RESPIRATORY_TRACT | Status: DC | PRN
  Administered 2015-01-16: 2.5 mg via RESPIRATORY_TRACT
  Filled 2015-01-16: qty 3

## 2015-01-16 MED ORDER — IPRATROPIUM BROMIDE 0.02 % IN SOLN: 0.5000 mg | Freq: Once | RESPIRATORY_TRACT | Status: DC

## 2015-01-16 MED ORDER — BUSPIRONE HCL 10 MG PO TABS
10.0000 mg | ORAL_TABLET | Freq: Two times a day (BID) | ORAL | Status: DC
  Administered 2015-01-16 – 2015-01-17 (×2): 10 mg via ORAL
  Filled 2015-01-16 (×3): qty 1

## 2015-01-16 MED ORDER — ACETAMINOPHEN 650 MG RE SUPP: 650.0000 mg | Freq: Four times a day (QID) | RECTAL | Status: DC | PRN

## 2015-01-16 MED ORDER — PREDNISONE 20 MG PO TABS
40.0000 mg | ORAL_TABLET | Freq: Every day | ORAL | Status: DC
  Administered 2015-01-17: 40 mg via ORAL
  Filled 2015-01-16 (×2): qty 2

## 2015-01-16 MED ORDER — AMOXICILLIN-POT CLAVULANATE 500-125 MG PO TABS
1.0000 | ORAL_TABLET | Freq: Three times a day (TID) | ORAL | Status: DC
  Administered 2015-01-16 – 2015-01-17 (×3): 500 mg via ORAL
  Filled 2015-01-16 (×6): qty 1

## 2015-01-16 MED ORDER — ALPRAZOLAM 0.5 MG PO TABS
0.5000 mg | ORAL_TABLET | Freq: Two times a day (BID) | ORAL | Status: DC
  Administered 2015-01-16 – 2015-01-17 (×2): 0.5 mg via ORAL
  Filled 2015-01-16 (×2): qty 1

## 2015-01-16 MED ORDER — IPRATROPIUM-ALBUTEROL 0.5-2.5 (3) MG/3ML IN SOLN
3.0000 mL | Freq: Four times a day (QID) | RESPIRATORY_TRACT | Status: DC
  Administered 2015-01-16 – 2015-01-17 (×4): 3 mL via RESPIRATORY_TRACT
  Filled 2015-01-16 (×4): qty 3

## 2015-01-16 NOTE — ED Notes (Signed)
Per EMS, pt coming in for SOB which was sudden onset. Pt has a hx of COPD and wears 4LNC at all times. Pt alertx4. NAD at this time. Pt received 5mg  of albuterol and .5of Atrovent in route which helped the SOB.

## 2015-01-16 NOTE — ED Notes (Signed)
MD Linker  notified that the pts potasium is 2.7.New orders placed.

## 2015-01-16 NOTE — Consult Note (Signed)
[facility] Admission History and Physical Service Pager: [phone]  Patient name: Hector House Medical record number: [id number] Date of birth: Dec 01, 1945 Age: 69 y.o. Gender: male  Primary Care Provider: [name], MD Consultants: none Code Status: DNR (discussed on admission)  Chief Complaint: SOB  Assessment and Plan: Hector House is a 69 y.o. male presenting with shortness of breath . PMH is significant for COPD, ETOH use, anxiety d/o, hypokalemia, HTN  Acute on chronic dyspnea 2/2 COPD exacerbation:  Used to see Dr Gwenette Greet.  VSS.  S/p 2 duonebs, methylprednisolone, Ativan in ED.  On 4L O2 saturating 94%.  CXR with no evidence of infection.  WBC 8.4.  SOB likely 2/2 underlying COPD but suspect that anxiety plays a role as well. -Place in observation under Dr Erin Hearing -VS per floor protocol -Duonebs q6, Pulmicort -Prednisone 40mg  qd x5 day total course -Augmentin 500mg  q8. -Would recommend close outpatient follow up with Pulmonology at discharge  Hypokalemia: K 2.7 in ED.  Hypokalemic at baseline.  Likely worsened by breathing treatments in ED.  Received 1 dose of KDur 77mEq in ED. -Repeat Kdur 40 mEq x1 now. -Check magnesium in am -Repeat BMET in am -Continue to replete PRN  Anxiety: on Xanax and Zoloft at home -Continue home medications  HTN: BP controlled on admission. On Lisinopril 20mg /HCTZ 25mg  at home. -resume home medications in am  Tobacco use: Reportedly stopped in January -Nicotine patch PRN  FEN/GI: HH diet, SLIV Prophylaxis: sub-q heparin  Disposition: Place in observation for COPD exacerbation.  Anticipate discharge tomorrow if stable.  History of Present Illness: Hector House is a 69 y.o. male presenting with SOB Patient reports that he has had increased SOB over the last 2-3 days.  He reports using his inhalers as directed but reports that he ran out of his Xanax about 3 days ago.  He states that he has had a cough.  He  reports that he has been using his albuterol around the clock, normally uses it several times daily at baseline.  Patient uses 3-3.5L O2 at home at baseline.  He reports weakness and SOB with ambulation.  Has not seen his pulmonologist since January because doctor no longer at that practice.  Denies fevers, chills, nausea, vomiting, diarrhea, sick contacts.    Review Of Systems: Per HPI with the following additions: none Otherwise 12 point review of systems was performed and was unremarkable.  Patient Active Problem List   Diagnosis Date Noted  . Acute exacerbation of chronic obstructive pulmonary disease (COPD) 12/11/2014  . Loss of weight 12/11/2014  . Acute on chronic respiratory failure 12/11/2014  . COPD with acute exacerbation   . Hypokalemia   . Underweight 06/24/2014  . Protein-calorie malnutrition, severe 11/02/2013  . Anxiety disorder due to medical condition 11/01/2013  . 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 COPD exacerbation 06/20/2012  . History of sarcoma of soft tissue 09/20/2011  . 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 Alcohol use 10/22/2009  . HYPERTENSION, BENIGN ESSENTIAL 09/25/2009  . COPD (chronic obstructive pulmonary disease) with emphysema 09/25/2009  . LOW BACK PAIN SYNDROME 09/25/2009   Past Medical History: Past Medical History  Diagnosis Date  . Hypertension   . Cancer     skin  . Arthritis   . Low back pain syndrome     Joint Pain  . Emphysema   . S/P radiation therapy 07/23/10 - 09/14/10    Right Thigh for Leiomyosarcoma / 61.2 Gy / 34  Fractions  . Leiomyosarcoma of thigh 2011  . Respiratory failure, acute 3/276/15 -11/04/13     Hospitalized  . Depression   . COPD (chronic obstructive pulmonary disease)    Past Surgical History: Past Surgical History  Procedure Laterality Date  . Laminectomy  2008    Three back surgeries per patient medical history form dated 05/06/10.  Marland Kitchen Elbow surgery  1967    L  . Leg skin lesion  biopsy / excision   04/20/10 / re-excision 06/18/10     Right thigh then re-excision of Lymph node right \\grion  06/18/10  . Back surgery    . Elbow surgery Left   . Left heart catheterization with coronary angiogram N/A 11/02/2013    Procedure: LEFT HEART CATHETERIZATION WITH CORONARY ANGIOGRAM;  Surgeon: Troy Sine, MD;  Location: [facility] CATH LAB;  Service: Cardiovascular;  Laterality: N/A;   Social History: History  Substance Use Topics  . Smoking status: Former Smoker -- 2.00 packs/day for 50 years    Types: Cigarettes    Quit date: 06/12/2014  . Smokeless tobacco: Never Used  . Alcohol Use: 1.8 oz/week    3 Cans of beer per week     Comment: Consumption per week.   Additional social history: no tobacco or ETOH since January, no drug use, lives alone  Please also refer to relevant sections of EMR.  Family History: Family History  Problem Relation Age of Onset  . Heart disease Mother     heart attack   Allergies and Medications: No Known Allergies No current facility-administered medications on file prior to encounter.   Current Outpatient Prescriptions on File Prior to Encounter  Medication Sig Dispense Refill  . albuterol (PROAIR HFA) 108 (90 BASE) MCG/ACT inhaler INHALE 2 PUFFS BY MOUTH EVERY 4 TO 6 HOURS AS NEEDED 18 g 2  . ALPRAZolam (XANAX) 0.5 MG tablet TAKE 1 TABLET BY MOUTH TWICE DAILY 12 tablet 0  . aspirin 81 MG tablet Take 1 tablet (81 mg total) by mouth daily. 30 tablet 5  . budesonide (PULMICORT) 0.5 MG/2ML nebulizer solution Take 2 mLs (0.5 mg total) by nebulization 2 (two) times daily. 2 mL 5  . busPIRone (BUSPAR) 10 MG tablet Take 1 tablet (10 mg total) by mouth 2 (two) times daily. 60 tablet 5  . doxycycline (VIBRA-TABS) 100 MG tablet Take 1 tablet (100 mg total) by mouth every 12 (twelve) hours. 9 tablet 0  . guaiFENesin (MUCINEX) 600 MG 12 hr tablet Take 1 tablet (600 mg total) by mouth 2 (two) times daily as needed. (Patient taking differently: Take 600 mg by mouth 2 (two)  times daily as needed for cough or to loosen phlegm. ) 20 tablet 2  . ipratropium-albuterol (DUONEB) 0.5-2.5 (3) MG/3ML SOLN Take 3 mLs by nebulization 4 (four) times daily. Can use an additional 2 treatments if needed. 360 mL 5  . lactose free nutrition (BOOST) LIQD Take 237 mLs by mouth daily.    Marland Kitchen lisinopril-hydrochlorothiazide (PRINZIDE,ZESTORETIC) 20-25 MG per tablet Take 1 tablet by mouth daily. 30 tablet 0  . nicotine (NICODERM CQ - DOSED IN MG/24 HOURS) 21 mg/24hr patch Place 1 patch (21 mg total) onto the skin daily. 30 patch 5  . potassium chloride 20 MEQ TBCR Take 40 mEq by mouth 2 (two) times daily. Take for 3 days. 6 tablet 0  . predniSONE (DELTASONE) 50 MG tablet 50 mg daily x 5 days. 5 tablet 0  . sertraline (ZOLOFT) 50 MG tablet Take 1 tablet (50  mg total) by mouth daily. 30 tablet 2    Objective: BP 108/79 mmHg  Pulse 97  Temp(Src) 98.1 F (36.7 C) (Oral)  Resp 21  SpO2 94% Exam: General: awake, alert, thin male, NAD HEENT: [place]/AT, EOMI, poor dentition, MMM Neck: supple Cardiovascular: RRR, no murmurs, +2 radial pulses Respiratory: diffuse expiratory wheeze with prolonged expiratory phase, supraclavicular accessory muscle use noted after ambulation, saturating 94% on 4L [place] Abdomen: flat, soft, NT/ND, +BS MSK: thin, moves extremities independently Skin: thin, dry, no rashes Neuro: AOx3, no focal deficits, follows commands Psych: speech normal, good eye contact, mood stable  Labs and Imaging: CBC BMET   Recent Labs Lab 01/16/15 1400  WBC 8.4  HGB 13.3  HCT 38.8*  PLT 193    Recent Labs Lab 01/16/15 1400  NA 129*  K 2.7*  CL 77*  CO2 36*  BUN 8  CREATININE 0.98  GLUCOSE 134*  CALCIUM 9.1     Dg Chest 2 View  01/16/2015   CLINICAL DATA:  69 year old male with a history of shortness of breath. No chest pain.  EXAM: CHEST - 2 VIEW  COMPARISON:  Multiple prior, most recent 12/13/2014, chest CT 11/03/2013  FINDINGS: Cardiomediastinal silhouette unchanged.  No evidence of pulmonary vascular congestion.  Volume loss of the right middle lobe, with opacity along the right heart border.  No pneumothorax or pleural effusion.  Stigmata of emphysema, with increased retrosternal airspace, flattened hemidiaphragms, increased AP diameter, and hyperinflation on the AP view.  EKG leads project on the chest wall.  No displaced fracture.  Unremarkable appearance of the upper abdomen.  IMPRESSION: No evidence of lobar pneumonia. There is partial volume loss involving the medial segment of the right middle lobe. This necessitates follow-up, and a chest x-ray in 1-2 weeks may be considered to assure resolution, or alternatively, chest CT may be completed at this time to evaluate for any possible obstructive hilar lesion.  Advanced emphysema.  Signed,  Dulcy Fanny. Earleen Newport, DO  [facility]  [facility]   Electronically Signed   By: Corrie Mckusick D.O.   On: 01/16/2015 15:00    Janora Norlander, DO 01/16/2015, 3:50 PM PGY-1, [place] [facility] pager: [phone], text pages welcome

## 2015-01-16 NOTE — Telephone Encounter (Signed)
Mrs. Hector House is calling on behalf of Mr. Hector House, who is currently in the hospital, because he needs a refill on ALPRAZolam Duanne Moron) . Please contact when/if ready. Thank you, Fonda Kinder, ASA

## 2015-01-16 NOTE — ED Provider Notes (Signed)
CSN: [id number]     Arrival date & time 01/16/15  1328 History   First MD Initiated Contact with Patient 01/16/15 1328     Chief Complaint  Patient presents with  . Shortness of Breath     (Consider location/radiation/quality/duration/timing/severity/associated sxs/prior Treatment) HPI  Pt with hx of COPD on home O2 presents with c/o shortness of breath and wheezing.  Pt presents via EMS, receiving neb treatment- he states this is helping him somewhat.  Pt states that worsening in his breathing occurred last night about 3am.  He has baseline shortness of breath but states this is much worse.  No chest pain.  No fever/chills.  Has had some productive cough.  No leg swelling.  He normally uses O2 3.5 Liters per [place].  There are no other associated systemic symptoms, there are no other alleviating or modifying factors.   Past Medical History  Diagnosis Date  . Hypertension   . Cancer     skin  . Arthritis   . Low back pain syndrome     Joint Pain  . Emphysema   . S/P radiation therapy 07/23/10 - 09/14/10    Right Thigh for Leiomyosarcoma / 61.2 Gy / 34 Fractions  . Leiomyosarcoma of thigh 2011  . Respiratory failure, acute 3/276/15 -11/04/13     Hospitalized  . Depression   . COPD (chronic obstructive pulmonary disease)    Past Surgical History  Procedure Laterality Date  . Laminectomy  2008    Three back surgeries per patient medical history form dated 05/06/10.  Marland Kitchen Elbow surgery  1967    L  . Leg skin lesion  biopsy / excision  04/20/10 / re-excision 06/18/10     Right thigh then re-excision of Lymph node right \\grion  06/18/10  . Back surgery    . Elbow surgery Left   . Left heart catheterization with coronary angiogram N/A 11/02/2013    Procedure: LEFT HEART CATHETERIZATION WITH CORONARY ANGIOGRAM;  Surgeon: Troy Sine, MD;  Location: [facility] CATH LAB;  Service: Cardiovascular;  Laterality: N/A;   Family History  Problem Relation Age of Onset  . Heart disease Mother     heart  attack   History  Substance Use Topics  . Smoking status: Former Smoker -- 2.00 packs/day for 50 years    Types: Cigarettes    Quit date: 06/12/2014  . Smokeless tobacco: Never Used  . Alcohol Use: 1.8 oz/week    3 Cans of beer per week     Comment: Consumption per week.    Review of Systems  ROS reviewed and all otherwise negative except for mentioned in HPI    Allergies  Review of patient's allergies indicates no known allergies.  Home Medications   Prior to Admission medications   Medication Sig Start Date End Date Taking? Authorizing Provider  albuterol (PROAIR HFA) 108 (90 BASE) MCG/ACT inhaler INHALE 2 PUFFS BY MOUTH EVERY 4 TO 6 HOURS AS NEEDED 12/16/14   Sharon [address], MD  ALPRAZolam Duanne Moron) 0.5 MG tablet TAKE 1 TABLET BY MOUTH TWICE DAILY 01/07/15   Mariel Aloe, MD  aspirin 81 MG tablet Take 1 tablet (81 mg total) by mouth daily. 12/21/13   Mariel Aloe, MD  budesonide (PULMICORT) 0.5 MG/2ML nebulizer solution Take 2 mLs (0.5 mg total) by nebulization 2 (two) times daily. 12/21/13   Mariel Aloe, MD  busPIRone (BUSPAR) 10 MG tablet Take 1 tablet (10 mg total) by mouth 2 (two) times daily. 10/11/14  Mariel Aloe, MD  doxycycline (VIBRA-TABS) 100 MG tablet Take 1 tablet (100 mg total) by mouth every 12 (twelve) hours. 12/11/14   Vivi Barrack, MD  guaiFENesin (MUCINEX) 600 MG 12 hr tablet Take 1 tablet (600 mg total) by mouth 2 (two) times daily as needed. Patient taking differently: Take 600 mg by mouth 2 (two) times daily as needed for cough or to loosen phlegm.  12/21/13   Mariel Aloe, MD  ipratropium-albuterol (DUONEB) 0.5-2.5 (3) MG/3ML SOLN Take 3 mLs by nebulization 4 (four) times daily. Can use an additional 2 treatments if needed. 06/07/14   Kathee Delton, MD  lactose free nutrition (BOOST) LIQD Take 237 mLs by mouth daily.    Historical Provider, MD  lisinopril-hydrochlorothiazide (PRINZIDE,ZESTORETIC) 20-25 MG per tablet Take 1 tablet by mouth daily.  12/26/14   Patrecia Pour, MD  nicotine (NICODERM CQ - DOSED IN MG/24 HOURS) 21 mg/24hr patch Place 1 patch (21 mg total) onto the skin daily. 07/17/14   Mariel Aloe, MD  potassium chloride 20 MEQ TBCR Take 40 mEq by mouth 2 (two) times daily. Take for 3 days. 12/17/14   [place], MD  predniSONE (DELTASONE) 50 MG tablet 50 mg daily x 5 days. 12/10/14   Coral Spikes, DO  sertraline (ZOLOFT) 50 MG tablet Take 1 tablet (50 mg total) by mouth daily. 12/11/14   Mariel Aloe, MD   BP 108/79 mmHg  Pulse 97  Temp(Src) 98.1 F (36.7 C) (Oral)  Resp 21  SpO2 94%  Vitals reviewed Physical Exam  Physical Examination: General appearance - alert, chronically ill appearing, and in no distress Mental status - alert, oriented to person, place, and time Eyes - no conjunctival injection, no scleral icterus Mouth - mucous membranes moist, pharynx normal without lesions Chest - BSS, diminished throught with expiratory wheezing, increased respiratory effort, speaking in 2-3 word sentences Heart - normal rate, regular rhythm, normal S1, S2, no murmurs, rubs, clicks or gallops Abdomen - soft, nontender, nondistended, no masses or organomegaly Neurological - alert, orientedx  3 Extremities - peripheral pulses normal, no pedal edema, no clubbing or cyanosis Skin - normal coloration and turgor, no rashes Psych- anxious but cooperative  ED Course  Procedures (including critical care time)  CRITICAL CARE Performed by: Threasa Beards Total critical care time: 40 Critical care time was exclusive of separately billable procedures and treating other patients. Critical care was necessary to treat or prevent imminent or life-threatening deterioration. Critical care was time spent personally by me on the following activities: development of treatment plan with patient and/or surrogate as well as nursing, discussions with consultants, evaluation of patient's response to treatment, examination of patient,  obtaining history from patient or surrogate, ordering and performing treatments and interventions, ordering and review of laboratory studies, ordering and review of radiographic studies, pulse oximetry and re-evaluation of patient's condition. Labs Review Labs Reviewed  CBC - Abnormal; Notable for the following:    HCT 38.8 (*)    All other components within normal limits  BASIC METABOLIC PANEL - Abnormal; Notable for the following:    Sodium 129 (*)    Potassium 2.7 (*)    Chloride 77 (*)    CO2 36 (*)    Glucose, Bld 134 (*)    Anion gap 16 (*)    All other components within normal limits  BRAIN NATRIURETIC PEPTIDE    Imaging Review Dg Chest 2 View  01/16/2015   CLINICAL DATA:  69 year old male with a history of shortness of breath. No chest pain.  EXAM: CHEST - 2 VIEW  COMPARISON:  Multiple prior, most recent 12/13/2014, chest CT 11/03/2013  FINDINGS: Cardiomediastinal silhouette unchanged. No evidence of pulmonary vascular congestion.  Volume loss of the right middle lobe, with opacity along the right heart border.  No pneumothorax or pleural effusion.  Stigmata of emphysema, with increased retrosternal airspace, flattened hemidiaphragms, increased AP diameter, and hyperinflation on the AP view.  EKG leads project on the chest wall.  No displaced fracture.  Unremarkable appearance of the upper abdomen.  IMPRESSION: No evidence of lobar pneumonia. There is partial volume loss involving the medial segment of the right middle lobe. This necessitates follow-up, and a chest x-ray in 1-2 weeks may be considered to assure resolution, or alternatively, chest CT may be completed at this time to evaluate for any possible obstructive hilar lesion.  Advanced emphysema.  Signed,  Dulcy Fanny. Earleen Newport, DO  [facility]  [facility]   Electronically Signed   By: Corrie Mckusick D.O.   On: 01/16/2015 15:00     EKG Interpretation   Date/Time:  Thursday January 16 2015  13:37:06 EDT Ventricular Rate:  107 PR Interval:  156 QRS Duration: 108 QT Interval:  392 QTC Calculation: 523 R Axis:   -86 Text Interpretation:  Sinus tachycardia Biatrial enlargement LAD, consider  left anterior fascicular block Low voltage, precordial leads Abnormal  R-wave progression, early transition Prolonged QT interval Since previous  tracing QT interval has lengthened Confirmed by Canary Brim  MD, MARTHA [phone])  on 01/16/2015 2:24:54 PM      MDM   Final diagnoses:  COPD exacerbation  Hypokalemia    Pt presenting with shortness of breath, workup and presentation most c/w COPD exacerbation.   Pt with continued wheezing after 3 duonebs in the ED.  He has received IV solumedrol as well.  He also continues to have significant anxiety with treatments- given ativan 1mg  po.    pt is hypokalemic which was repleted both IV and po.  3:41 PM d/w family practice, they will see patient in the ED for admission.    Alfonzo Beers, MD 01/17/15 1049

## 2015-01-16 NOTE — Progress Notes (Signed)
Patient trasfered from ED to [phone] via stretcher; alert and oriented x 4; no complaints of pain; IV in RFA running potasium@100cc ;Orient patient to room and unit; gave patient care guide; instructed how to use the call bell and  fall risk precautions. Will continue to monitor the patient.

## 2015-01-17 ENCOUNTER — Telehealth: Payer: Self-pay | Admitting: Family Medicine

## 2015-01-17 ENCOUNTER — Other Ambulatory Visit: Payer: Self-pay | Admitting: Family Medicine

## 2015-01-17 DIAGNOSIS — E876 Hypokalemia: Secondary | ICD-10-CM

## 2015-01-17 DIAGNOSIS — J441 Chronic obstructive pulmonary disease with (acute) exacerbation: Principal | ICD-10-CM

## 2015-01-17 LAB — BASIC METABOLIC PANEL
ANION GAP: 11 (ref 5–15)
BUN: 9 mg/dL (ref 6–20)
CO2: 33 mmol/L — AB (ref 22–32)
Calcium: 8.8 mg/dL — ABNORMAL LOW (ref 8.9–10.3)
Chloride: 88 mmol/L — ABNORMAL LOW (ref 101–111)
Creatinine, Ser: 0.71 mg/dL (ref 0.61–1.24)
Glucose, Bld: 125 mg/dL — ABNORMAL HIGH (ref 65–99)
Potassium: 3.4 mmol/L — ABNORMAL LOW (ref 3.5–5.1)
Sodium: 132 mmol/L — ABNORMAL LOW (ref 135–145)

## 2015-01-17 LAB — CBC
HCT: 33.7 % — ABNORMAL LOW (ref 39.0–52.0)
HEMOGLOBIN: 11.5 g/dL — AB (ref 13.0–17.0)
MCH: 30.1 pg (ref 26.0–34.0)
MCHC: 34.1 g/dL (ref 30.0–36.0)
MCV: 88.2 fL (ref 78.0–100.0)
Platelets: 184 10*3/uL (ref 150–400)
RBC: 3.82 MIL/uL — AB (ref 4.22–5.81)
RDW: 12.9 % (ref 11.5–15.5)
WBC: 4.6 10*3/uL (ref 4.0–10.5)

## 2015-01-17 LAB — MAGNESIUM: MAGNESIUM: 1.8 mg/dL (ref 1.7–2.4)

## 2015-01-17 MED ORDER — LISINOPRIL-HYDROCHLOROTHIAZIDE 20-25 MG PO TABS: 0.5000 | ORAL_TABLET | Freq: Every day | ORAL | Status: DC

## 2015-01-17 MED ORDER — AMOXICILLIN-POT CLAVULANATE 500-125 MG PO TABS: 1.0000 | ORAL_TABLET | Freq: Three times a day (TID) | ORAL | Status: AC

## 2015-01-17 MED ORDER — ALPRAZOLAM 0.5 MG PO TABS: 0.5000 mg | ORAL_TABLET | Freq: Two times a day (BID) | ORAL | Status: DC

## 2015-01-17 MED ORDER — PREDNISONE 20 MG PO TABS: 40.0000 mg | ORAL_TABLET | Freq: Every day | ORAL | Status: AC

## 2015-01-17 MED ORDER — POTASSIUM CHLORIDE CRYS ER 20 MEQ PO TBCR
40.0000 meq | EXTENDED_RELEASE_TABLET | Freq: Once | ORAL | Status: AC
  Administered 2015-01-17: 40 meq via ORAL
  Filled 2015-01-17: qty 2

## 2015-01-17 MED ORDER — IPRATROPIUM-ALBUTEROL 0.5-2.5 (3) MG/3ML IN SOLN: 3.0000 mL | Freq: Four times a day (QID) | RESPIRATORY_TRACT | Status: DC

## 2015-01-17 MED ORDER — POTASSIUM CHLORIDE CRYS ER 20 MEQ PO TBCR
20.0000 meq | EXTENDED_RELEASE_TABLET | Freq: Once | ORAL | Status: AC
  Administered 2015-01-17: 20 meq via ORAL
  Filled 2015-01-17: qty 1

## 2015-01-17 NOTE — Progress Notes (Signed)
Pt given discharge instructions and PIV removed.  Pt denied any needs at this time.  Pt taken to discharge location via wheelchair.

## 2015-01-17 NOTE — Evaluation (Signed)
Physical Therapy Evaluation Patient Details Name: Hector House MRN: [id number] DOB: 1946-02-26 Today's Date: 01/17/2015   History of Present Illness  Pt adm with copd exacerbation.   Clinical Impression  Pt admitted with above diagnosis and presents to PT with functional limitations due to deficits listed below (See PT problem list). Pt needs skilled PT to maximize independence and safety to allow discharge to home due to pt does not want SNF due to wanting to return home to his dog. Pt very sedentary at home due to respiratory limitations. Feel long term pt could benefit from ALF.     Follow Up Recommendations Home health PT (Pt does not want SNF due to not wanting to leave his dog.)    Equipment Recommendations  None recommended by PT    Recommendations for Other Services       Precautions / Restrictions Precautions Precautions: Fall      Mobility  Bed Mobility Overal bed mobility: Modified Independent                Transfers Overall transfer level: Needs assistance Equipment used: Rolling walker (2 wheeled) Transfers: Sit to/from Stand Sit to Stand: Supervision         General transfer comment: assist for safety  Ambulation/Gait Ambulation/Gait assistance: Min guard Ambulation Distance (Feet): 10 Feet Assistive device: Rolling walker (2 wheeled) Gait Pattern/deviations: Step-through pattern;Decreased step length - right;Decreased step length - left   Gait velocity interpretation: Below normal speed for age/gender General Gait Details: Shaky and unsteady but no loss of balance. Pt on 4L of O2 for amb with SaO2 92%  Stairs            Wheelchair Mobility    Modified Rankin (Stroke Patients Only)       Balance Overall balance assessment: Needs assistance Sitting-balance support: No upper extremity supported;Feet supported Sitting balance-Leahy Scale: Good     Standing balance support: No upper extremity supported Standing balance-Leahy  Scale: Fair                               Pertinent Vitals/Pain Pain Assessment: No/denies pain    Home Living Family/patient expects to be discharged to:: Private residence Living Arrangements: Alone Available Help at Discharge: Friend(s) Type of Home: House Home Access: Stairs to enter   [employer] of Steps: 2 Home Layout: One level Home Equipment: Environmental consultant - 2 wheels;Cane - single point;Bedside commode;Shower seat;Wheelchair - manual      Prior Function Level of Independence: Independent with assistive device(s)         Comments: Very limited activity tolerance and spends a lot of time in bed.     Hand Dominance   Dominant Hand: Right    Extremity/Trunk Assessment   Upper Extremity Assessment: Defer to OT evaluation           Lower Extremity Assessment: Generalized weakness         Communication   Communication: No difficulties  Cognition Arousal/Alertness: Awake/alert Behavior During Therapy: Anxious Overall Cognitive Status: Within Functional Limits for tasks assessed                      General Comments      Exercises        Assessment/Plan    PT Assessment Patient needs continued PT services  PT Diagnosis Difficulty walking;Generalized weakness   PT Problem List    PT Treatment Interventions DME instruction;Gait  training;Functional mobility training;Therapeutic activities;Therapeutic exercise;Balance training;Patient/family education   PT Goals (Current goals can be found in the Care Plan section) Acute Rehab PT Goals Patient Stated Goal: return home to dog PT Goal Formulation: With patient Time For Goal Achievement: 01/24/15 Potential to Achieve Goals: Good    Frequency Min 3X/week   Barriers to discharge Decreased caregiver support Has friends to check in intermittently.    Co-evaluation               End of Session Equipment Utilized During Treatment: Oxygen Activity Tolerance: Other  (comment) (Limited by dyspnea) Patient left: in chair;with call bell/phone within reach;with chair alarm set Nurse Communication: Mobility status    Functional Assessment Tool Used: clinical judgment Functional Limitation: Mobility: Walking and moving around Mobility: Walking and Moving Around Current Status (M0102): At least 20 percent but less than 40 percent impaired, limited or restricted Mobility: Walking and Moving Around Goal Status [phone]): At least 1 percent but less than 20 percent impaired, limited or restricted    Time: 0953-1014 PT Time Calculation (min) (ACUTE ONLY): 21 min   Charges:   PT Evaluation $Initial PT Evaluation Tier I: 1 Procedure     PT G Codes:   PT G-Codes **NOT FOR INPATIENT CLASS** Functional Assessment Tool Used: clinical judgment Functional Limitation: Mobility: Walking and moving around Mobility: Walking and Moving Around Current Status (U4403): At least 20 percent but less than 40 percent impaired, limited or restricted Mobility: Walking and Moving Around Goal Status [phone]): At least 1 percent but less than 20 percent impaired, limited or restricted    [facility] 01/17/2015, 10:42 AM  [place]

## 2015-01-17 NOTE — Discharge Instructions (Signed)
You were admitted for a COPD exacerbation.  You will take augmentin and prednisone for the next 4 days for this.  Please take all of the pills.  Chronic Obstructive Pulmonary Disease Chronic obstructive pulmonary disease (COPD) is a common lung condition in which airflow from the lungs is limited. COPD is a general term that can be used to describe many different lung problems that limit airflow, including both chronic bronchitis and emphysema. If you have COPD, your lung function will probably never return to normal, but there are measures you can take to improve lung function and make yourself feel better.  CAUSES   Smoking (common).   Exposure to secondhand smoke.   Genetic problems.  Chronic inflammatory lung diseases or recurrent infections. SYMPTOMS   Shortness of breath, especially with physical activity.   Deep, persistent (chronic) cough with a large amount of thick mucus.   Wheezing.   Rapid breaths (tachypnea).   Gray or bluish discoloration (cyanosis) of the skin, especially in fingers, toes, or lips.   Fatigue.   Weight loss.   Frequent infections or episodes when breathing symptoms become much worse (exacerbations).   Chest tightness. DIAGNOSIS  Your health care provider will take a medical history and perform a physical examination to make the initial diagnosis. Additional tests for COPD may include:   Lung (pulmonary) function tests.  Chest X-ray.  CT scan.  Blood tests. TREATMENT  Treatment available to help you feel better when you have COPD includes:   Inhaler and nebulizer medicines. These help manage the symptoms of COPD and make your breathing more comfortable.  Supplemental oxygen. Supplemental oxygen is only helpful if you have a low oxygen level in your blood.   Exercise and physical activity. These are beneficial for nearly all people with COPD. Some people may also benefit from a pulmonary rehabilitation program. HOME CARE  INSTRUCTIONS   Take all medicines (inhaled or pills) as directed by your health care provider.  Avoid over-the-counter medicines or cough syrups that dry up your airway (such as antihistamines) and slow down the elimination of secretions unless instructed otherwise by your health care provider.   If you are a smoker, the most important thing that you can do is stop smoking. Continuing to smoke will cause further lung damage and breathing trouble. Ask your health care provider for help with quitting smoking. He or she can direct you to community resources or hospitals that provide support.  Avoid exposure to irritants such as smoke, chemicals, and fumes that aggravate your breathing.  Use oxygen therapy and pulmonary rehabilitation if directed by your health care provider. If you require home oxygen therapy, ask your health care provider whether you should purchase a pulse oximeter to measure your oxygen level at home.   Avoid contact with individuals who have a contagious illness.  Avoid extreme temperature and humidity changes.  Eat healthy foods. Eating smaller, more frequent meals and resting before meals may help you maintain your strength.  Stay active, but balance activity with periods of rest. Exercise and physical activity will help you maintain your ability to do things you want to do.  Preventing infection and hospitalization is very important when you have COPD. Make sure to receive all the vaccines your health care provider recommends, especially the pneumococcal and influenza vaccines. Ask your health care provider whether you need a pneumonia vaccine.  Learn and use relaxation techniques to manage stress.  Learn and use controlled breathing techniques as directed by your health care  provider. Controlled breathing techniques include:   Pursed lip breathing. Start by breathing in (inhaling) through your nose for 1 second. Then, purse your lips as if you were going to whistle  and breathe out (exhale) through the pursed lips for 2 seconds.   Diaphragmatic breathing. Start by putting one hand on your abdomen just above your waist. Inhale slowly through your nose. The hand on your abdomen should move out. Then purse your lips and exhale slowly. You should be able to feel the hand on your abdomen moving in as you exhale.   Learn and use controlled coughing to clear mucus from your lungs. Controlled coughing is a series of short, progressive coughs. The steps of controlled coughing are:  1. Lean your head slightly forward.  2. Breathe in deeply using diaphragmatic breathing.  3. Try to hold your breath for 3 seconds.  4. Keep your mouth slightly open while coughing twice.  5. Spit any mucus out into a tissue.  6. Rest and repeat the steps once or twice as needed. SEEK MEDICAL CARE IF:   You are coughing up more mucus than usual.   There is a change in the color or thickness of your mucus.   Your breathing is more labored than usual.   Your breathing is faster than usual.  SEEK IMMEDIATE MEDICAL CARE IF:   You have shortness of breath while you are resting.   You have shortness of breath that prevents you from:  Being able to talk.   Performing your usual physical activities.   You have chest pain lasting longer than 5 minutes.   Your skin color is more cyanotic than usual.  You measure low oxygen saturations for longer than 5 minutes with a pulse oximeter. MAKE SURE YOU:   Understand these instructions.  Will watch your condition.  Will get help right away if you are not doing well or get worse. Document Released: 05/05/2005 Document Revised: 12/10/2013 Document Reviewed: 03/22/2013 [facility] Patient Information 2015 [place], [place]. This information is not intended to replace advice given to you by your health care provider. Make sure you discuss any questions you have with your health care provider.

## 2015-01-17 NOTE — Progress Notes (Signed)
SATURATION QUALIFICATIONS: (This note is used to comply with regulatory documentation for home oxygen)  Patient Saturations on Room Air at Rest = 87%  Patient Saturations on Room Air while Ambulating = 85%  Patient Saturations on 3 Liters of oxygen while Ambulating = 95%  Please briefly explain why patient needs home oxygen: Pt has COPD and oxygen dependent.  Currently has oxygen at home.

## 2015-01-17 NOTE — Discharge Summary (Signed)
[facility] Discharge Summary  Patient name: Hector House Medical record number: [id number] Date of birth: 1945-08-19 Age: 69 y.o. Gender: male Date of Admission: 01/16/2015  Date of Discharge: 01/17/2015  Admitting Physician: Lind Covert, MD  Primary Care Provider: [name], MD Consultants: none  Indication for Hospitalization: COPD exacerbation  Discharge Diagnoses/Problem List:  Acute on chronic dyspnea 2/2 COPD exacerbation Hypokalemia Anxiety HTN H/o Tobacco use  Disposition: home with HHPT  Discharge Condition: improved  Discharge Exam: see progress note from day of discharge  Brief Hospital Course: Hector House is a 69 y.o. male presenting with shortness of breath . PMH is significant for COPD, ETOH use, anxiety d/o, hypokalemia, HTN.  VSS and afebrile, but requiring 4L O2 (home 3-3.5L) on admission. CXR with no evidence of infection and WBC 8.4. It is likely that anxiety played a large role in dyspnea as well, as patient had run out of Xanax at home prior to admission.  Home pulmicort was continued and duonebs were scheduled q6h. COPD exacerbation treated with Prednisone 40mg  qd x5 days and augmentin q8h x5d.  It was noted that during hospitalization, while taking home Lisinopril-HCTZ 20/25mg  daily, patient had intermittenly hypotension to 09F systolic.  Home dose of antihypertensives were halved (10/12.5mg  daily) on discharge.  All other chronic medical conditions stable throughout admission and managed with home regimens. Electrolytes repleted prn.  Issues for Follow Up:  - f/u respiratory status - f/u anxiety - could consider klonopin instead of xanax - f/u BP - continue to encourage patient to come to follow-up appointments - f/u tremor - consider primidone - think about [facility] for home assistance - resistant to SNF placement  Significant Procedures: none  Significant Labs and Imaging:   Recent Labs Lab 01/16/15 1400  01/17/15 0346  WBC 8.4 4.6  HGB 13.3 11.5*  HCT 38.8* 33.7*  PLT 193 184    Recent Labs Lab 01/16/15 1400 01/17/15 0346  NA 129* 132*  K 2.7* 3.4*  CL 77* 88*  CO2 36* 33*  GLUCOSE 134* 125*  BUN 8 9  CREATININE 0.98 0.71  CALCIUM 9.1 8.8*  MG  --  1.8    Dg Chest 2 View 01/16/2015 IMPRESSION: No evidence of lobar pneumonia. There is partial volume loss involving the medial segment of the right middle lobe. This necessitates follow-up, and a chest x-ray in 1-2 weeks may be considered to assure resolution, or alternatively, chest CT may be completed at this time to evaluate for any possible obstructive hilar lesion. Advanced emphysema.   Results/Tests Pending at Time of Discharge: none  Discharge Medications:    Medication List    TAKE these medications        albuterol 108 (90 BASE) MCG/ACT inhaler  Commonly known as:  PROAIR HFA  INHALE 2 PUFFS BY MOUTH EVERY 4 TO 6 HOURS AS NEEDED     ALPRAZolam 0.5 MG tablet  Commonly known as:  XANAX  Take 1 tablet (0.5 mg total) by mouth 2 (two) times daily.     amoxicillin-clavulanate 500-125 MG per tablet  Commonly known as:  AUGMENTIN  Take 1 tablet (500 mg total) by mouth every 8 (eight) hours.     aspirin 81 MG tablet  Take 1 tablet (81 mg total) by mouth daily.     budesonide 0.5 MG/2ML nebulizer solution  Commonly known as:  PULMICORT  Take 2 mLs (0.5 mg total) by nebulization 2 (two) times daily.     busPIRone  10 MG tablet  Commonly known as:  BUSPAR  Take 1 tablet (10 mg total) by mouth 2 (two) times daily.     guaiFENesin 600 MG 12 hr tablet  Commonly known as:  MUCINEX  Take 1 tablet (600 mg total) by mouth 2 (two) times daily as needed.     ipratropium-albuterol 0.5-2.5 (3) MG/3ML Soln  Commonly known as:  DUONEB  Take 3 mLs by nebulization 4 (four) times daily. Can use an additional 2 treatments if needed.     lactose free nutrition Liqd  Take 237 mLs by mouth daily.      lisinopril-hydrochlorothiazide 20-25 MG per tablet  Commonly known as:  PRINZIDE,ZESTORETIC  Take 0.5 tablets by mouth daily.     predniSONE 20 MG tablet  Commonly known as:  DELTASONE  Take 2 tablets (40 mg total) by mouth daily with breakfast.  Start taking on:  01/18/2015     sertraline 50 MG tablet  Commonly known as:  ZOLOFT  Take 1 tablet (50 mg total) by mouth daily.        Discharge Instructions: Please refer to Patient Instructions section of EMR for full details.  Patient was counseled important signs and symptoms that should prompt return to medical care, changes in medications, dietary instructions, activity restrictions, and follow up appointments.   Follow-Up Appointments: Follow-up Information    Follow up with Thersa Salt, DO On 01/23/2015.   Specialty:  Family Medicine   Why:  For hospital follow-up, appointment made at 8:45am   Contact information:   [place] [id number] [phone]       Virginia Crews, MD 01/17/2015, 2:47 PM PGY-1, [place]

## 2015-01-17 NOTE — Evaluation (Signed)
Occupational Therapy Evaluation and Discharge Patient Details Name: Hector House MRN: [id number] DOB: [birth year] Today's Date: 01/17/2015    History of Present Illness Pt adm with copd exacerbation.    Clinical Impression   This 69 yo male admitted with above presents to acute OT at a Mod I level for BADLs (needing increased time due to increased WOB).    Follow Up Recommendations  No OT follow up    Equipment Recommendations  None recommended by OT       Precautions / Restrictions Precautions Precautions: Fall Restrictions Weight Bearing Restrictions: No      Mobility Bed Mobility Overal bed mobility: Modified Independent                Transfers Overall transfer level: Modified independent Equipment used: Rolling walker (2 wheeled) Transfers: Sit to/from Stand                     ADL Overall ADL's : Modified independent                                             Vision Additional Comments: No change from baseline          Pertinent Vitals/Pain Pain Assessment: No/denies pain     Hand Dominance Right   Extremity/Trunk Assessment Upper Extremity Assessment Upper Extremity Assessment: Overall WFL for tasks assessed           Communication Communication Communication: No difficulties   Cognition Arousal/Alertness: Awake/alert Behavior During Therapy: WFL for tasks assessed/performed Overall Cognitive Status:  (Pt wanting to go home and has D/C orders in chart. Spoke with sister who said she could come get him but did not have an O2 tank with her--pt initially thought he would be fine just to get to his house which he reports is only 5 minute drive away without need for his 4 liters of O2. Found out in next sentence that he does not have a house key and neither does his sister. After pt laid back down he reported that he had re-thought trying to get home without O2 on since when he got up with me with O2 on it took a  lot of out him and that he needed to wait until someone could come get him that had his portable O2 tank with them.                                Home Living Family/patient expects to be discharged to:: Private residence Living Arrangements: Alone Available Help at Discharge: Friend(s);Available PRN/intermittently Type of Home: House Home Access: Stairs to enter [employer] of Steps: 2   Home Layout: One level     Bathroom Shower/Tub: Tub/shower unit         Home Equipment: Environmental consultant - 2 wheels;Cane - single point;Bedside commode;Shower seat;Wheelchair - manual          Prior Functioning/Environment Level of Independence: Independent with assistive device(s)        Comments: Very limited activity tolerance and spends a lot of time in bed or seated    OT Diagnosis: Generalized weakness         OT Goals(Current goals can be found in the care plan section) Acute Rehab OT Goals Patient Stated Goal: return home to dog today  OT Frequency:                End of Session Equipment Utilized During Treatment: Oxygen (4 liters) Nurse Communication:  (sister to call RN about pt's friend's Cathy's number)  Activity Tolerance:  (pt with increased WOB) Patient left: in bed;with call bell/phone within reach   Time: 1833-5825 OT Time Calculation (min): 13 min Charges:  OT General Charges $OT Visit: 1 Procedure OT Evaluation $Initial OT Evaluation Tier I: 1 Procedure  Almon Register [phone] 01/17/2015, 4:07 PM

## 2015-01-17 NOTE — Telephone Encounter (Signed)
Patient's friend or relative (name [name]) called stating that his Xanax was not refilled. Dr. Brita Romp, who discharged the patient, ordered the medication on discharged and it was confirmed by the RN on 5W that this was physically given to the patient. Advised Ms. Rosana Hoes that he should take the medication to get it filled.  Another RX was not given/called in.

## 2015-01-17 NOTE — Progress Notes (Signed)
Family Medicine Teaching Service Daily Progress Note Intern Pager: [phone]  Patient name: Hector House Medical record number: [id number] Date of birth: November 16, 1945 Age: 69 y.o. Gender: male  Primary Care Provider: [name], MD Consultants: none Code Status: DNR (discussed on admission)  Pt Overview and Major Events to Date:  6/9 - admit to FPTS for COPD exacerbation  Assessment and Plan: Hector House is a 69 y.o. male presenting with shortness of breath . PMH is significant for COPD, ETOH use, anxiety d/o, hypokalemia, HTN  Acute on chronic dyspnea 2/2 COPD exacerbation:  VSS. On 4L O2 saturating 94% on admission (home O2 3-3.5L). CXR with no evidence of infection. WBC 8.4. SOB likely 2/2 underlying COPD but suspect that anxiety plays a role as well. -Duonebs q6, home Pulmicort -Prednisone 40mg  qd x5 day total course (6/9>>) -Augmentin 500mg  q8 (6/9>>) -Would recommend close outpatient follow up with Pulmonology at discharge  Hypokalemia: K 2.7 in ED > 3.4 this AM. Hypokalemic at baseline. Likely worsened by breathing treatments. s/p 2 doses of KDur 35mEq. Mag wnl at 1.8. - Continue to monitor and replete PRN - KDur 87mEq now  Anxiety: on Xanax and Zoloft at home -Continue home medications  HTN: BP intermittently soft to 90s/50s O/N.  - On home Lisinopril 20mg /HCTZ 25mg  - consider decreasing dose while hospitalized  Tobacco use: Reportedly stopped in January -Nicotine patch PRN  FEN/GI: HH diet, SLIV Prophylaxis: sub-q heparin  Disposition: floor status, d/c pending return to home O2, possibly today  Subjective:  Reports that breathing has improved back to baseline.  Thinks that this may have been caused by anxiety after running out of Xanax  Objective: Temp:  [97.8 F (36.6 C)-98.3 F (36.8 C)] 98.3 F (36.8 C) (06/10 0507) Pulse Rate:  [87-104] 88 (06/10 0507) Resp:  [16-22] 18 (06/10 0507) BP: (81-136)/(49-96) 126/55 mmHg (06/10 0507) SpO2:  [94 %-100  %] 99 % (06/10 0754) FiO2 (%):  [4 %] 4 % (06/09 1845) Weight:  [97 lb 7.1 oz (44.2 kg)] 97 lb 7.1 oz (44.2 kg) (06/10 0600) Physical Exam: General: awake, alert, thin male, NAD HEENT: [place]/AT, EOMI, poor dentition, MMM Cardiovascular: RRR, no murmurs, +2 radial pulses Respiratory: mild diffuse expiratory wheeze with prolonged expiratory phase, [place] in place, normal WOB Abdomen: flat, soft, NT/ND, +BS MSK: thin, moves extremities independently Skin: thin, dry, no rashes Neuro: AOx3, no focal deficits, follows commands Psych: speech normal, good eye contact, mood stable  Laboratory:  Recent Labs Lab 01/16/15 1400 01/17/15 0346  WBC 8.4 4.6  HGB 13.3 11.5*  HCT 38.8* 33.7*  PLT 193 184    Recent Labs Lab 01/16/15 1400 01/17/15 0346  NA 129* 132*  K 2.7* 3.4*  CL 77* 88*  CO2 36* 33*  BUN 8 9  CREATININE 0.98 0.71  CALCIUM 9.1 8.8*  GLUCOSE 134* 125*    Imaging/Diagnostic Tests: Dg Chest 2 View 01/16/2015    IMPRESSION: No evidence of lobar pneumonia. There is partial volume loss involving the medial segment of the right middle lobe. This necessitates follow-up, and a chest x-ray in 1-2 weeks may be considered to assure resolution, or alternatively, chest CT may be completed at this time to evaluate for any possible obstructive hilar lesion.  Advanced emphysema.     Virginia Crews, MD 01/17/2015, 8:21 AM PGY-1, [place] Intern pager: [phone], text pages welcome

## 2015-01-18 ENCOUNTER — Other Ambulatory Visit: Payer: Self-pay | Admitting: Family Medicine

## 2015-01-18 MED ORDER — ALPRAZOLAM 0.5 MG PO TABS: 0.5000 mg | ORAL_TABLET | Freq: Two times a day (BID) | ORAL | Status: DC

## 2015-01-18 NOTE — Telephone Encounter (Signed)
[facility] Emergency Line  Received a call from Ms. Rosana Hoes, who is this patient's care provider. She states that he was never given a prescription for Xanax on discharge, and if he was then he has now lost it. I called patient's pharmacy to see if this script had been filled. It had not. A verbal order for Xanax 0.5mg  BID #60 was placed. Specific instructions were given to the pharmacist (?Suezanne Jacquet) to withhold filling the original printed prescription if this happens to resurface. Ms. Rosana Hoes was contacted and informed of the prescription waiting for Mr. Bulthuis.  Elberta Leatherwood, MD,MS,  PGY1 01/18/2015 1:02 PM

## 2015-01-18 NOTE — Progress Notes (Signed)
Spoke with Esmond Plants from [place] after updated from [place] with [facility] that patient is currently followed by [facility] and not [facility]. Patient stated to CM that he was active with [facility] when asked for planning his home health follow up. Sheppard Evens confirmed  Patient was active, and that [facility] would accept the referral to continue services. Also spoke with Sheppard Evens about pt's home oxygen supplier. Patient stated that he received O2 from [facility], but Tiffany from [facility] stated that he was not a current client. Patient's family did arrive with oxygen from home to hospital for transportation from hospital to home at time of discharge, confirming patient had oxygen at home for use after discharge. Sheppard Evens agreed to have [facility] RN assess oxygen supply.

## 2015-01-22 ENCOUNTER — Other Ambulatory Visit: Payer: Self-pay | Admitting: Family Medicine

## 2015-01-23 ENCOUNTER — Ambulatory Visit: Payer: Medicare Other | Admitting: Family Medicine

## 2015-01-30 ENCOUNTER — Other Ambulatory Visit: Payer: Self-pay | Admitting: Family Medicine

## 2015-02-03 ENCOUNTER — Other Ambulatory Visit: Payer: Self-pay | Admitting: *Deleted

## 2015-02-03 MED ORDER — ALBUTEROL SULFATE HFA 108 (90 BASE) MCG/ACT IN AERS: INHALATION_SPRAY | RESPIRATORY_TRACT | Status: AC

## 2015-02-04 ENCOUNTER — Encounter: Payer: Self-pay | Admitting: Pulmonary Disease

## 2015-02-04 ENCOUNTER — Ambulatory Visit: Payer: Medicare Other | Admitting: Pulmonary Disease

## 2015-02-04 ENCOUNTER — Ambulatory Visit (INDEPENDENT_AMBULATORY_CARE_PROVIDER_SITE_OTHER): Payer: Medicare Other | Admitting: Pulmonary Disease

## 2015-02-04 DIAGNOSIS — J432 Centrilobular emphysema: Secondary | ICD-10-CM | POA: Diagnosis not present

## 2015-02-04 MED ORDER — BOOST HIGH PROTEIN PO LIQD: 1.0000 | Freq: Two times a day (BID) | ORAL | Status: AC

## 2015-02-04 MED ORDER — FORMOTEROL FUMARATE 20 MCG/2ML IN NEBU: 20.0000 ug | INHALATION_SOLUTION | Freq: Two times a day (BID) | RESPIRATORY_TRACT | Status: DC

## 2015-02-04 NOTE — Progress Notes (Signed)
Subjective:    Patient ID: Hector House, male    DOB: 08/30/1945, 69 y.o.   MRN: [id number]  Synopsis: Former patient of my partner who has very severe COPD with recurrent exacerbations.  PFT's 2011:  FEV1 1.07 (33%), ratio 35, DLCO 52 Ambul oximetry 08/2011:  Ok  ONO 06/2013:  Low sat 78%, but only 71min entire night less than 89%.   HPI Chief Complaint  Patient presents with  . Follow-up    Former [place] pt- pt c/o sob with exertion. pt is unsure of how to use his POC.  sp02 was 78% on arrival on 3lp concentrator.   [name] is here with his neighbor [name] who takes care of him at home and gets him to [employer]. He was hospitalized twice since May, but he says that he has been in 3-4 times this year. He says that his breathing has been getting more difficult, and anxiety makes him significantly worse. He is using albuterol several times per day and goes through a proAir inhaler in 2 weeks. He brings up a lot of mucus which is grey.  Doesn't really have chest pain or leg swelling. He was recently prescribed oxygen concentrator but he doesn't know how to use it.   Past Medical History  Diagnosis Date  . Hypertension   . Low back pain syndrome     Joint Pain  . Emphysema   . S/P radiation therapy 07/23/10 - 09/14/10    Right Thigh for Leiomyosarcoma / 61.2 Gy / 34 Fractions  . Respiratory failure, acute 3/276/15 -11/04/13     Hospitalized  . Depression   . COPD (chronic obstructive pulmonary disease)   . 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 Pneumonia 1979  . Headache     "1-2/wk" (01/16/2015)  . Arthritis     "hands" (01/16/2015)  . Chronic back pain   . Anxiety       Review of Systems  Constitutional: Positive for fatigue. Negative for fever and chills.  HENT: Negative for postnasal drip, rhinorrhea and sinus pressure.   Respiratory: Positive for cough, shortness of breath and wheezing.   Cardiovascular: Negative for chest pain, palpitations and leg swelling.        Objective:   Physical Exam Filed Vitals:   02/04/15 1024  BP: 122/82  Pulse: 93  Height: 5\' 7"  (1.702 m)  Weight: 93 lb (42.185 kg)  SpO2: 94%   Gen: chronically ill and chachectic appearing HENT: OP clear, TM's clear, neck supple PULM: poor air movement, no wheezing, some basilar crackles CV: RRR, no mgr, trace edema GI: BS+, soft, nontender Derm: no cyanosis or rash Psyche: normal mood and affect    Recent hospital records reviewed from May and June 2016 when he was admitted for a COPD exacerbation, treated with steroid, and discharged with a home portable oxygen concentrator 01/2015 CXR images reviewed> severe emphysema bilaterally      Assessment & Plan:  COPD (chronic obstructive pulmonary disease) with emphysema He has very severe COPD and has a poor prognosis considering the multiple recent exacerbations. His ongoing tobacco use is contributing to this.  Further, his protein calorie malnutrition adds complexity and contributes to his overall poor prognosis.  Plan: Quit smoking, he was counseled at length to quit today We will consult our social worker through our local accountable care organization to help with his social situation and hopefully get him assistance for protein and nutrition shakes. Add long-acting beta agonist  in the form of formoterol twice a day nebulized Continue DuoNeb 4 times a day Continue Pulmicort twice a day Continue albuterol as needed Follow-up in 6-8 weeks or sooner if needed  Protein-calorie malnutrition, severe This problem is clearly contributing to his poor prognosis. It is primarily due to his severe COPD.  Plan: We'll arrange social work to help with his protein calorie malnutrition and help provide protein shakes.     Current outpatient prescriptions:  .  albuterol (PROAIR HFA) 108 (90 BASE) MCG/ACT inhaler, INHALE 2 PUFFS BY MOUTH EVERY 4 TO 6 HOURS AS NEEDED, Disp: 18 g, Rfl: 2 .  ALPRAZolam (XANAX) 0.5 MG tablet,  Take 1 tablet (0.5 mg total) by mouth 2 (two) times daily., Disp: 60 tablet, Rfl: 0 .  aspirin 81 MG tablet, Take 1 tablet (81 mg total) by mouth daily., Disp: 30 tablet, Rfl: 5 .  budesonide (PULMICORT) 0.5 MG/2ML nebulizer solution, Take 2 mLs (0.5 mg total) by nebulization 2 (two) times daily., Disp: 2 mL, Rfl: 5 .  busPIRone (BUSPAR) 10 MG tablet, Take 1 tablet (10 mg total) by mouth 2 (two) times daily., Disp: 60 tablet, Rfl: 5 .  guaiFENesin (MUCINEX) 600 MG 12 hr tablet, Take 1 tablet (600 mg total) by mouth 2 (two) times daily as needed. (Patient taking differently: Take 600 mg by mouth 2 (two) times daily as needed for cough or to loosen phlegm. ), Disp: 20 tablet, Rfl: 2 .  ipratropium-albuterol (DUONEB) 0.5-2.5 (3) MG/3ML SOLN, Take 3 mLs by nebulization 4 (four) times daily. Can use an additional 2 treatments if needed., Disp: 360 mL, Rfl: 0 .  lactose free nutrition (BOOST) LIQD, Take 237 mLs by mouth daily., Disp: , Rfl:  .  lisinopril-hydrochlorothiazide (PRINZIDE,ZESTORETIC) 20-25 MG per tablet, Take 0.5 tablets by mouth daily., Disp: 30 tablet, Rfl: 0 .  sertraline (ZOLOFT) 50 MG tablet, TAKE 1 TABLET BY MOUTH DAILY, Disp: 90 tablet, Rfl: 2 .  formoterol (PERFOROMIST) 20 MCG/2ML nebulizer solution, Take 2 mLs (20 mcg total) by nebulization 2 (two) times daily., Disp: 120 mL, Rfl: 5

## 2015-02-04 NOTE — Patient Instructions (Signed)
Continue using your oxygen as you're doing, 24 hours a day Quit smoking Take the new nebulizer medicine called formoterol twice a day in addition to all your other medications Continue taking the rest of your medications as prescribed Try to increase her calorie intake, protein shakes are helpful We will ask a social worker to help assist with your medication management and help get she some protein shakes We will see you back in 6-8 weeks or sooner if needed

## 2015-02-04 NOTE — Assessment & Plan Note (Signed)
This problem is clearly contributing to his poor prognosis. It is primarily due to his severe COPD.  Plan: We'll arrange social work to help with his protein calorie malnutrition and help provide protein shakes.

## 2015-02-04 NOTE — Assessment & Plan Note (Addendum)
He has very severe COPD and has a poor prognosis considering the multiple recent exacerbations. His ongoing tobacco use is contributing to this.  Further, his protein calorie malnutrition adds complexity and contributes to his overall poor prognosis.  Plan: Quit smoking, he was counseled at length to quit today We will consult our social worker through our local accountable care organization to help with his social situation and hopefully get him assistance for protein and nutrition shakes. Add long-acting beta agonist in the form of formoterol twice a day nebulized Continue DuoNeb 4 times a day Continue Pulmicort twice a day Continue albuterol as needed Follow-up in 6-8 weeks or sooner if needed

## 2015-02-05 ENCOUNTER — Telehealth: Payer: Self-pay

## 2015-02-05 ENCOUNTER — Ambulatory Visit: Payer: Medicare Other | Admitting: Family Medicine

## 2015-02-05 NOTE — Telephone Encounter (Signed)
Patient calledJackie and said that he was on his way in to his appointment and his oxygen gave out.  He passed out as a result and was taken back into his house and hooked up to oxygen. Dr. Jonathon Jordan schedule is full this afternoon but patient wanted to know if he could be seen if he could get his portable oxygen tank refilled today. I asked Dr. Lacinda Axon and he said that he could see him but that our concern was that he should probably go to the emergency room since he had passed out. [name] said that she would reschedule the patient for 02/06/2015.[name]

## 2015-02-06 ENCOUNTER — Other Ambulatory Visit: Payer: Self-pay | Admitting: Family Medicine

## 2015-02-06 ENCOUNTER — Ambulatory Visit: Payer: Medicare Other | Admitting: Family Medicine

## 2015-02-06 ENCOUNTER — Telehealth: Payer: Self-pay | Admitting: Family Medicine

## 2015-02-06 NOTE — Telephone Encounter (Signed)
Pt called because he was not able to get here today. He has a lot of issues. He said that he needs his xanax called in. He also said that [place] is suppose to be visiting him and they have not been out in while. He wanted to know why they are not coming. He has made an appointment for 7/7 and hopes to be able to make it. jw

## 2015-02-06 NOTE — Telephone Encounter (Signed)
Lilian Kapur, RN from [place] also calls. She states that patient's anxiety level is through the roof. She believes patient needs a increased dosage of Xanax. 1 mg daily is not doing anything for his anxiety level. She also states he only has about 3 day worth of meds left. Any questions call [place]onia at [phone].

## 2015-02-07 NOTE — Telephone Encounter (Signed)
Patient needs an appointment for a refill of his Xanax. This medication has been refilled many times without him making appointments. He has cancelled 5 appointments since May.

## 2015-02-07 NOTE — Telephone Encounter (Signed)
Patient informed that Rx for Xanax will not be filled until he is seen by his PCP.  He has missed/cancelled several appts.  Need to be seen before any medication refills given.  Appt 02/12/15 at 9:30 AM.  Pt stated he will be at that appt because he need his medication.  Derl Barrow, RN

## 2015-02-08 ENCOUNTER — Other Ambulatory Visit: Payer: Self-pay | Admitting: Internal Medicine

## 2015-02-08 ENCOUNTER — Telehealth: Payer: Self-pay | Admitting: Internal Medicine

## 2015-02-08 MED ORDER — VALSARTAN-HYDROCHLOROTHIAZIDE 160-25 MG PO TABS: 1.0000 | ORAL_TABLET | Freq: Every day | ORAL | Status: DC

## 2015-02-08 MED ORDER — IPRATROPIUM-ALBUTEROL 0.5-2.5 (3) MG/3ML IN SOLN: 3.0000 mL | Freq: Four times a day (QID) | RESPIRATORY_TRACT | Status: DC

## 2015-02-08 NOTE — Telephone Encounter (Signed)
Caretaker cathy davis confused with instructions and reports pt cough/ wheezing on acei   rec Stop acei indefinitely > changed to diovan 160/25 daily Change to duoneb qid   Will need to use DME company for the performist/budesonide as this is Part B medicare and see what we can do when office opens to expedite this

## 2015-02-10 ENCOUNTER — Encounter (HOSPITAL_COMMUNITY): Payer: Self-pay | Admitting: Emergency Medicine

## 2015-02-10 ENCOUNTER — Inpatient Hospital Stay (HOSPITAL_COMMUNITY)
Admission: EM | Admit: 2015-02-10 | Discharge: 2015-02-13 | DRG: 190 | Disposition: A | Discharge status: Home Health | Payer: Medicare Other | Attending: Family Medicine | Admitting: Family Medicine

## 2015-02-10 ENCOUNTER — Emergency Department (HOSPITAL_COMMUNITY): Payer: Medicare Other

## 2015-02-10 DIAGNOSIS — J441 Chronic obstructive pulmonary disease with (acute) exacerbation: Secondary | ICD-10-CM | POA: Diagnosis present

## 2015-02-10 DIAGNOSIS — Z923 Personal history of irradiation: Secondary | ICD-10-CM

## 2015-02-10 DIAGNOSIS — E43 Unspecified severe protein-calorie malnutrition: Secondary | ICD-10-CM | POA: Diagnosis present

## 2015-02-10 DIAGNOSIS — Z681 Body mass index (BMI) 19 or less, adult: Secondary | ICD-10-CM

## 2015-02-10 DIAGNOSIS — I10 Essential (primary) hypertension: Secondary | ICD-10-CM | POA: Diagnosis present

## 2015-02-10 DIAGNOSIS — R636 Underweight: Secondary | ICD-10-CM

## 2015-02-10 DIAGNOSIS — Z87891 Personal history of nicotine dependence: Secondary | ICD-10-CM

## 2015-02-10 DIAGNOSIS — L8990 Pressure ulcer of unspecified site, unspecified stage: Secondary | ICD-10-CM | POA: Insufficient documentation

## 2015-02-10 DIAGNOSIS — M545 Low back pain: Secondary | ICD-10-CM | POA: Diagnosis present

## 2015-02-10 DIAGNOSIS — F329 Major depressive disorder, single episode, unspecified: Secondary | ICD-10-CM | POA: Diagnosis present

## 2015-02-10 DIAGNOSIS — Z85831 Personal history of malignant neoplasm of soft tissue: Secondary | ICD-10-CM

## 2015-02-10 DIAGNOSIS — J9602 Acute respiratory failure with hypercapnia: Secondary | ICD-10-CM | POA: Insufficient documentation

## 2015-02-10 DIAGNOSIS — Z8249 Family history of ischemic heart disease and other diseases of the circulatory system: Secondary | ICD-10-CM

## 2015-02-10 DIAGNOSIS — E872 Acidosis: Secondary | ICD-10-CM | POA: Diagnosis present

## 2015-02-10 DIAGNOSIS — R64 Cachexia: Secondary | ICD-10-CM | POA: Diagnosis present

## 2015-02-10 DIAGNOSIS — Z7982 Long term (current) use of aspirin: Secondary | ICD-10-CM

## 2015-02-10 DIAGNOSIS — F064 Anxiety disorder due to known physiological condition: Secondary | ICD-10-CM

## 2015-02-10 DIAGNOSIS — Z79899 Other long term (current) drug therapy: Secondary | ICD-10-CM

## 2015-02-10 HISTORY — DX: Dependence on supplemental oxygen: Z99.81

## 2015-02-10 LAB — BASIC METABOLIC PANEL
ANION GAP: 9 (ref 5–15)
BUN: 7 mg/dL (ref 6–20)
CHLORIDE: 92 mmol/L — AB (ref 101–111)
CO2: 35 mmol/L — ABNORMAL HIGH (ref 22–32)
CREATININE: 0.7 mg/dL (ref 0.61–1.24)
Calcium: 8.5 mg/dL — ABNORMAL LOW (ref 8.9–10.3)
GFR calc Af Amer: >60 mL/min (ref >60)
GLUCOSE: 126 mg/dL — AB (ref 65–99)
Potassium: 3.2 mmol/L — ABNORMAL LOW (ref 3.5–5.1)
SODIUM: 136 mmol/L (ref 135–145)

## 2015-02-10 LAB — I-STAT TROPONIN, ED: Troponin i, poc: 0 ng/mL (ref 0.00–0.08)

## 2015-02-10 LAB — CBC WITH DIFFERENTIAL/PLATELET
Basophils Absolute: 0 10*3/uL (ref 0.0–0.1)
Basophils Relative: 0 % (ref 0–1)
EOS ABS: 0 10*3/uL (ref 0.0–0.7)
EOS PCT: 1 % (ref 0–5)
HEMATOCRIT: 36.6 % — AB (ref 39.0–52.0)
HEMOGLOBIN: 12.1 g/dL — AB (ref 13.0–17.0)
LYMPHS PCT: 16 % (ref 12–46)
Lymphs Abs: 1.4 10*3/uL (ref 0.7–4.0)
MCH: 30.1 pg (ref 26.0–34.0)
MCHC: 33.1 g/dL (ref 30.0–36.0)
MCV: 91 fL (ref 78.0–100.0)
MONOS PCT: 7 % (ref 3–12)
Monocytes Absolute: 0.6 10*3/uL (ref 0.1–1.0)
NEUTROS ABS: 6.5 10*3/uL (ref 1.7–7.7)
NEUTROS PCT: 76 % (ref 43–77)
Platelets: 174 10*3/uL (ref 150–400)
RBC: 4.02 MIL/uL — ABNORMAL LOW (ref 4.22–5.81)
RDW: 13.3 % (ref 11.5–15.5)
WBC: 8.6 10*3/uL (ref 4.0–10.5)

## 2015-02-10 LAB — I-STAT ARTERIAL BLOOD GAS, ED
Acid-Base Excess: 11 mmol/L — ABNORMAL HIGH (ref 0.0–2.0)
BICARBONATE: 40.2 meq/L — AB (ref 20.0–24.0)
O2 SAT: 99 %
PCO2 ART: 74.7 mmHg — AB (ref 35.0–45.0)
PO2 ART: 171 mmHg — AB (ref 80.0–100.0)
TCO2: 42 mmol/L (ref 0–100)
pH, Arterial: 7.339 — ABNORMAL LOW (ref 7.350–7.450)

## 2015-02-10 LAB — BRAIN NATRIURETIC PEPTIDE: B Natriuretic Peptide: 141.6 pg/mL — ABNORMAL HIGH (ref 0.0–100.0)

## 2015-02-10 MED ORDER — DEXTROSE 5 % IV SOLN
500.0000 mg | Freq: Once | INTRAVENOUS | Status: AC
  Administered 2015-02-10: 500 mg via INTRAVENOUS
  Filled 2015-02-10: qty 500

## 2015-02-10 MED ORDER — SODIUM CHLORIDE 0.9 % IV BOLUS (SEPSIS)
1000.0000 mL | Freq: Once | INTRAVENOUS | Status: AC
  Administered 2015-02-10: 1000 mL via INTRAVENOUS

## 2015-02-10 MED ORDER — LORAZEPAM 2 MG/ML IJ SOLN
1.0000 mg | Freq: Once | INTRAMUSCULAR | Status: AC
  Administered 2015-02-10: 1 mg via INTRAVENOUS
  Filled 2015-02-10: qty 1

## 2015-02-10 MED ORDER — DEXTROSE 5 % IV SOLN
1.0000 g | INTRAVENOUS | Status: DC
  Filled 2015-02-10: qty 10

## 2015-02-10 MED ORDER — ALBUTEROL (5 MG/ML) CONTINUOUS INHALATION SOLN
10.0000 mg/h | INHALATION_SOLUTION | Freq: Once | RESPIRATORY_TRACT | Status: AC
  Administered 2015-02-10: 10 mg/h via RESPIRATORY_TRACT
  Filled 2015-02-10: qty 20

## 2015-02-10 NOTE — ED Provider Notes (Signed)
CSN: [id number]     Arrival date & time 02/10/15  2139 History   First MD Initiated Contact with Patient 02/10/15 2142     Chief Complaint  Patient presents with  . Shortness of Breath   (Consider location/radiation/quality/duration/timing/severity/associated sxs/prior Treatment) Patient is a 69 y.o. male presenting with shortness of breath. The history is provided by the EMS personnel and the patient.  Shortness of Breath Severity:  Severe Onset quality:  Gradual Timing:  Constant Progression:  Worsening Chronicity:  Recurrent Context: activity   Relieved by:  Nothing Worsened by:  Activity and exertion Ineffective treatments:  Oxygen and inhaler Associated symptoms: cough and wheezing   Associated symptoms: no abdominal pain, no chest pain, no fever, no headaches, no rash, no sputum production and no vomiting     Past Medical History  Diagnosis Date  . Hypertension   . Low back pain syndrome     Joint Pain  . Emphysema   . S/P radiation therapy 07/23/10 - 09/14/10    Right Thigh for Leiomyosarcoma / 61.2 Gy / 34 Fractions  . Respiratory failure, acute 3/276/15 -11/04/13     Hospitalized  . Depression   . COPD (chronic obstructive pulmonary disease)   . 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 Pneumonia 1979  . Headache     "1-2/wk" (01/16/2015)  . Arthritis     "hands" (01/16/2015)  . Chronic back pain   . Anxiety    Past Surgical History  Procedure Laterality Date  . Elbow fracture surgery Left ~ 1964  . Leg skin lesion  biopsy / excision Right 04/20/10; 06/18/10    leiomyosarcoma; inner thigh   . Back surgery  X 4  . Elbow surgery Left   . Left heart catheterization with coronary angiogram N/A 11/02/2013    Procedure: LEFT HEART CATHETERIZATION WITH CORONARY ANGIOGRAM;  Surgeon: Troy Sine, MD;  Location: [facility] C[facility];  Service: Cardiovascular;  Laterality: N/A;  . Cardiac catheterization    . Lymph node dissection Right 06/2010    groin/notes 06/19/2010  .  Repair dural / csf leak  01/06/2007; 01/19/2007    lumbar/notes 12/09/2010  . Lumbar laminectomy/decompression microdiscectomy  ~ 11/2006; 01/19/2007    Archie Endo 12/09/2010  . Colonoscopy w/ biopsies and polypectomy     Family History  Problem Relation Age of Onset  . Heart disease Mother     heart attack   History  Substance Use Topics  . Smoking status: Former Smoker -- 2.00 packs/day for 51 years    Types: Cigarettes    Quit date: 08/09/2014  . Smokeless tobacco: Never Used  . Alcohol Use: Yes     Comment: 01/16/2015 "stopped drinking in Jan"     Review of Systems  Constitutional: Negative for fever and fatigue.  Respiratory: Positive for cough, chest tightness, shortness of breath and wheezing. Negative for sputum production.   Cardiovascular: Negative for chest pain.  Gastrointestinal: Negative for nausea, vomiting and abdominal pain.  Skin: Negative for rash.  Neurological: Negative for speech difficulty, light-headedness and headaches.  Psychiatric/Behavioral: Negative for confusion.  All other systems reviewed and are negative.     Allergies  Review of patient's allergies indicates no known allergies.  Home Medications   Prior to Admission medications   Medication Sig Start Date End Date Taking? Authorizing Provider  albuterol (PROAIR HFA) 108 (90 BASE) MCG/ACT inhaler INHALE 2 PUFFS BY MOUTH EVERY 4 TO 6 HOURS AS NEEDED 02/03/15  Yes Evalee Jefferson  Lonny Prude, MD  ALPRAZolam Duanne Moron) 0.5 MG tablet Take 1 tablet (0.5 mg total) by mouth 2 (two) times daily. 01/18/15  Yes Elberta Leatherwood, MD  busPIRone (BUSPAR) 10 MG tablet Take 1 tablet (10 mg total) by mouth 2 (two) times daily. 10/11/14  Yes Mariel Aloe, MD  sertraline (ZOLOFT) 50 MG tablet TAKE 1 TABLET BY MOUTH DAILY 01/31/15  Yes Mariel Aloe, MD  aspirin 81 MG tablet Take 1 tablet (81 mg total) by mouth daily. 12/21/13   Mariel Aloe, MD  budesonide (PULMICORT) 0.5 MG/2ML nebulizer solution Take 2 mLs (0.5 mg total) by nebulization 2  (two) times daily. 12/21/13   Mariel Aloe, MD  feeding supplement (BOOST HIGH PROTEIN) LIQD Take 237 mLs by mouth 2 (two) times daily between meals. 02/04/15   Juanito Doom, MD  formoterol (PERFOROMIST) 20 MCG/2ML nebulizer solution Take 2 mLs (20 mcg total) by nebulization 2 (two) times daily. 02/04/15   Juanito Doom, MD  guaiFENesin (MUCINEX) 600 MG 12 hr tablet Take 1 tablet (600 mg total) by mouth 2 (two) times daily as needed. Patient taking differently: Take 600 mg by mouth 2 (two) times daily as needed for cough or to loosen phlegm.  12/21/13   Mariel Aloe, MD  ipratropium-albuterol (DUONEB) 0.5-2.5 (3) MG/3ML SOLN Take 3 mLs by nebulization 4 (four) times daily. Can use an additional 2 treatments if needed. 02/08/15   Tanda Rockers, MD  lactose free nutrition (BOOST) LIQD Take 237 mLs by mouth daily.    Historical Provider, MD  valsartan-hydrochlorothiazide (DIOVAN HCT) 160-25 MG per tablet Take 1 tablet by mouth daily. 02/08/15   Tanda Rockers, MD    ED Triage Vitals  Enc Vitals Group     BP 02/10/15 2154 108/64 mmHg     Pulse Rate 02/10/15 2146 95     Resp 02/10/15 2146 17     Temp --      Temp src --      SpO2 02/10/15 2146 100 %     Weight 02/10/15 2154 93 lb (42.185 kg)     Height 02/10/15 2154 5\' 7"  (1.702 m)     Head Cir --      Peak Flow --      Pain Score --      Pain Loc --      Pain Edu? --      Excl. in [place]? --    Physical Exam  Constitutional: He is oriented to person, place, and time. He appears well-developed and well-nourished. He appears listless. He appears toxic. He appears distressed. Face mask in place.  HENT:  Head: Normocephalic and atraumatic.  Nose: Nose normal.  Mouth/Throat: Oropharynx is clear and moist. No oropharyngeal exudate.  Eyes: EOM are normal. Pupils are equal, round, and reactive to light.  Neck: Normal range of motion. Neck supple.  Cardiovascular: Normal rate, regular rhythm, normal heart sounds and intact distal pulses.    No murmur heard. Pulmonary/Chest: He is in respiratory distress. He has wheezes. He exhibits no tenderness.  Significantly decreased aeration bilaterally with mild end expiratory wheezing.  Increased work of breathing and + accessory muscle use  Abdominal: Soft. He exhibits no distension. There is no tenderness. There is no guarding.  Musculoskeletal: Normal range of motion. He exhibits no tenderness.  Neurological: He is oriented to person, place, and time. He appears listless. No cranial nerve deficit. Coordination normal.  Skin: Skin is warm and dry. He is  not diaphoretic. No pallor.  Psychiatric: His behavior is normal. Judgment and thought content normal. His mood appears anxious.  Nursing note and vitals reviewed.   ED Course  Procedures (including critical care time) Labs Review Labs Reviewed  BASIC METABOLIC PANEL - Abnormal; Notable for the following:    Potassium 3.2 (*)    Chloride 92 (*)    CO2 35 (*)    Glucose, Bld 126 (*)    Calcium 8.5 (*)    All other components within normal limits  CBC WITH DIFFERENTIAL/PLATELET - Abnormal; Notable for the following:    RBC 4.02 (*)    Hemoglobin 12.1 (*)    HCT 36.6 (*)    All other components within normal limits  BRAIN NATRIURETIC PEPTIDE - Abnormal; Notable for the following:    B Natriuretic Peptide 141.6 (*)    All other components within normal limits  CBC - Abnormal; Notable for the following:    RBC 3.77 (*)    Hemoglobin 11.3 (*)    HCT 34.4 (*)    Platelets 142 (*)    All other components within normal limits  BASIC METABOLIC PANEL - Abnormal; Notable for the following:    Potassium 3.2 (*)    Chloride 96 (*)    Glucose, Bld 126 (*)    Creatinine, Ser 0.58 (*)    Calcium 8.4 (*)    All other components within normal limits  I-STAT ARTERIAL BLOOD GAS, ED - Abnormal; Notable for the following:    pH, Arterial 7.339 (*)    pCO2 arterial 74.7 (*)    pO2, Arterial 171.0 (*)    Bicarbonate 40.2 (*)     Acid-Base Excess 11.0 (*)    All other components within normal limits  MRSA PCR SCREENING  CULTURE, BLOOD (ROUTINE X 2)  CULTURE, BLOOD (ROUTINE X 2)  BLOOD GAS, ARTERIAL  I-STAT TROPOININ, ED    Imaging Review Dg Chest Port 1 View  02/10/2015   CLINICAL DATA:  Shortness of breath.  History of emphysema  EXAM: PORTABLE CHEST - 1 VIEW  COMPARISON:  01/16/2015  FINDINGS: Hyperinflation and emphysematous change. There is no edema, consolidation, effusion, or pneumothorax. Normal heart size and mediastinal contours.  IMPRESSION: Emphysema without acute superimposed disease.   Electronically Signed   By: Monte Fantasia M.D.   On: 02/10/2015 22:03     EKG Interpretation   Date/Time:  Monday February 10 2015 21:46:10 EDT Ventricular Rate:  94 PR Interval:  151 QRS Duration: 83 QT Interval:  348 QTC Calculation: 435 R Axis:   -92 Text Interpretation:  Sinus rhythm Right atrial enlargement Right superior  axis Low voltage, precordial leads Anteroseptal infarct, age indeterminate  No significant change since last tracing Confirmed by Maryan Rued  MD,  Loree Fee ([id number]) on 02/10/2015 9:56:15 PM      MDM   Final diagnoses:  COPD exacerbation  Acute respiratory failure with hypercapnia   Pt is a 69 yo M with hx of COPD who presented in COPD exacerbation.  On 3L O2 by [place] at baseline.  Can only walk ~10 feet without SOB.  Complains of 2 weeks of progressive SOB.  Today it acutely worsened and was not improved with albuterol so he called 911.  First responders arrived and increased his O2, but unclear what his sats were.  Given 2 x duonebs, 2 mg IV mag, and solumedrol by EMS then started on BiPAP with continuous albuterol.    Placed on our bipap facemask with continuous  albuterol on arrival to the ED.  Was speaking in normal sentences but pausing for some breaths.  Anxious on the BiPAP.  Moving very little air bilaterally and mild end expiratory wheezing.  Good O2 sats on BiPAP.    Given NS bolus and  azithromycin.   Didn't tolerate staying on bipap due to anxiety, so changed over to continuous albuterol by face mask for ~ 1 hr.   ABG returned showing significant hypercarbia with CO2 70.  Given ativan and put back on BiPAP mask, tolerating it better this time.  CXR with no opacities, looks like COPD.    Called for admission to family medicine resident team.  Triaged to medsurg bed.    If performed, labs, EKGs, and imaging were reviewed and interpreted by myself and my attending, and incorporated in the medical decision making.  Patient was seen with ED Attending, Dr. Dorma Russell, MD     Tori Milks, MD 02/12/15 [place], MD 02/13/15 1558

## 2015-02-10 NOTE — ED Notes (Signed)
Pt was at home, when he became short of breath. Pt tried an Albuterol treatment, but got no relief. Pt called 911. Upon EMS arrival, pt was very short of breath with audible wheezing and diminished lung sounds. Pt was placed on CPAP and given the following medications: 125 mg of Solumedrol, 2 grams Magnesium, 10 mg of Albuterol, and 1 mg of Atrovent. Pt showed some improvement upon arrival to the hospital.

## 2015-02-10 NOTE — Progress Notes (Signed)
Placed patient back on BIPAP following CAT and Ativan due to increased anxiety. Patient is tolerating much better and RT will continue to monitor.

## 2015-02-10 NOTE — ED Notes (Signed)
MD at bedside. 

## 2015-02-10 NOTE — Progress Notes (Signed)
Notified Dr. Debara Pickett of critical CO2 level on ABG. Continuing CAT until it is finished and will return to BIPAP

## 2015-02-11 ENCOUNTER — Telehealth: Payer: Self-pay | Admitting: Family Medicine

## 2015-02-11 ENCOUNTER — Encounter (HOSPITAL_COMMUNITY): Payer: Self-pay | Admitting: *Deleted

## 2015-02-11 DIAGNOSIS — J9602 Acute respiratory failure with hypercapnia: Secondary | ICD-10-CM | POA: Insufficient documentation

## 2015-02-11 DIAGNOSIS — Z8589 Personal history of malignant neoplasm of other organs and systems: Secondary | ICD-10-CM | POA: Diagnosis not present

## 2015-02-11 DIAGNOSIS — I10 Essential (primary) hypertension: Secondary | ICD-10-CM | POA: Diagnosis present

## 2015-02-11 DIAGNOSIS — Z923 Personal history of irradiation: Secondary | ICD-10-CM | POA: Diagnosis not present

## 2015-02-11 DIAGNOSIS — Z681 Body mass index (BMI) 19 or less, adult: Secondary | ICD-10-CM | POA: Diagnosis not present

## 2015-02-11 DIAGNOSIS — E43 Unspecified severe protein-calorie malnutrition: Secondary | ICD-10-CM | POA: Diagnosis present

## 2015-02-11 DIAGNOSIS — F329 Major depressive disorder, single episode, unspecified: Secondary | ICD-10-CM | POA: Diagnosis present

## 2015-02-11 DIAGNOSIS — J441 Chronic obstructive pulmonary disease with (acute) exacerbation: Secondary | ICD-10-CM | POA: Diagnosis present

## 2015-02-11 DIAGNOSIS — M545 Low back pain: Secondary | ICD-10-CM | POA: Diagnosis present

## 2015-02-11 DIAGNOSIS — Z7982 Long term (current) use of aspirin: Secondary | ICD-10-CM | POA: Diagnosis not present

## 2015-02-11 DIAGNOSIS — Z8249 Family history of ischemic heart disease and other diseases of the circulatory system: Secondary | ICD-10-CM | POA: Diagnosis not present

## 2015-02-11 DIAGNOSIS — E872 Acidosis: Secondary | ICD-10-CM | POA: Diagnosis present

## 2015-02-11 DIAGNOSIS — L8990 Pressure ulcer of unspecified site, unspecified stage: Secondary | ICD-10-CM | POA: Insufficient documentation

## 2015-02-11 DIAGNOSIS — Z87891 Personal history of nicotine dependence: Secondary | ICD-10-CM | POA: Diagnosis not present

## 2015-02-11 DIAGNOSIS — Z79899 Other long term (current) drug therapy: Secondary | ICD-10-CM | POA: Diagnosis not present

## 2015-02-11 DIAGNOSIS — F064 Anxiety disorder due to known physiological condition: Secondary | ICD-10-CM | POA: Diagnosis present

## 2015-02-11 DIAGNOSIS — R64 Cachexia: Secondary | ICD-10-CM | POA: Diagnosis present

## 2015-02-11 LAB — BLOOD GAS, ARTERIAL
ACID-BASE EXCESS: 9.4 mmol/L — AB (ref 0.0–2.0)
BICARBONATE: 34.2 meq/L — AB (ref 20.0–24.0)
Delivery systems: POSITIVE
Drawn by: 419771
Expiratory PAP: 5
FIO2: 0.4 %
Inspiratory PAP: 15
O2 Saturation: 98.4 %
PCO2 ART: 54 mmHg — AB (ref 35.0–45.0)
PEEP/CPAP: 5 cmH2O
PH ART: 7.417 (ref 7.350–7.450)
Patient temperature: 98.6
RATE: 10 resp/min
TCO2: 35.8 mmol/L (ref 0–100)
pO2, Arterial: 120 mmHg — ABNORMAL HIGH (ref 80.0–100.0)

## 2015-02-11 LAB — BASIC METABOLIC PANEL
ANION GAP: 12 (ref 5–15)
BUN: 6 mg/dL (ref 6–20)
CHLORIDE: 96 mmol/L — AB (ref 101–111)
CO2: 30 mmol/L (ref 22–32)
Calcium: 8.4 mg/dL — ABNORMAL LOW (ref 8.9–10.3)
Creatinine, Ser: 0.58 mg/dL — ABNORMAL LOW (ref 0.61–1.24)
GFR calc Af Amer: >60 mL/min (ref >60)
GFR calc non Af Amer: >60 mL/min (ref >60)
Glucose, Bld: 126 mg/dL — ABNORMAL HIGH (ref 65–99)
Potassium: 3.2 mmol/L — ABNORMAL LOW (ref 3.5–5.1)
SODIUM: 138 mmol/L (ref 135–145)

## 2015-02-11 LAB — CBC
HCT: 34.4 % — ABNORMAL LOW (ref 39.0–52.0)
HEMOGLOBIN: 11.3 g/dL — AB (ref 13.0–17.0)
MCH: 30 pg (ref 26.0–34.0)
MCHC: 32.8 g/dL (ref 30.0–36.0)
MCV: 91.2 fL (ref 78.0–100.0)
Platelets: 142 10*3/uL — ABNORMAL LOW (ref 150–400)
RBC: 3.77 MIL/uL — AB (ref 4.22–5.81)
RDW: 13.5 % (ref 11.5–15.5)
WBC: 8.7 10*3/uL (ref 4.0–10.5)

## 2015-02-11 LAB — MRSA PCR SCREENING: MRSA by PCR: NEGATIVE

## 2015-02-11 MED ORDER — ACETAMINOPHEN 650 MG RE SUPP: 650.0000 mg | Freq: Four times a day (QID) | RECTAL | Status: DC | PRN

## 2015-02-11 MED ORDER — ALPRAZOLAM 0.5 MG PO TABS
0.5000 mg | ORAL_TABLET | Freq: Two times a day (BID) | ORAL | Status: DC
  Administered 2015-02-11 – 2015-02-13 (×5): 0.5 mg via ORAL
  Filled 2015-02-11 (×5): qty 1

## 2015-02-11 MED ORDER — VITAMIN B-1 100 MG PO TABS
100.0000 mg | ORAL_TABLET | Freq: Every day | ORAL | Status: DC
  Administered 2015-02-11 – 2015-02-13 (×3): 100 mg via ORAL
  Filled 2015-02-11 (×3): qty 1

## 2015-02-11 MED ORDER — LEVOFLOXACIN 500 MG PO TABS
500.0000 mg | ORAL_TABLET | ORAL | Status: DC
  Administered 2015-02-11 – 2015-02-12 (×2): 500 mg via ORAL
  Filled 2015-02-11 (×4): qty 1

## 2015-02-11 MED ORDER — SODIUM CHLORIDE 0.9 % IJ SOLN
3.0000 mL | Freq: Two times a day (BID) | INTRAMUSCULAR | Status: DC
  Administered 2015-02-11 – 2015-02-13 (×3): 3 mL via INTRAVENOUS

## 2015-02-11 MED ORDER — BUSPIRONE HCL 10 MG PO TABS
10.0000 mg | ORAL_TABLET | Freq: Two times a day (BID) | ORAL | Status: DC
  Administered 2015-02-11 – 2015-02-13 (×5): 10 mg via ORAL
  Filled 2015-02-11 (×6): qty 1

## 2015-02-11 MED ORDER — IPRATROPIUM-ALBUTEROL 0.5-2.5 (3) MG/3ML IN SOLN
3.0000 mL | RESPIRATORY_TRACT | Status: DC | PRN
  Administered 2015-02-11 (×2): 3 mL via RESPIRATORY_TRACT
  Filled 2015-02-11: qty 3

## 2015-02-11 MED ORDER — IPRATROPIUM-ALBUTEROL 0.5-2.5 (3) MG/3ML IN SOLN: 3.0000 mL | RESPIRATORY_TRACT | Status: DC

## 2015-02-11 MED ORDER — NICOTINE 7 MG/24HR TD PT24
7.0000 mg | MEDICATED_PATCH | TRANSDERMAL | Status: DC
Start: 2015-02-12 — End: 2015-02-13
  Administered 2015-02-12 – 2015-02-13 (×2): 7 mg via TRANSDERMAL
  Filled 2015-02-11 (×3): qty 1

## 2015-02-11 MED ORDER — BUDESONIDE 0.5 MG/2ML IN SUSP
0.5000 mg | Freq: Two times a day (BID) | RESPIRATORY_TRACT | Status: DC
  Filled 2015-02-11: qty 2

## 2015-02-11 MED ORDER — ASPIRIN 81 MG PO CHEW
81.0000 mg | CHEWABLE_TABLET | Freq: Every day | ORAL | Status: DC
  Administered 2015-02-11 – 2015-02-13 (×3): 81 mg via ORAL
  Filled 2015-02-11 (×3): qty 1

## 2015-02-11 MED ORDER — ENOXAPARIN SODIUM 30 MG/0.3ML ~~LOC~~ SOLN
20.0000 mg | SUBCUTANEOUS | Status: DC
  Administered 2015-02-11 – 2015-02-12 (×2): 20 mg via SUBCUTANEOUS
  Filled 2015-02-11 (×2): qty 0.2
  Filled 2015-02-11: qty 0.3
  Filled 2015-02-11: qty 0.2

## 2015-02-11 MED ORDER — PREDNISONE 50 MG PO TABS
50.0000 mg | ORAL_TABLET | Freq: Every day | ORAL | Status: DC
  Administered 2015-02-11 – 2015-02-12 (×2): 50 mg via ORAL
  Filled 2015-02-11 (×3): qty 1

## 2015-02-11 MED ORDER — POTASSIUM CHLORIDE CRYS ER 20 MEQ PO TBCR
60.0000 meq | EXTENDED_RELEASE_TABLET | Freq: Once | ORAL | Status: AC
  Administered 2015-02-11: 60 meq via ORAL
  Filled 2015-02-11: qty 3

## 2015-02-11 MED ORDER — GUAIFENESIN ER 600 MG PO TB12
600.0000 mg | ORAL_TABLET | Freq: Two times a day (BID) | ORAL | Status: DC | PRN
  Filled 2015-02-11: qty 1

## 2015-02-11 MED ORDER — ACETAMINOPHEN 325 MG PO TABS
650.0000 mg | ORAL_TABLET | Freq: Four times a day (QID) | ORAL | Status: DC | PRN
  Administered 2015-02-13: 650 mg via ORAL
  Filled 2015-02-11: qty 2

## 2015-02-11 MED ORDER — IRBESARTAN 150 MG PO TABS
150.0000 mg | ORAL_TABLET | Freq: Every day | ORAL | Status: DC
  Administered 2015-02-11: 150 mg via ORAL
  Filled 2015-02-11: qty 1

## 2015-02-11 MED ORDER — ADULT MULTIVITAMIN W/MINERALS CH
1.0000 | ORAL_TABLET | Freq: Every day | ORAL | Status: DC
Start: 2015-02-11 — End: 2015-02-13
  Administered 2015-02-11 – 2015-02-13 (×3): 1 via ORAL
  Filled 2015-02-11 (×3): qty 1

## 2015-02-11 MED ORDER — ENSURE ENLIVE PO LIQD
237.0000 mL | Freq: Three times a day (TID) | ORAL | Status: DC
  Administered 2015-02-11 – 2015-02-13 (×6): 237 mL via ORAL

## 2015-02-11 MED ORDER — BUDESONIDE 0.5 MG/2ML IN SUSP
0.5000 mg | Freq: Two times a day (BID) | RESPIRATORY_TRACT | Status: DC
  Administered 2015-02-11 – 2015-02-13 (×5): 0.5 mg via RESPIRATORY_TRACT
  Filled 2015-02-11 (×11): qty 2

## 2015-02-11 MED ORDER — SODIUM CHLORIDE 0.9 % IJ SOLN
3.0000 mL | INTRAMUSCULAR | Status: DC | PRN
  Administered 2015-02-11 – 2015-02-12 (×2): 3 mL via INTRAVENOUS
  Filled 2015-02-11 (×2): qty 3

## 2015-02-11 MED ORDER — LORAZEPAM 2 MG/ML IJ SOLN: 1.0000 mg | Freq: Four times a day (QID) | INTRAMUSCULAR | Status: DC | PRN

## 2015-02-11 MED ORDER — LORAZEPAM 1 MG PO TABS
1.0000 mg | ORAL_TABLET | Freq: Four times a day (QID) | ORAL | Status: DC | PRN
  Administered 2015-02-11 – 2015-02-12 (×2): 1 mg via ORAL
  Filled 2015-02-11: qty 2
  Filled 2015-02-11: qty 1

## 2015-02-11 MED ORDER — THIAMINE HCL 100 MG/ML IJ SOLN
100.0000 mg | Freq: Every day | INTRAMUSCULAR | Status: DC
  Filled 2015-02-11 (×2): qty 1

## 2015-02-11 MED ORDER — FOLIC ACID 1 MG PO TABS
1.0000 mg | ORAL_TABLET | Freq: Every day | ORAL | Status: DC
  Administered 2015-02-11 – 2015-02-13 (×3): 1 mg via ORAL
  Filled 2015-02-11 (×3): qty 1

## 2015-02-11 MED ORDER — SODIUM CHLORIDE 0.9 % IV SOLN: 250.0000 mL | INTRAVENOUS | Status: DC | PRN

## 2015-02-11 MED ORDER — HYDROCHLOROTHIAZIDE 25 MG PO TABS
25.0000 mg | ORAL_TABLET | Freq: Every day | ORAL | Status: DC
  Administered 2015-02-11: 25 mg via ORAL
  Filled 2015-02-11: qty 1

## 2015-02-11 MED ORDER — FUROSEMIDE 10 MG/ML IJ SOLN
40.0000 mg | Freq: Once | INTRAMUSCULAR | Status: DC
  Filled 2015-02-11: qty 4

## 2015-02-11 MED ORDER — IPRATROPIUM-ALBUTEROL 0.5-2.5 (3) MG/3ML IN SOLN: 3.0000 mL | RESPIRATORY_TRACT | Status: DC | PRN

## 2015-02-11 MED ORDER — IPRATROPIUM-ALBUTEROL 0.5-2.5 (3) MG/3ML IN SOLN
3.0000 mL | RESPIRATORY_TRACT | Status: AC
  Administered 2015-02-11 (×4): 3 mL via RESPIRATORY_TRACT
  Filled 2015-02-11 (×5): qty 3

## 2015-02-11 MED ORDER — SERTRALINE HCL 50 MG PO TABS
50.0000 mg | ORAL_TABLET | Freq: Every day | ORAL | Status: DC
  Administered 2015-02-11 – 2015-02-13 (×3): 50 mg via ORAL
  Filled 2015-02-11 (×3): qty 1

## 2015-02-11 MED ORDER — NICOTINE 7 MG/24HR TD PT24: 7.0000 mg | MEDICATED_PATCH | Freq: Every day | TRANSDERMAL | Status: DC

## 2015-02-11 NOTE — Telephone Encounter (Signed)
lmtcb for pt and Cindy.

## 2015-02-11 NOTE — Progress Notes (Signed)
FPTS Interim Progress Note  S: Hector House is a 69 yo M with PMH of COPD, HTN, and anxiety presenting with shortness of breath.   [name] reports that he is breathing more easily today. He thinks the breathing treatments have helped. He is not off BiPap and is using a Venturi mask instead. He is very concerned that he does not receive enough Xanax at home, and is worried we will discharge him before he can get his Xanax prescription refilled.   O: BP 105/64 mmHg  Pulse 81  Temp(Src) 98.8 F (37.1 C) (Axillary)  Resp 20  Ht 5\' 7"  (1.702 m)  Wt 95 lb 7.4 oz (43.3 kg)  BMI 14.95 kg/m2  SpO2 97%   Exam: General: Cachectic male sitting in chair  Cardiovascular: RRR, no murmurs appreciated Respiratory: wearing Venturi mask; wheezes in all lung fields  Abdomen: tenderness to deep palpation of lower quadrants, non-distended,soft, +BS MSK: decreased strength in bilateral upper and lower extremities Skin: no cyanosis or rash Psych: normal mood and affect  A/P:  COPD exacerbation: patient still smoking cigarettes per pulmonology but denies during this admission**. Has been on formoterol nebs BID, DuoNeb four times a day, Pulmicort BID, and albuterol PRN. On 3L O2 [place] at home. Currently on BiPap. ABG consistent with respiratory acidosis with compensation with a pH of 7.3. CXR revealed hyperinflation and results consistent with emphysematous changes but no signs of infection. No evidence of fluid overload, but patient does have BNP of 141. O2 sats 97-100% on BiPap last night, no tachypnea since 0100.  -Lasix 40 given to see if any improvement in WOB -Monitor ABG results -Continue monitoring O2 -Continue monitoring respiratory rate -Levaquin 500 mg PO qd -Continue Pulmicort neb BID, Duoneb q4, prednisone 50 mg PO qd -Transfer to floor from stepdown  Hypertension: on ARB-HCTZ at home per pulmonologist  -D/c irbesartan and HCTZ  Anxiety:  -Continue home Xanax -Monitor for sx of  anxiety -Zoloft 50 mg qd -Monitor for oversedation  Protein-calorie malnutrition, severe: RD was unable to obtain a nutrition history because [name] was wearing the venturi mask -Ensure Enlive oral nutrition supplements per RD  FEN/GI: NPO, IV saline locked Prophylaxis: Lovenox   Verner Mould, MD  02/11/2015, 6:29 AM PGY-1, [place] [facility] Service pager [phone]

## 2015-02-11 NOTE — Telephone Encounter (Signed)
Called to speak with patient sister, [name], unable to talk and asked that I speak with her friend Dylene Relayed message through Dylene to [place] about changes in patients medications.  Dylene states that the patient is currently in the hospital at [facility]- they were made aware this morning - pt was seen in ED 02/10/15.  Would like to make Dr Melvyn Novas aware of the admission from ED.   Please advise Dr Melvyn Novas. Thanks.

## 2015-02-11 NOTE — Telephone Encounter (Signed)
Will forward to MD to make him aware. Daymien Goth,CMA  

## 2015-02-11 NOTE — Progress Notes (Signed)
Pt is transferrred from 2c to 5W18. Admission vital sign is stable.

## 2015-02-11 NOTE — Progress Notes (Signed)
Utilization review completed. Darina Hartwell, RN, BSN. 

## 2015-02-11 NOTE — Telephone Encounter (Signed)
Cindy cb, please cb at previous number listed

## 2015-02-11 NOTE — Progress Notes (Signed)
Initial Nutrition Assessment  DOCUMENTATION CODES:  Severe malnutrition in context of chronic illness, Underweight  INTERVENTION:  Ensure Enlive (each supplement provides 350kcal and 20 grams of protein)  NUTRITION DIAGNOSIS:  Increased nutrient needs related to chronic illness as evidenced by estimated needs  GOAL:  Patient will meet greater than or equal to 90% of their needs  MONITOR:  PO intake, Supplement acceptance, Labs, Weight trends, I & O's  REASON FOR ASSESSMENT:  Consult Assessment of nutrition requirement/status  ASSESSMENT: 69 y.o. Male with PMH significant for COPD, HTN, anxiety, low back pain; presented with shortness of breath for 3 days.  RD unable to obtain nutrition hx at this time.  Venturi mask on.  Pt seen per Clinical Nutrition during previous hospitalization in May 2016.  Pt with limited access to food.  + hx of weight loss.  Per readings below, pt's weight has been stable since May.  Malnutrition identified and ongoing.  RD to order Ensure Enlive oral nutrition supplements.  RD unable to complete Nutrition Focused Physical Exam at this time.  Height:  Ht Readings from Last 1 Encounters:  02/11/15 5\' 7"  (1.702 m)    Weight:  Wt Readings from Last 1 Encounters:  02/11/15 95 lb 7.4 oz (43.3 kg)    Ideal Body Weight:  67.2 kg  Wt Readings from Last 10 Encounters:  02/11/15 95 lb 7.4 oz (43.3 kg)  02/04/15 93 lb (42.185 kg)  01/17/15 97 lb 7.1 oz (44.2 kg)  12/16/14 96 lb 4.8 oz (43.681 kg)  12/11/14 92 lb 11.2 oz (42.048 kg)  10/11/14 110 lb 14.4 oz (50.304 kg)  09/06/14 107 lb 6.4 oz (48.716 kg)  08/01/14 101 lb (45.813 kg)  07/17/14 101 lb (45.813 kg)  07/12/14 107 lb (48.535 kg)    BMI:  Body mass index is 14.95 kg/(m^2).  Estimated Nutritional Needs:  Kcal:  1500-1700  Protein:  70-80 gm  Fluid:  1.5-1.7 L  Skin:  Wound (see comment) (Stage I to sacrum)  Diet Order:  Diet regular Room service appropriate?: Yes; Fluid  consistency:: Thin  EDUCATION NEEDS:  No education needs identified at this time   Intake/Output Summary (Last 24 hours) at 02/11/15 1211 Last data filed at 02/11/15 0130  Gross per 24 hour  Intake      0 ml  Output    200 ml  Net   -200 ml    Last BM:  Unknown  Arthur Holms, RD, LDN Pager #: [phone] After-Hours Pager #: [phone]

## 2015-02-11 NOTE — Telephone Encounter (Signed)
Calling because patient was admitted into hospital last night, and would like for PCP to be aware of this as this is the reason his appt was missed this morning. Hector House, ASA

## 2015-02-11 NOTE — Progress Notes (Signed)
Utilization review completed. Opie Maclaughlin, RN, BSN. 

## 2015-02-11 NOTE — Care Management Note (Addendum)
Case Management Note  Patient Details  Name: Hector House MRN: [id number] Date of Birth: 03/02/1946  Subjective/Objective:  Adm w copd exacerb                Action/Plan: lives alone, pcp dr Lonny Prude, act w [facility] home[facility], has rw    Expected Discharge Date:                  Expected Discharge Plan:  [place]  In-House Referral:     Discharge planning Services  CM Consult  Post Acute Care Choice:  Resumption of Svcs/PTA Provider Choice offered to:     DME Arranged:    DME Agency:     HH Arranged:  Disease Management, RN, PT [place] Agency:    Status of Service:     Medicare Important Message Given:    Date Medicare IM Given:    Medicare IM give by:    Date Additional Medicare IM Given:    Additional Medicare Important Message give by:     If discussed at [place] of Stay Meetings, dates discussed:    Additional Comments: [facility]. Have alerted ms harrison w [facility] of pt's adm.  Lacretia Leigh, RN 02/11/2015, 10:45 AM

## 2015-02-11 NOTE — Evaluation (Signed)
Physical Therapy Evaluation Patient Details Name: Hector House MRN: [id number] DOB: September 13, 1945 Today's Date: 02/11/2015   History of Present Illness  Hector House is a 69 y.o. male presenting with shortness of breath for the past 3 days. PMH is significant for COPD, HTN, Anxiety, low back pain.   Clinical Impression  Pt admitted with above diagnosis. Pt currently with functional limitations due to the deficits listed below (see PT Problem List). Pt should be able to go home with use of RW and HHPT.  May need HHRN as well.  Will follow acutely.   Pt will benefit from skilled PT to increase their independence and safety with mobility to allow discharge to the venue listed below.      Follow Up Recommendations Home health PT;Supervision/Assistance - 24 hour, HHRN    Equipment Recommendations  None recommended by PT    Recommendations for Other Services       Precautions / Restrictions Precautions Precautions: Fall Restrictions Weight Bearing Restrictions: No      Mobility  Bed Mobility Overal bed mobility: Needs Assistance Bed Mobility: Supine to Sit     Supine to sit: Min guard     General bed mobility comments: Needed guard assist due to pt shaky with movement.  Needed incr time and used bed rail.  Transfers Overall transfer level: Needs assistance Equipment used: 2 person hand held assist Transfers: Sit to/from [facility] Sit to Stand: Min assist;Mod assist Stand pivot transfers: Min assist;Mod assist       General transfer comment: Pt unsteady once on feet.  Pt needed steadying assist throughout transfer.  Pt states it is because he hasnt eaten.  Told pt he may need to use a RW at home for stability.   Ambulation/Gait                Stairs            Wheelchair Mobility    Modified Rankin (Stroke Patients Only)       Balance Overall balance assessment: Needs assistance Sitting-balance support: No upper extremity supported;Feet  supported Sitting balance-Leahy Scale: Fair     Standing balance support: Bilateral upper extremity supported;During functional activity Standing balance-Leahy Scale: Poor Standing balance comment: needed bil Ue support to maintain stability as he was unsteady on his feet.                              Pertinent Vitals/Pain Pain Assessment: No/denies pain  108/53, 88 bpm, 99% on 45% face mask.    Home Living Family/patient expects to be discharged to:: Private residence Living Arrangements: Alone Available Help at Discharge: Friend(s);Available PRN/intermittently (Pt pays a neighbor to cook, clean and shop M-Sat) Type of Home: Apartment Home Access: Level entry     Home Layout: One level Home Equipment: Walker - 2 wheels;Cane - single point;Walker - 4 wheels;Shower seat;Wheelchair - manual Additional Comments: States he had equipment but did not use any of it but shower chair.    Prior Function Level of Independence: Independent               Hand Dominance   Dominant Hand: Right    Extremity/Trunk Assessment   Upper Extremity Assessment: Defer to OT evaluation           Lower Extremity Assessment: Generalized weakness      Cervical / Trunk Assessment: Normal  Communication   Communication: No difficulties  Cognition  Arousal/Alertness: Awake/alert Behavior During Therapy: Anxious Overall Cognitive Status: Within Functional Limits for tasks assessed                      General Comments      Exercises        Assessment/Plan    PT Assessment Patient needs continued PT services  PT Diagnosis Generalized weakness   PT Problem List Decreased activity tolerance;Decreased balance;Decreased mobility;Decreased knowledge of use of DME;Decreased safety awareness;Decreased knowledge of precautions  PT Treatment Interventions DME instruction;Gait training;Functional mobility training;Therapeutic activities;Therapeutic exercise;Balance  training;Patient/family education   PT Goals (Current goals can be found in the Care Plan section) Acute Rehab PT Goals Patient Stated Goal: to go home PT Goal Formulation: With patient Time For Goal Achievement: 02/25/15 Potential to Achieve Goals: Good    Frequency Min 3X/week   Barriers to discharge Decreased caregiver support      Co-evaluation               End of Session Equipment Utilized During Treatment: Gait belt;Oxygen Activity Tolerance: Patient limited by fatigue Patient left: in chair;with call bell/phone within reach Nurse Communication: Mobility status         Time: 1027-2536 PT Time Calculation (min) (ACUTE ONLY): 17 min   Charges:   PT Evaluation $Initial PT Evaluation Tier I: 1 Procedure     PT G CodesDenice House 2015-02-24, 9:24 AM Hector House Acute Rehabilitation [phone] (pager)

## 2015-02-11 NOTE — H&P (Signed)
[facility] Admission History and Physical Service Pager: [phone]  Patient name: Hector House Medical record number: [id number] Date of birth: 1946-03-21 Age: 69 y.o. Gender: male  Primary Care Provider: [name], MD Consultants: none  Code Status: full code   Chief Complaint: shortness of breath  Assessment and Plan: STERLIN KNIGHTLY is a 69 y.o. male presenting with shortness of breath for the past 3 days. PMH is significant for COPD, HTN, Anxiety, low back pain.   Acute respiratory failure: Most consistent with a COPD Exacerbation- Patient recently saw his pulmonogist (Dr. Lake Bells) and admitted to smoking cigarettes with an urge to stop. At today's visit patient denies recent cigarette smoking. Patient has been taking long-acting beta agonist in the form of formoterol twice a day nebulized, DuoNeb 4 times a day, Pulmicort twice a day, and albuterol as needed. Patient has been on 3 L O2 [place] at home. Pt currently on BiPap. ABG consistent respiratory acidosis with compensation with a pH of 7.3.  CXR revealed hyperinflation and results consistent with emphysematous changes. No signs of infection noted on CXR. No evidence of fluid overload, however pt does has a slightly elevated BNP to 141, clinically may be slightly volume overloaded. Echo in 2015 with an EF of 50-55%.  -Continue BiPap for now wean as tolerated.  -repeat ABG in the AM -Continue monitoring O2, currently 96% -Continue monitoring respiratory rate, currently 20 - will give Lasix 40mg  IV to see if there's any improvement in WOB - the patient received solumedrol by EMS, will start prednisone 50mg  daily x 5 days - s/p azithromycin in the ED, no evidence of PNA on imaging, will switch to levaquin  - duonebs q 2hrs, spaced to q4hrs now - continue home regimen fore now   Hypertension- Was switched from ACE to ARB-HCTZ due to dry cough   -Monitor blood pressure -Continue current medication  Anxiety-  Anxiousness improved with Xanax.  -Monitor symptoms of anxiety - continue Buspar and Zoloft  - continue to monitor for oversedation (  Protein-calorie malnutrition, severe -Consider nutrition consult  FEN/GI: NPO, IV saline locked Prophylaxis: Lovenox  Disposition: admit to stepdown unit, attending Dr. Erin Hearing  History of Present Illness: Hector House is a 69 y.o. male presenting with shortness of breath for the last 3 days. He has a PMH of COPD, HTN, Anxiety, and back pain. Patient noticed that his shortness of breath was worse on the evening of 7/04 so he called 911. EMS arrived and gave him a dose of 2mg  IV Mag, Solumedrol, and 2 Duonebs. He was placed on BiPap en route to [facility] ED. Patient's shortness of breath did not improve upon arrival to the ED. In ED patient became extremely anxious with a CO2 of 70 and was given a dose of Ativan. He was also given a dose of Azithromycin.   Patient is still struggling with shortness of breath/trouble breathing and admits to cough with yellow sputum. He denies fevers/chills, chest pain, abdominal pain, nausea, vomiting, diarrhea, or constipation. No sick contacts. Of note, it is difficult to get a great HPI from the patient given somnolence and BiPAP.   Patient has been taking long-acting beta agonist in the form of formoterol twice a day nebulized, DuoNeb 4 times a day, Pulmicort twice a day, and albuterol as needed. Patient has been on 3 L O2 [place] at home.   Review Of Systems: Per HPI with the following additions: positive anxiety Otherwise 12 point review of systems  was performed and was unremarkable.  Patient Active Problem List   Diagnosis Date Noted  . Underweight 06/24/2014  . Protein-calorie malnutrition, severe 11/02/2013  . Anxiety disorder due to medical condition 11/01/2013  . 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 History of sarcoma of soft tissue 09/20/2011  . 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 Alcohol use 10/22/2009  .  HYPERTENSION, BENIGN ESSENTIAL 09/25/2009  . COPD (chronic obstructive pulmonary disease) with emphysema 09/25/2009  . LOW BACK PAIN SYNDROME 09/25/2009   Past Medical History: Past Medical History  Diagnosis Date  . Hypertension   . Low back pain syndrome     Joint Pain  . Emphysema   . S/P radiation therapy 07/23/10 - 09/14/10    Right Thigh for Leiomyosarcoma / 61.2 Gy / 34 Fractions  . Respiratory failure, acute 3/276/15 -11/04/13     Hospitalized  . Depression   . COPD (chronic obstructive pulmonary disease)   . 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 Pneumonia 1979  . Headache     "1-2/wk" (01/16/2015)  . Arthritis     "hands" (01/16/2015)  . Chronic back pain   . Anxiety    Past Surgical History: Past Surgical History  Procedure Laterality Date  . Elbow fracture surgery Left ~ 1964  . Leg skin lesion  biopsy / excision Right 04/20/10; 06/18/10    leiomyosarcoma; inner thigh   . Back surgery  X 4  . Elbow surgery Left   . Left heart catheterization with coronary angiogram N/A 11/02/2013    Procedure: LEFT HEART CATHETERIZATION WITH CORONARY ANGIOGRAM;  Surgeon: Troy Sine, MD;  Location: [facility] CATH LAB;  Service: Cardiovascular;  Laterality: N/A;  . Cardiac catheterization    . Lymph node dissection Right 06/2010    groin/notes 06/19/2010  . Repair dural / csf leak  01/06/2007; 01/19/2007    lumbar/notes 12/09/2010  . Lumbar laminectomy/decompression microdiscectomy  ~ 11/2006; 01/19/2007    Archie Endo 12/09/2010  . Colonoscopy w/ biopsies and polypectomy     Social History: History  Substance Use Topics  . Smoking status: Former Smoker -- 2.00 packs/day for 51 years    Types: Cigarettes    Quit date: 08/09/2014  . Smokeless tobacco: Never Used  . Alcohol Use: Yes     Comment: 01/16/2015 "stopped drinking in Jan"    Additional social history: Patient denies smoking at this time but admits to smoking in the past Please also refer to relevant sections of EMR.  Family  History: Family History  Problem Relation Age of Onset  . Heart disease Mother     heart attack   Allergies and Medications: No Known Allergies No current facility-administered medications on file prior to encounter.   Current Outpatient Prescriptions on File Prior to Encounter  Medication Sig Dispense Refill  . albuterol (PROAIR HFA) 108 (90 BASE) MCG/ACT inhaler INHALE 2 PUFFS BY MOUTH EVERY 4 TO 6 HOURS AS NEEDED 18 g 2  . ALPRAZolam (XANAX) 0.5 MG tablet Take 1 tablet (0.5 mg total) by mouth 2 (two) times daily. 60 tablet 0  . aspirin 81 MG tablet Take 1 tablet (81 mg total) by mouth daily. 30 tablet 5  . budesonide (PULMICORT) 0.5 MG/2ML nebulizer solution Take 2 mLs (0.5 mg total) by nebulization 2 (two) times daily. 2 mL 5  . busPIRone (BUSPAR) 10 MG tablet Take 1 tablet (10 mg total) by mouth 2 (two) times daily. 60 tablet 5  . feeding supplement (BOOST HIGH PROTEIN)  LIQD Take 237 mLs by mouth 2 (two) times daily between meals. 60 Can 11  . formoterol (PERFOROMIST) 20 MCG/2ML nebulizer solution Take 2 mLs (20 mcg total) by nebulization 2 (two) times daily. 120 mL 5  . guaiFENesin (MUCINEX) 600 MG 12 hr tablet Take 1 tablet (600 mg total) by mouth 2 (two) times daily as needed. (Patient taking differently: Take 600 mg by mouth 2 (two) times daily as needed for cough or to loosen phlegm. ) 20 tablet 2  . ipratropium-albuterol (DUONEB) 0.5-2.5 (3) MG/3ML SOLN Take 3 mLs by nebulization 4 (four) times daily. Can use an additional 2 treatments if needed. 360 mL 11  . lactose free nutrition (BOOST) LIQD Take 237 mLs by mouth daily.    . sertraline (ZOLOFT) 50 MG tablet TAKE 1 TABLET BY MOUTH DAILY 90 tablet 2  . valsartan-hydrochlorothiazide (DIOVAN HCT) 160-25 MG per tablet Take 1 tablet by mouth daily. 30 tablet 11    Objective: BP 117/75 mmHg  Pulse 102  Resp 20  Ht 5\' 7"  (1.702 m)  Wt 93 lb (42.185 kg)  BMI 14.56 kg/m2  SpO2 96% Exam: General: In mild to moderate respiratory  distress, chachectic appearing Eyes: miotic bilateral pupils, slightly reactive to light ENTM: Bipap in place Cardiovascular: RRR, no murmurs, no rubs or gallops.  no edema Respiratory: labored breathing, decreased air movement with occasional expiratory wheezing bilaterally however difficult to auscultate due to Bipap No crackles or rhonchi noted. Satting 95% at that time.  Abdomen: Non tender, soft, normal bowel sounds MSK: Thin/frail with muscular atrophy  Skin: no cyanosis or rash Neuro: intermittently awakes to questioning and sometimes answers appropriately. Somnolent. EOMI,. Able to move upper and lower extremities on request however unable to participate in meaningful strength exam.  Psych: unable to accurately assess due to respiratory distress  Labs and Imaging: CBC BMET   Recent Labs Lab 02/10/15 2142  WBC 8.6  HGB 12.1*  HCT 36.6*  PLT 174    Recent Labs Lab 02/10/15 2142  NA 136  K 3.2*  CL 92*  CO2 35*  BUN 7  CREATININE 0.70  GLUCOSE 126*  CALCIUM 8.5*     ABG    Component Value Date/Time   PHART 7.339* 02/10/2015 2205   PCO2ART 74.7* 02/10/2015 2205   PO2ART 171.0* 02/10/2015 2205   HCO3 40.2* 02/10/2015 2205   TCO2 42 02/10/2015 2205   O2SAT 99.0 02/10/2015 2205   Dg Chest Port 1 View  02/10/2015   CLINICAL DATA:  Shortness of breath.  History of emphysema  EXAM: PORTABLE CHEST - 1 VIEW  COMPARISON:  01/16/2015  FINDINGS: Hyperinflation and emphysematous change. There is no edema, consolidation, effusion, or pneumothorax. Normal heart size and mediastinal contours.  IMPRESSION: Emphysema without acute superimposed disease.   Electronically Signed   By: Monte Fantasia M.D.   On: 02/10/2015 22:03  iSTAT trop: 0.0 BNP elevated to 141.6  EKG: NSR, HR 94. Low voltage in precordial leads. No ST elevation or depression. QTc Hindman, MD 02/11/2015, 1:02 AM PGY-1, [facility] Intern pager: [phone], text pages  welcome  Upper Level Addendum:  I have seen and evaluated this patient along with Dr. Juanito Doom and reviewed the above note, making necessary revisions in purple.   Archie Patten, MD [place] Resident, PGY-2

## 2015-02-12 ENCOUNTER — Ambulatory Visit: Payer: Medicare Other | Admitting: Family Medicine

## 2015-02-12 DIAGNOSIS — J441 Chronic obstructive pulmonary disease with (acute) exacerbation: Principal | ICD-10-CM

## 2015-02-12 DIAGNOSIS — J9602 Acute respiratory failure with hypercapnia: Secondary | ICD-10-CM

## 2015-02-12 LAB — BASIC METABOLIC PANEL
ANION GAP: 5 (ref 5–15)
BUN: 9 mg/dL (ref 6–20)
CALCIUM: 8.6 mg/dL — AB (ref 8.9–10.3)
CO2: 40 mmol/L — AB (ref 22–32)
CREATININE: 0.65 mg/dL (ref 0.61–1.24)
Chloride: 92 mmol/L — ABNORMAL LOW (ref 101–111)
GFR calc Af Amer: >60 mL/min (ref >60)
Glucose, Bld: 91 mg/dL (ref 65–99)
Potassium: 3.3 mmol/L — ABNORMAL LOW (ref 3.5–5.1)
Sodium: 137 mmol/L (ref 135–145)

## 2015-02-12 LAB — GLUCOSE, CAPILLARY: Glucose-Capillary: 130 mg/dL — ABNORMAL HIGH (ref 65–99)

## 2015-02-12 MED ORDER — IPRATROPIUM-ALBUTEROL 0.5-2.5 (3) MG/3ML IN SOLN
3.0000 mL | RESPIRATORY_TRACT | Status: DC
  Administered 2015-02-12 (×3): 3 mL via RESPIRATORY_TRACT
  Filled 2015-02-12 (×3): qty 3

## 2015-02-12 MED ORDER — IPRATROPIUM-ALBUTEROL 0.5-2.5 (3) MG/3ML IN SOLN: 3.0000 mL | RESPIRATORY_TRACT | Status: DC

## 2015-02-12 MED ORDER — IPRATROPIUM-ALBUTEROL 0.5-2.5 (3) MG/3ML IN SOLN
3.0000 mL | Freq: Four times a day (QID) | RESPIRATORY_TRACT | Status: DC
  Administered 2015-02-13 (×3): 3 mL via RESPIRATORY_TRACT
  Filled 2015-02-12 (×3): qty 3

## 2015-02-12 MED ORDER — ALBUTEROL SULFATE (2.5 MG/3ML) 0.083% IN NEBU: 2.5000 mg | INHALATION_SOLUTION | RESPIRATORY_TRACT | Status: DC | PRN

## 2015-02-12 MED ORDER — PREDNISONE 50 MG PO TABS
50.0000 mg | ORAL_TABLET | Freq: Every day | ORAL | Status: DC
  Administered 2015-02-13: 50 mg via ORAL
  Filled 2015-02-12: qty 1

## 2015-02-12 NOTE — Progress Notes (Signed)
Family Medicine Teaching Service Daily Progress Note Intern Pager: [phone]  Patient name: Hector House Medical record number: [id number] Date of birth: 1945-12-26 Age: 69 y.o. Gender: male  Primary Care Provider: [name], MD Consultants: None Code Status: FULL  Pt Overview and Major Events to Date:  07/04 - admitted for SOB 07/05 - transitioned from BiPap to Venturi to nasal cannula   Assessment and Plan: COPD exacerbation: patient still smoking cigarettes per pulmonology but denies during this admission. Has been on formoterol nebs BID, DuoNeb four times a day, Pulmicort BID, and albuterol PRN. On 3L O2 [place] at home. ABG on arrival consistent with respiratory acidosis with compensation with a pH of 7.3. CXR revealed hyperinflation and results consistent with emphysematous changes but no signs of infection. No evidence of fluid overload, but patient does have BNP of 141. Now on [place] with sats between 92-100%.  -Monitor ABG results -Continue monitoring O2 -Continue monitoring respiratory rate -Levaquin 500 mg PO qd -Continue Pulmicort neb BID, prednisone 50 mg PO qd -Duoneb q4 scheduled with Duoneb q2 PRN  Hypertension: on ARB-HCTZ at home per pulmonologist  -D/c irbesartan and HCTZ  Anxiety:  -Continue home Xanax -Monitor for sx of anxiety -Zoloft 50 mg qd -Monitor for oversedation  Protein-calorie malnutrition, severe: RD was unable to obtain a nutrition history because Hector House was wearing the venturi mask -Ensure Enlive oral nutrition supplements per RD  FEN/GI: NPO, IV saline locked Prophylaxis: Lovenox  Disposition: home  Subjective:   [name] had very increased WOB and wheezing when I entered his room this morning. He said he did not feel well because he was having such a hard time breathing, and asked for an albuterol inhaler. Of note, he has not been receiving Duonebs scheduled q4, and last received a Duoneb over 12 hours ago. He reported continued lower  abdominal pain as well.   Objective: Temp:  [98 F (36.7 C)-98.8 F (37.1 C)] 98.7 F (37.1 C) (07/06 0539) Pulse Rate:  [72-97] 72 (07/06 0652) Resp:  [14-24] 20 (07/06 0539) BP: (91-112)/(51-66) 112/66 mmHg (07/06 0652) SpO2:  [92 %-100 %] 92 % (07/06 0730) FiO2 (%):  [30 %] 30 % (07/05 1300) Physical Exam: General: Cachectic male lying in bed with visible increased WOB Cardiovascular: RRR, no murmurs appreciated Respiratory: wearing [place]; wheezes in all lung fields  Abdomen: tenderness to deep palpation of lower quadrants, non-distended,soft, +BS MSK: decreased strength in bilateral upper and lower extremities Skin: no cyanosis or rash Psych: normal mood and affect  Laboratory:  Recent Labs Lab 02/10/15 2142 02/11/15 0230  WBC 8.6 8.7  HGB 12.1* 11.3*  HCT 36.6* 34.4*  PLT 174 142*    Recent Labs Lab 02/10/15 2142 02/11/15 0230 02/12/15 0558  NA 136 138 137  K 3.2* 3.2* 3.3*  CL 92* 96* 92*  CO2 35* 30 40*  BUN 7 6 9   CREATININE 0.70 0.58* 0.65  CALCIUM 8.5* 8.4* 8.6*  GLUCOSE 126* 126* 91    Imaging/Diagnostic Tests: Dg Chest Port 1 View  02/10/2015   CLINICAL DATA:  Shortness of breath.  History of emphysema  EXAM: PORTABLE CHEST - 1 VIEW  COMPARISON:  01/16/2015  FINDINGS: Hyperinflation and emphysematous change. There is no edema, consolidation, effusion, or pneumothorax. Normal heart size and mediastinal contours.  IMPRESSION: Emphysema without acute superimposed disease.   Electronically Signed   By: Monte Fantasia M.D.   On: 02/10/2015 22:03    Verner Mould, MD 02/12/2015, 9:37 AM PGY-1, [facility] Intern pager: [phone], text pages welcome

## 2015-02-12 NOTE — Progress Notes (Signed)
Physical Therapy Treatment Patient Details Name: Hector House MRN: [id number] DOB: 05-29-1946 Today's Date: 02/12/2015    History of Present Illness Hector House is a 69 y.o. male presenting with shortness of breath for the past 3 days. PMH is significant for COPD, HTN, Anxiety, low back pain.     PT Comments    Pt continues to be limited due to SOB and labored breathing. Pt on 2.5L O2 desat to 90% with activity. Unsteady/poor balance with stand pivot transfers. Recommend use of rolling walker vs rollator pending progress with balance and energy conservation.   Follow Up Recommendations  Home health PT;Supervision/Assistance - 24 hour     Equipment Recommendations  Rolling walker vs rollator    Recommendations for Other Services       Precautions / Restrictions Precautions Precautions: Fall Precaution Comments: monitor O2 sats  Restrictions Weight Bearing Restrictions: No    Mobility  Bed Mobility Overal bed mobility: Modified Independent Bed Mobility: Supine to Sit     Supine to sit: HOB elevated;Modified independent (Device/Increase time)     General bed mobility comments: use of handrails  Transfers Overall transfer level: Needs assistance Equipment used: 1 person hand held assist Transfers: Sit to/from [facility] Sit to Stand: Min guard Stand pivot transfers: Min assist       General transfer comment: Pt required handheld (A) to balance/steady himself. Demo labored breathing and SOB; O2 sats 91% on 2.5L O2 ; may benefit from use of rolling walker vs rollator for incr ambulation and energy conservation  Ambulation/Gait             General Gait Details: pivotal steps to chair only due to SOB and labored breathing    Stairs            Wheelchair Mobility    Modified Rankin (Stroke Patients Only)       Balance Overall balance assessment: Needs assistance Sitting-balance support: Feet supported;Single extremity supported;No  upper extremity supported Sitting balance-Leahy Scale: Fair Sitting balance - Comments: intermittent use of UEs to brace    Standing balance support: During functional activity;Single extremity supported Standing balance-Leahy Scale: Poor Standing balance comment: unsteady; UE support to balance                     Cognition Arousal/Alertness: Awake/alert Behavior During Therapy: Anxious Overall Cognitive Status: Within Functional Limits for tasks assessed                      Exercises      General Comments        Pertinent Vitals/Pain Pain Assessment: No/denies pain    Home Living                      Prior Function            PT Goals (current goals can now be found in the care plan section) Acute Rehab PT Goals Patient Stated Goal: to go home PT Goal Formulation: With patient Time For Goal Achievement: 02/25/15 Potential to Achieve Goals: Good Progress towards PT goals: Progressing toward goals    Frequency  Min 3X/week    PT Plan Current plan remains appropriate    Co-evaluation             End of Session Equipment Utilized During Treatment: Gait belt;Oxygen Activity Tolerance: Patient limited by fatigue Patient left: in chair;with call bell/phone within reach;with chair alarm set  Time: 3846-6599 PT Time Calculation (min) (ACUTE ONLY): 26 min  Charges:  $Therapeutic Activity: 23-37 mins                    G CodesGustavus Bryant PT [phone] 02/12/2015, 9:24 AM

## 2015-02-12 NOTE — Progress Notes (Signed)
Chaplain responded to consult.  Chaplain explored fears, anger and anxiety. Patient does not like growing "older".  Chaplain will follow-up , by patient request tomorrow.   Will continue to gain insight from patient and tools that will help him handle the transition from being a young man to becoming   02/12/15 1200  Clinical Encounter Type  Visited With Patient;Health care provider  Visit Type Initial;Spiritual support  Referral From Chaplain;Nurse  Spiritual Encounters  Spiritual Needs Prayer;Emotional   a man of wisdom.

## 2015-02-12 NOTE — Care Management Note (Signed)
Case Management Note  Patient Details  Name: Hector House MRN: [id number] Date of Birth: [birth year]  Subjective/Objective:       Patient is active with [facility] for [facility], [place], will resume.  Notified Farrah with [employer].            Action/Plan:   Expected Discharge Date:                  Expected Discharge Plan:  [place]  In-House Referral:     Discharge planning Services  CM Consult  Post Acute Care Choice:  Resumption of Svcs/PTA Provider Choice offered to:     DME Arranged:    DME Agency:     HH Arranged:  Disease Management, RN, PT HH Agency:  [facility]  Status of Service:     Medicare Important Message Given:    Date Medicare IM Given:    Medicare IM give by:    Date Additional Medicare IM Given:    Additional Medicare Important Message give by:     If discussed at [place] of Stay Meetings, dates discussed:    Additional Comments:  Zenon Mayo, RN 02/12/2015, 3:22 PM

## 2015-02-13 ENCOUNTER — Other Ambulatory Visit: Payer: Self-pay | Admitting: Family Medicine

## 2015-02-13 DIAGNOSIS — E43 Unspecified severe protein-calorie malnutrition: Secondary | ICD-10-CM

## 2015-02-13 DIAGNOSIS — Z8589 Personal history of malignant neoplasm of other organs and systems: Secondary | ICD-10-CM

## 2015-02-13 MED ORDER — ALPRAZOLAM 0.5 MG PO TABS: 0.5000 mg | ORAL_TABLET | Freq: Two times a day (BID) | ORAL | Status: DC

## 2015-02-13 NOTE — Discharge Summary (Signed)
[facility] Discharge Summary  Patient name: Hector House Medical record number: [id number] Date of birth: [birth year] Age: 69 y.o. Gender: male Date of Admission: 02/10/2015  Date of Discharge: 02/13/15 Admitting Physician: Lind Covert, MD  Primary Care Provider: [name], MD Consultants: None  Indication for Hospitalization: shortness of breath  Discharge Diagnoses/Problem List:   Patient Active Problem List   Diagnosis Date Noted  . COPD exacerbation 02/11/2015  . Pressure ulcer 02/11/2015  . Acute respiratory failure with hypercapnia   . Underweight 06/24/2014  . Protein-calorie malnutrition, severe 11/02/2013  . Anxiety disorder due to medical condition 11/01/2013  . 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 History of sarcoma of soft tissue 09/20/2011  . 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 Alcohol use 10/22/2009  . HYPERTENSION, BENIGN ESSENTIAL 09/25/2009  . COPD (chronic obstructive pulmonary disease) with emphysema 09/25/2009  . LOW BACK PAIN SYNDROME 09/25/2009     Disposition: Home  Discharge Condition: Stable  Discharge Exam:  General: Cachectic male sitting up in bed eating breakfast Cardiovascular: RRR, no murmurs appreciated Respiratory: wearing [place]; minimal wheezes bilaterally Abdomen: tenderness to deep palpation of lower quadrants, non-distended,soft, +BS MSK: moving all extremities spontaneously Neuro: A&Ox3, no gross neuro deficits   Brief Hospital Course:   Hector House presented with shortness of breath for three days. His shortness of breath was worse on 07/04, so he called 911. His SOB did not improve en route to the hospital after mag, salumedrol, duonebs, and BiPap. He also became extremely anxious with a CO2 of 70 and was given a dose of Ativan. He was given one dose of azithromycin in the ED as well. He reported cough productive yellow sputum accompanying his SOB. He is on 3L O2 [place] at baseline. CXR obtained  showed emphysematous changes but no signs of infection. He was subsequently admitted and placed on BiPap.   On second day of admission, the patient was transitioned from BiPap to Venturi mask, and to nasal cannula by the end of the day. At that time, he was receiving scheduled DuoNebs q4, formoterol nebs BID, and Pulmicort BID. He was also started on levaquin. With this regimen he was able to maintain O2 saturation between 92-100% on his home amount of 3L [place]. After a day of maintaining appropriate O2 sats on [place] alone, he was discharged without changes to his home medication regimen. We had an extensive discussion of the importance of using all of his medications as prescribed and smoking cessation to prevent further exacerbations and hospitalizations.    Issues for Follow Up:  1. Patient repeatedly voice concerns about receiving his medications on time before running out of pills. I was unable to elicit enough information to determine the cause of this, but it seems he is trying to refill his prescriptions too early, especially Xanax.  2. Hector House was determined to be severely malnourished by nutrition evaluation. It was recommended that he drink Ensure Enlive to meet his nutritional needs.  3. Patient continues to smoke 4 cigarettes daily.   Significant Procedures: None  Significant Labs and Imaging:  CXR (07/04) - hyperinflation consistent with emphysematous changes but no signs of infection BNP (07/04)  - 141 ABG on arrival (07/04) - pH 7.3  Recent Labs Lab 02/10/15 2142 02/11/15 0230  WBC 8.6 8.7  HGB 12.1* 11.3*  HCT 36.6* 34.4*  PLT 174 142*    Recent Labs Lab 02/10/15 2142 02/11/15 0230 02/12/15 0558  NA 136 138 137  K  3.2* 3.2* 3.3*  CL 92* 96* 92*  CO2 35* 30 40*  GLUCOSE 126* 126* 91  BUN 7 6 9   CREATININE 0.70 0.58* 0.65  CALCIUM 8.5* 8.4* 8.6*    Results/Tests Pending at Time of Discharge: None  Discharge Medications:    Medication List    TAKE these  medications        albuterol 108 (90 BASE) MCG/ACT inhaler  Commonly known as:  PROAIR HFA  INHALE 2 PUFFS BY MOUTH EVERY 4 TO 6 HOURS AS NEEDED     ALPRAZolam 0.5 MG tablet  Commonly known as:  XANAX  Take 1 tablet (0.5 mg total) by mouth 2 (two) times daily.     aspirin 81 MG tablet  Take 1 tablet (81 mg total) by mouth daily.     budesonide 0.5 MG/2ML nebulizer solution  Commonly known as:  PULMICORT  Take 2 mLs (0.5 mg total) by nebulization 2 (two) times daily.     busPIRone 10 MG tablet  Commonly known as:  BUSPAR  Take 1 tablet (10 mg total) by mouth 2 (two) times daily.     feeding supplement Liqd  Take 237 mLs by mouth 2 (two) times daily between meals.     formoterol 20 MCG/2ML nebulizer solution  Commonly known as:  PERFOROMIST  Take 2 mLs (20 mcg total) by nebulization 2 (two) times daily.     guaiFENesin 600 MG 12 hr tablet  Commonly known as:  MUCINEX  Take 1 tablet (600 mg total) by mouth 2 (two) times daily as needed.     ipratropium-albuterol 0.5-2.5 (3) MG/3ML Soln  Commonly known as:  DUONEB  INHALE 1 VIAL VIA NEBULIZER FOUR TIMES DAILY, CAN USE AN ADDITIONAL 2 TREATMENTS AS NEEDED     sertraline 50 MG tablet  Commonly known as:  ZOLOFT  TAKE 1 TABLET BY MOUTH DAILY     valsartan-hydrochlorothiazide 160-25 MG per tablet  Commonly known as:  DIOVAN HCT  Take 1 tablet by mouth daily.        Discharge Instructions: Please refer to Patient Instructions section of EMR for full details.  Patient was counseled important signs and symptoms that should prompt return to medical care, changes in medications, dietary instructions, activity restrictions, and follow up appointments.   Follow-Up Appointments: Follow-up Information    Follow up with [facility].   Specialty:  Home Health Services   Why:  [facility], HHPT   Contact information:   [place] [address] [phone]      No results found.   Verner Mould,  MD 02/15/2015, 2:42 PM PGY-1, [place]

## 2015-02-13 NOTE — Progress Notes (Signed)
Physical Therapy Treatment Patient Details Name: TIGHE GITTO MRN: [id number] DOB: 20-Nov-1945 Today's Date: 02/13/2015    History of Present Illness Hector House is a 69 y.o. male presenting with shortness of breath for the past 3 days. PMH is significant for COPD, HTN, Anxiety, low back pain.     PT Comments    Pt making good progress with mobility and agreeable to gait in hallway today with RW.  Note pt VERY limited by anxiety, leading to SOB and increased anxiety.  Discussed several ways to avoid anxiety and stress relief management as well as several energy conservation techniques.  Pt able to state many things that he currently does at home to demonstrate energy conservation.    Follow Up Recommendations  Home health PT;Supervision/Assistance - 24 hour     Equipment Recommendations  None recommended by PT    Recommendations for Other Services       Precautions / Restrictions Precautions Precautions: Fall Precaution Comments: monitor O2 sats, pt extremely anxious with mobility  Restrictions Weight Bearing Restrictions: No    Mobility  Bed Mobility Overal bed mobility: Modified Independent Bed Mobility: Supine to Sit     Supine to sit: Modified independent (Device/Increase time)     General bed mobility comments: use of rails, has hospital bed at home  Transfers Overall transfer level: Needs assistance Equipment used: Rolling walker (2 wheeled) Transfers: Sit to/from Stand Sit to Stand: Supervision         General transfer comment: Min cues for safety and hand placement.   Ambulation/Gait Ambulation/Gait assistance: Supervision Ambulation Distance (Feet): 65 Feet Assistive device: Rolling walker (2 wheeled) Gait Pattern/deviations: Decreased stride length;Trunk flexed Gait velocity: decreased   General Gait Details: Pt agreeable to short distance gait in hallway with use of RW.  S for safety with cues for safe use of RW, esp when turning.  Cues for slow  pursed lip breating.  Note pt very anxious and this tends to cause increased SOB leading to more anxiety.    Stairs            Wheelchair Mobility    Modified Rankin (Stroke Patients Only)       Balance Overall balance assessment: Needs assistance Sitting-balance support: No upper extremity supported Sitting balance-Leahy Scale: Fair Sitting balance - Comments: intermittent use of UEs to brace    Standing balance support: During functional activity Standing balance-Leahy Scale: Fair                      Cognition Arousal/Alertness: Awake/alert Behavior During Therapy: Anxious Overall Cognitive Status: Within Functional Limits for tasks assessed                      Exercises      General Comments        Pertinent Vitals/Pain Pain Assessment: No/denies pain    Home Living                      Prior Function            PT Goals (current goals can now be found in the care plan section) Acute Rehab PT Goals Patient Stated Goal: to go home PT Goal Formulation: With patient Time For Goal Achievement: 02/25/15 Potential to Achieve Goals: Good Progress towards PT goals: Progressing toward goals    Frequency  Min 3X/week    PT Plan Current plan remains appropriate    Co-evaluation  End of Session Equipment Utilized During Treatment: Oxygen Activity Tolerance: Patient limited by fatigue (and anxiety ) Patient left: in bed;with call bell/phone within reach     Time: 1315-1340 PT Time Calculation (min) (ACUTE ONLY): 25 min  Charges:  $Gait Training: 8-22 mins $Self Care/Home Management: 8-22                    G Codes:      Denice Bors 02/13/2015, 1:49 PM

## 2015-02-13 NOTE — Patient Outreach (Signed)
[facility]) Care Management  02/13/2015  ANDRE SWANDER 11-Aug-1945 [id number]   Referral from Natividad Brood, RN, assigned Erenest Rasher, RN and [name], PharmD.  [name]. [place], [place] Management [place] Assistant Phone: [phone] Fax: [phone]

## 2015-02-13 NOTE — Progress Notes (Signed)
Chaplain follow-up  Patient getting ready for discharge.     02/13/15 1500  Clinical Encounter Type  Visited With Patient;Health care provider  Visit Type Follow-up;Spiritual support  Referral From Nurse  Spiritual Encounters  Spiritual Needs Prayer;Emotional

## 2015-02-13 NOTE — Care Management Note (Signed)
Case Management Note  Patient Details  Name: Hector House MRN: [id number] Date of Birth: [birth year]  Subjective/Objective:    Patient is for dc today, patient is active with [place] [facility] and HHPT, patient has home oxygen with [facility] and they will bring an oxygen tank for patient to go home with today.  [facility] referral put in for patient as well.  [place] will see if patient is a candidate.                Action/Plan:   Expected Discharge Date:                  Expected Discharge Plan:  [place]  In-House Referral:     Discharge planning Services  CM Consult  Post Acute Care Choice:  Resumption of Svcs/PTA Provider Choice offered to:  Patient  DME Arranged:  Oxygen DME Agency:  [facility]  HH Arranged:  Disease Management, RN, PT HH Agency:  [place]  Status of Service:  Completed, signed off  Medicare Important Message Given:    Date Medicare IM Given:    Medicare IM give by:    Date Additional Medicare IM Given:    Additional Medicare Important Message give by:     If discussed at [place] of Stay Meetings, dates discussed:    Additional Comments:  Zenon Mayo, RN 02/13/2015, 10:42 AM

## 2015-02-13 NOTE — Telephone Encounter (Signed)
Spoke with Dr. Lonny Prude, looks like progress note has note been completed for today. Will defer to inpatient team.

## 2015-02-13 NOTE — Progress Notes (Signed)
02/13/15 Patient going home after friend comes to pick-up, she has his clothes and oxygen to go home, IV site is out and discharge instructions reviewed with patient.

## 2015-02-13 NOTE — Telephone Encounter (Signed)
Friend called because the patient is in the hospital and missed his appointment yesterday 7/6. He is being released today and the need a refill on his Xanax. They need the dosage increased or a stronger mg's. They need this today since he is being released if we can not get this done today they will not release him. Please call her at [phone] to let her know if she can pick this up. jw

## 2015-02-13 NOTE — Consult Note (Signed)
   [facility] CM Inpatient Consult   02/13/2015  Hector House [birth year] [id number] Patient evaluated for community based chronic disease management services with [place] [facility] Program as a benefit of patient's Medicare Insurance. Met with patient at bedside to explain [place] Management services.   Patient endorses that he is active with Family Medicine at [facility].  This Probation officer called Family Medicine to check patient's status and he has been an active patient at least since 2012. Patient expresses his need for medication management assistance.  He has a neighbor/friend who assist him with some of his with getting groceries, rides to appointments,etc.Patient is a COPD GOLD patient as well.  Consent formed signed and copy given with [facility] folder.      Patient will receive post discharge transition of care call and will be evaluated for monthly home visits for assessments and disease process education.  Left contact information and [facility] literature at bedside. Made Inpatient Case Manager aware that [place] Management following. Of note, [facility] Management services does not replace or interfere with any services that are arranged by inpatient case management or social work.  For additional questions or referrals please contact:  Natividad Brood, RN BSN [facility] Liaison  [phone] business mobile phone

## 2015-02-13 NOTE — Progress Notes (Signed)
Family Medicine Teaching Service Daily Progress Note Intern Pager: [phone]  Patient name: Hector House Medical record number: [id number] Date of birth: 1946-02-09 Age: 69 y.o. Gender: male  Primary Care Provider: [name], MD Consultants: None Code Status: FULL  Pt Overview and Major Events to Date:  07/04 - admitted for SOB 07/05 - transitioned from BiPap to Venturi to nasal cannula   Assessment and Plan: COPD exacerbation: patient still smoking cigarettes per pulmonology but denies during this admission. Has been on formoterol nebs BID, DuoNeb four times a day, Pulmicort BID, and albuterol PRN. On 3L O2 [place] at home. ABG on arrival consistent with respiratory acidosis with compensation with a pH of 7.3. CXR revealed hyperinflation and results consistent with emphysematous changes but no signs of infection. No evidence of fluid overload, but patient does have BNP of 141. Now on [place] with sats between 92-100%.  -Monitor ABG results -Continue monitoring O2 -Continue monitoring respiratory rate -Levaquin 500 mg PO qd -Continue Pulmicort neb BID, prednisone 50 mg PO qd -Duoneb q4 scheduled with Duoneb q2 PRN -On 3L [place], same as home requirement  -D/c today  Hypertension: on ARB-HCTZ at home per pulmonologist  -D/c irbesartan and HCTZ  Anxiety:  -Continue home Xanax -Monitor for sx of anxiety -Zoloft 50 mg qd -Monitor for oversedation  Protein-calorie malnutrition, severe: RD was unable to obtain a nutrition history because Hector House was wearing the venturi mask -Ensure Enlive oral nutrition supplements per RD  FEN/GI: NPO, IV saline locked Prophylaxis: Lovenox  Disposition: home  Subjective:  Hector House reports that he is feeling much better today and can breathe more easily. He is very concerned about not receiving his medications, especially Xanax, when he is supposed to. He believes that his other physicians are punishing him by not allowing him to refill his medications  when necessary. He feels ready to go home, and we discussed the importance of smoking cessation to prevent further COPD exacerbations.   Objective: Temp:  [97.4 F (36.3 C)-98 F (36.7 C)] 98 F (36.7 C) (07/07 1500) Pulse Rate:  [70-89] 89 (07/07 1500) Resp:  [16-24] 24 (07/07 1500) BP: (106-119)/(60-76) 106/60 mmHg (07/07 1500) SpO2:  [96 %-98 %] 96 % (07/07 1500) Physical Exam: General: Cachectic male lying in bed eating breakfast Cardiovascular: RRR, no murmurs appreciated Respiratory: wearing [place]; minimal wheezes bilaterally Abdomen: tenderness to deep palpation of lower quadrants, non-distended,soft, +BS MSK: moving all extremities spontaneously Neuro: A&Ox3, no gross neuro deficits   Laboratory:  Recent Labs Lab 02/10/15 2142 02/11/15 0230  WBC 8.6 8.7  HGB 12.1* 11.3*  HCT 36.6* 34.4*  PLT 174 142*    Recent Labs Lab 02/10/15 2142 02/11/15 0230 02/12/15 0558  NA 136 138 137  K 3.2* 3.2* 3.3*  CL 92* 96* 92*  CO2 35* 30 40*  BUN 7 6 9   CREATININE 0.70 0.58* 0.65  CALCIUM 8.5* 8.4* 8.6*  GLUCOSE 126* 126* 91    Imaging/Diagnostic Tests: Dg Chest Port 1 View  02/10/2015   CLINICAL DATA:  Shortness of breath.  History of emphysema  EXAM: PORTABLE CHEST - 1 VIEW  COMPARISON:  01/16/2015  FINDINGS: Hyperinflation and emphysematous change. There is no edema, consolidation, effusion, or pneumothorax. Normal heart size and mediastinal contours.  IMPRESSION: Emphysema without acute superimposed disease.   Electronically Signed   By: Monte Fantasia M.D.   On: 02/10/2015 22:03    Verner Mould, MD 02/13/2015, 5:17 PM PGY-1, [place] Intern pager: [phone],  text pages welcome

## 2015-02-13 NOTE — Clinical Social Work Note (Signed)
Clinical Social Work Assessment  Patient Details  Name: Hector House MRN: [id number] Date of Birth: 29-Oct-1945  Date of referral:  02/12/15               Reason for consult:  Abuse/Neglect                Permission sought to share information with:    Permission granted to share information::     Name::        Agency::     Relationship::     Contact Information:     Housing/Transportation Living arrangements for the past 2 months:  Single Family Home Source of Information:  Patient Patient Interpreter Needed:  None Criminal Activity/Legal Involvement Pertinent to Current Situation/Hospitalization:  No - Comment as needed Significant Relationships:  Siblings, Friend Lives with:  Self Do you feel safe going back to the place where you live?  Yes Need for family participation in patient care:  No (Coment)  Care giving concerns:  Patient does not report any concerns about returning home.   Social Worker assessment / plan:  CSW met with the patient at bedside to complete assessment. CSW received referral for possible self neglect. Patient appears calm and welcomes CSW. Patient states that he is currently living at home alone. He reports that he he pays two friends $100 per week to clean his home and cook his meals. CSW inquired about the patient's ability to complete his ADLs, he states that he has not had any issues except for sometimes "losing" his medications. He thinks that his pills sometimes fall on the floor when he's taking his medications and that this is why he "comes up short" and runs out of his medicine. CSW discussed ways patient can reduce the likelihood of this happening and has asked RNCM to relay this information to [facility] agency so that [facility] RN can assist patient with this issue. Patient tells CSW about his dog and how this is his companion, it's a Urias russell/boston pit mix. He has a friend looking after the dog while he's in the hospital. CSW inquired about the patient's DC  plan. He states he plans to return home at discharge. CSW does not see any case for self neglect at this time, as the patient reports he is able to meet his basic needs and complete his ADLs either alone or with the assistance of his two friends. CSW signing off at this time.  Employment status:  Disabled (Comment on whether or not currently receiving Disability) Insurance information:    PT Recommendations:  24 Hour Supervision, Home with [place] / Referral to community resources:  [place]  Patient/Family's Response to care:  Patient appears happy with the care he has received here in the hospital. He has no complaints.  Patient/Family's Understanding of and Emotional Response to Diagnosis, Current Treatment, and Prognosis:  Patient appears to have good insight into reason for admission but still continues to smoke "4 cigarettes a day." He understands that this is contributing to his breathing issues. Patient was encouraged to stop.   Emotional Assessment Appearance:  Appears stated age Attitude/Demeanor/Rapport:  Other (Appropriate) Affect (typically observed):  Accepting, Appropriate, Calm, Pleasant Orientation:  Oriented to Self, Oriented to Place, Oriented to  Time, Oriented to Situation Alcohol / Substance use:  Tobacco Use Psych involvement (Current and /or in the community):  No (Comment)  Discharge Needs  Concerns to be addressed:  Other (Comment Required (?Neglect) Readmission within the  last 30 days:  Yes Current discharge risk:  Chronically ill Barriers to Discharge:  Continued Medical Work up   [employer] MSW, [place], [place], [phone]

## 2015-02-13 NOTE — Care Management (Signed)
Important Message  Patient Details  Name: Hector House MRN: [id number] Date of Birth: May 03, 1946   Medicare Important Message Given:  Yes-second notification given    Delorse Lek 02/13/2015, 2:35 PM

## 2015-02-14 ENCOUNTER — Telehealth: Payer: Self-pay | Admitting: Internal Medicine

## 2015-02-14 ENCOUNTER — Telehealth: Payer: Self-pay | Admitting: *Deleted

## 2015-02-14 ENCOUNTER — Other Ambulatory Visit: Payer: Medicare Other

## 2015-02-14 ENCOUNTER — Other Ambulatory Visit: Payer: Self-pay | Admitting: Pharmacist

## 2015-02-14 DIAGNOSIS — J441 Chronic obstructive pulmonary disease with (acute) exacerbation: Secondary | ICD-10-CM

## 2015-02-14 NOTE — Telephone Encounter (Signed)
Prior Authorization received from [place] for Alprazolam.  PA form placed in provider box for completion. Derl Barrow, RN

## 2015-02-14 NOTE — Patient Outreach (Signed)
This RNCM was successful in completing patient's initial transition of care call. Patient identified himself using HIPPA identifiers of date of birth and address. Patient readily agreed to participate with initial assessment.   Patient was [facility] from [facility]  The following was identified as issues affecting patient's chronic disease management:  ADL Deficit: Patient has in home aide which assist with ADLs, IADLs.  Patient gets very short of breath   Medication Compliance: Patient was ordered Diovan as home medications, has not get prescription filled.  [facility].  This RNCM was informed that medication cost $149.99.  Patient's insurance Medicare Part D would not pay without paperwork faxed to Dr. Melvyn Novas.  Called Dr. Gustavus Bryant office to follow up.  Spoke to [place] who was to follow up.  [place] referral made for medication education, Part D assessment. Patient has Medicaid also.  Plan: Home visit scheduled for Wednesday, July 13.    Depression/Anxiety: Patient screened for depression.  Results indicates follow up needed. Patient stated he would gladly follow up with a psychiatrist for further evaluation.

## 2015-02-14 NOTE — Telephone Encounter (Signed)
Left detailed message for Hector House advising her that we did received PA request and it is in process right now. PA given to [name].

## 2015-02-14 NOTE — Patient Outreach (Signed)
Hector House was referred to pharmacy for to assist with medication management needs. Left a HIPAA compliant message on the patient's voicemail. If have not heard from patient by 02/17/15, will give him another call at that time.  [name], PharmD Clinical Pharmacist [place] Management [phone]

## 2015-02-14 NOTE — Patient Outreach (Signed)
[facility]) Care Management  02/14/2015  Hector House [birth year] [id number]   Request from Erenest Rasher, RN to assign Pharmacy, [name], PharmD assigned.  [name]. [place], [place] Management [place] Assistant Phone: [phone] Fax: [phone]

## 2015-02-14 NOTE — Telephone Encounter (Signed)
PA form faxed to [facility] for review.  Process could take 24-72 to process.  Derl Barrow, RN

## 2015-02-15 LAB — CULTURE, BLOOD (ROUTINE X 2)
CULTURE: NO GROWTH
Culture: NO GROWTH

## 2015-02-17 ENCOUNTER — Telehealth: Payer: Self-pay | Admitting: Internal Medicine

## 2015-02-17 ENCOUNTER — Other Ambulatory Visit: Payer: Self-pay | Admitting: Pharmacist

## 2015-02-17 DIAGNOSIS — J432 Centrilobular emphysema: Secondary | ICD-10-CM

## 2015-02-17 MED ORDER — BUDESONIDE 0.5 MG/2ML IN SUSP: 0.5000 mg | Freq: Two times a day (BID) | RESPIRATORY_TRACT | Status: DC

## 2015-02-17 MED ORDER — FORMOTEROL FUMARATE 20 MCG/2ML IN NEBU: 20.0000 ug | INHALATION_SOLUTION | Freq: Two times a day (BID) | RESPIRATORY_TRACT | Status: DC

## 2015-02-17 NOTE — Telephone Encounter (Signed)
Spoke with Benjamine Mola from [facility]. Pt needs his neb meds refilled. I have sent these in. Nothing further needed

## 2015-02-17 NOTE — Patient Outreach (Signed)
Hector House is a 69 y.o. male referred to pharmacy for assistance with getting his medications. Called and spoke with [name] who reports that he feels so much better when he takes his medications, but that he is having trouble getting them. Reports that he is not sure why he has trouble getting his medications. Reports that he knows that he has missed several appointments with his PCP. Reports that he does received help with obtaining his medications from his neighbor down the street. Reports that she also cooks meals for him.  [name] is having trouble reading the labels on his bottles, does not remember how he has obtained some of his medications and is not sure who his Medicare prescription coverage is with. Reports that he is unable to get his card at this time. States that he would appreciate my coming out to meet with him.   Scheduled an appointment to meet with [name] in his home on Wednesday, 02/19/15 at [place], PharmD Clinical Pharmacist [place] Management [phone]

## 2015-02-17 NOTE — Patient Outreach (Signed)
Called [facility] pul[facility], Dr. Gustavus Bryant, office to confirm that Hector House should be continuing to use the formoterol and budesonide nebulization solutions and, if so, to let the provider know that per [facility] he does not have active refills for these and per the patient, he does not have much left of either of these.  Spoke with Hector House who reported that the patient is to continue on both the formoterol and budesonide. Stated that she would send in new prescriptions for these to the patient's pharmacy, [facility], today.  Hector House, PharmD Clinical Pharmacist [place] Management [phone]

## 2015-02-17 NOTE — Telephone Encounter (Signed)
Medication is covered under patient's insurance.  [facility] informed that they can call the pharmacy help line if they have problems processing the claim.  Derl Barrow, RN

## 2015-02-17 NOTE — Patient Outreach (Signed)
Called [facility] pharmacy, [facility], to determine when Mr. Townley most recently had his medications refilled. The pharmacist provided the following last refill dates:  -alprazolam on 02/14/15 -buspirone on 01/27/15 -Duoneb filled today & waiting for patient to pick up -sertraline on 02/02/15 -valsartan/HCTZ not filled, as it is requiring a prior authorization. Reports having faxed MD  Pharmacist reports that his medication is being billed through [facility].  [name], PharmD Clinical Pharmacist [place] Management [phone]

## 2015-02-18 ENCOUNTER — Other Ambulatory Visit: Payer: Self-pay | Admitting: Internal Medicine

## 2015-02-18 ENCOUNTER — Telehealth: Payer: Self-pay | Admitting: *Deleted

## 2015-02-18 MED ORDER — IRBESARTAN 75 MG PO TABS: 75.0000 mg | ORAL_TABLET | Freq: Every day | ORAL | Status: DC

## 2015-02-18 NOTE — Telephone Encounter (Signed)
Hector Pique do you have this?

## 2015-02-18 NOTE — Telephone Encounter (Signed)
Prefer ibesartan over losartan but if only cover the losartan that's' fine  Dose of ibesartan is 75 Dose of losartan is 100

## 2015-02-18 NOTE — Telephone Encounter (Signed)
Dr. Melvyn Novas would you consider changing Valsartan/HCTZ to either Losartan? Pt has [place] Medicaid and Valsartan is not covered.

## 2015-02-18 NOTE — Telephone Encounter (Signed)
Called and spoke to [place] at [place] and pt's perforomist is covered by insurance and does not need a PA. PA was sent to our office because the most recent refill was sent to local pharmacy. Pt has 11 months on his perforomist through DME. Nothing further needed with perforomist.   Dr. Melvyn Novas please advise if you are ok changing from Diovan to losartan?

## 2015-02-18 NOTE — Telephone Encounter (Signed)
1st Called [facility] to verify medication ran under patient's Part B. Yes Called [phone] to initiate PA for Perforomist 20 mcg. Spoke to [facility] who says it should be covered under Part B. Called 1-800-medicare; who gave me1 [phone]. Will call back on tomorrow to complete PA;

## 2015-02-18 NOTE — Telephone Encounter (Signed)
Called made pt aware we are changing BP med diovan to irbesartan. Nothing further needed

## 2015-02-18 NOTE — Telephone Encounter (Signed)
Should not need PA for Part B medicare filing rec let DME company and [facility] work out rx

## 2015-02-19 ENCOUNTER — Other Ambulatory Visit: Payer: Self-pay | Admitting: Pharmacist

## 2015-02-19 ENCOUNTER — Other Ambulatory Visit: Payer: Self-pay

## 2015-02-19 ENCOUNTER — Telehealth: Payer: Self-pay | Admitting: Internal Medicine

## 2015-02-19 MED ORDER — BUDESONIDE 0.5 MG/2ML IN SUSP: 0.5000 mg | Freq: Two times a day (BID) | RESPIRATORY_TRACT | Status: AC

## 2015-02-19 NOTE — Telephone Encounter (Signed)
Per 02/18/15 phone note [facility] received RX for pt neb medication perforomist and will come from DME. Will call [facility] to to confirm called and spoke with Rodena Piety from [place] and was told they don't show pt active in system. According to epic this was sent to [facility] not [facility]. Called [facility] and spoke with Jeani Hawking. They had RX for performist but not budesonide. This will need to be [facility]. I have done so. Nothing further needed

## 2015-02-19 NOTE — Patient Outreach (Signed)
[place] [facility]) Care Management  02/19/2015  Hector House May 04, 1946 [id number]   This RNCM met with patient for initial home visit to assess for [facility] Management needs.   Patient was agreeable to home visit, responded appropriately to [place] identifiers. Patient was very winded upon my arrival to his home.  Patient stated he had been in the bathroom off and on all day because he was having constipation.  Patient was able to recorder by using Purse Lip Breathing technique.  Patient and this RNCM was able to discuss advanced planning with patient using the EMMI Video as a guide.  Patient states he does not have anything written down, however, he has told his one and only one he could have all his belongings.  He stated he has already given his son his new Camaro, because he (patient) can no longer drive.  Patient reports his support system includes come friends, his one and only son and his neighbor who lives down the street and is very active in his care.  His neigbor went to the pharmacy while this RNCM was there to get some stool softeners for patient.

## 2015-02-19 NOTE — Telephone Encounter (Signed)
Medication changed from Diovan to [place] on 7/12 by MW. Nothing further needed.

## 2015-02-19 NOTE — Patient Outreach (Signed)
[facility]) Care Management  [place]   02/19/2015  Hector House [birth year] [id number]  Subjective: "I have been to the hospital four times in the past six months or so."  Hector House is a 69 y.o. gentleman referred to pharmacy for assistance with obtaining and managing his medications.  Patient is a current smoker. Reports that he has cut back to 3-4 cigarettes per day. Triggers include his anxiety and meals, wants one after eating. In the past quit for five months by going "cold [place]", but started again when hanging around a friend who smoked. Let patient know that I am available to help him with his smoking cessation and asked him to reach out to me when he is ready to quit.  Reports that he is constipated, no bowel movement in 3 days. Reports that he sometimes goes a day or two without drinking water, just drinking soda instead. Discussed the importance of drinking water, rather than soda, throughout the day. Patient verbalizes understanding and sits here drinking a bottle of water. During our visit, patient rushes to the bathroom twice and reports that he had some success in voiding his bowels. Reports that he is afraid to use something that would make him need to go urgently. Discussed using Miralax to relieve his constipation.   Reports that he has been storing his fomoterol in the fridge. Reports that this makes it hard for him to remember to take it. Explained to patient that this medication can be kept at room temperature for up to 3 months. Counseled patient to keep it next to his bed and nebulizer, where he can more easily access it and remember to take it. Verify with patient that this room would never get above 77 degrees.  Observe patient using his Proair inhaler while I am there. Note that patient takes two puffs in rapid succession. Counseled patient about the importance of inhaling one puff at a time, holding his breath after each inhalation to allow  the medicine get deeply into his lungs. Patient verbalizes understanding. However, when he uses the inhaler again in my presence, he again does two puffs in rapid succession. Again, counseled patient about technique. Patient reports that he just gets so anxious that he does it quickly. Viewed COPD Medications and Inhalers EMMI with the patient. Patient stated that this helped him to understand how his medications work.  As patient reports that he has not taken his fomoterol or budesonide this morning, advise that he does so at this time. Patient starts his fomoterol, but then has an attack of anxiety. I turn off the nebulizer machine and the patient does some deep breathing exercises. When patient reports that the anxiety has passed, he resumes and completes the fomoterol nebulization solution. Patient reports that he takes his alprazolam twice daily, it is in his pillbox. However, states that sometimes his anxiety gets so bad that he needs another during the day so that he can breathe. Advised patient to discuss his using an additional dose with Dr. Lonny Prude at their appointment on Thursday so that the doctor is aware that the twice daily dosing is not always enough to control his anxiety.  Patient has a pillbox that has four slots/day. Uses two slots, morning and evening. Note that today is Wednesday and he currently only has Saturday morning and Friday and Saturday evening doses left. Spoke with patient about this. Reports that his neighbor [name] fills the box for him. Reports that he sometimes  has trouble remembering what day it is. Reports that he probably has taken multiple doses, forgetting that he had already taken the medicine, and not remembering what day of the week it was. Acknowledges that this is probably part of why he is frequently running out of medications. Discussed with patient the possibility of using a medication alarm that would let him know the day and remind him to take his medications.  Patient is very interested in this tool. Let patient know that we are ordering them at the office and that I will bring and setup one for him as soon as they come in.   During our visit, patient's neighbor and caregiver, [name], stops by. She brings him food and does some cleaning around the house. Ask [name] if it is Okay for me to talk to [name] about his medications and health. [name] states that this is fine. [name] reports that she is about to go to the pharmacy to pick up the patient's new blood pressure medication. We also ask for her to get him some generic Miralax for constipation. Write down the medication name on a piece of paper, also review administration instructions with the patient and [name]. Provide [name] with my business card before she leaves.  Patient has a bottle of Augmentin, filled 01/17/15, with direction Take 1 tablet by mouth every 8 hours. Reports that he has been taking this once a day. Reports that he thought that this was for his potassium. Note per EPIC hospital discharge note 01/16/15, this medication was given to him at discharge. Advised patient to stop taking this daily at this time. Patient is also taking guaifenesin with phenylephrine. Patient was not aware that one contained phenylephrine. Patient reports that he is using this when he has chest congestion, not for any nasal congestion. Let patient know that the phenylephrine is not providing him any benefit, but may raise his blood pressure. Advised patient to instead take plain guaifenesin if needed for chest congestion and counseled patient to take this with a full glass of water.   Objective:   Current Medications: Current Outpatient Prescriptions  Medication Sig Dispense Refill  . albuterol (PROAIR HFA) 108 (90 BASE) MCG/ACT inhaler INHALE 2 PUFFS BY MOUTH EVERY 4 TO 6 HOURS AS NEEDED 18 g 2  . ALPRAZolam (XANAX) 0.5 MG tablet Take 1 tablet (0.5 mg total) by mouth 2 (two) times daily. 60 tablet 0  .  aspirin 81 MG tablet Take 1 tablet (81 mg total) by mouth daily. 30 tablet 5  . budesonide (PULMICORT) 0.5 MG/2ML nebulizer solution Take 2 mLs (0.5 mg total) by nebulization 2 (two) times daily. DX J43.2 120 mL 5  . busPIRone (BUSPAR) 10 MG tablet Take 1 tablet (10 mg total) by mouth 2 (two) times daily. 60 tablet 5  . feeding supplement (BOOST HIGH PROTEIN) LIQD Take 237 mLs by mouth 2 (two) times daily between meals. 60 Can 11  . formoterol (PERFOROMIST) 20 MCG/2ML nebulizer solution Take 2 mLs (20 mcg total) by nebulization 2 (two) times daily. DX j43.2 120 mL 5  . guaiFENesin (MUCINEX) 600 MG 12 hr tablet Take 1 tablet (600 mg total) by mouth 2 (two) times daily as needed. (Patient taking differently: Take 600 mg by mouth 2 (two) times daily as needed for cough or to loosen phlegm. ) 20 tablet 2  . ipratropium-albuterol (DUONEB) 0.5-2.5 (3) MG/3ML SOLN INHALE 1 VIAL VIA NEBULIZER FOUR TIMES DAILY, CAN USE AN ADDITIONAL 2 TREATMENTS AS  NEEDED 1620 mL 11  . irbesartan (AVAPRO) 75 MG tablet TAKE 1 TABLET BY MOUTH DAILY 90 tablet 1  . sertraline (ZOLOFT) 50 MG tablet TAKE 1 TABLET BY MOUTH DAILY 90 tablet 2  . valsartan-hydrochlorothiazide (DIOVAN HCT) 160-25 MG per tablet Take 1 tablet by mouth daily. (Patient not taking: Reported on 02/14/2015) 30 tablet 11   No current facility-administered medications for this visit.    Functional Status: In your present state of health, do you have any difficulty performing the following activities: 02/14/2015 02/14/2015  Hearing? - Y  Vision? - Y  Difficulty concentrating or making decisions? - Y  Walking or climbing stairs? - Y  Dressing or bathing? - Y  Doing errands, shopping? - Y  Preparing Food and eating ? - Y  Using the Toilet? - N  In the past six months, have you accidently leaked urine? N -  Do you have problems with loss of bowel control? N N  Managing your Medications? - Y  Managing your Finances? - N  Housekeeping or managing your  Housekeeping? - Y    Fall/Depression Screening: PHQ 2/9 Scores 02/14/2015 12/16/2014 07/17/2014 07/17/2014 12/21/2013 11/23/2013 11/23/2013  PHQ - 2 Score 4 0 4 0 0 3 0  PHQ- 9 Score 18 - 14 - - - -  Exception Documentation Other- indicate reason in comment box - - - - - -    Assessment:  Patient non-adherent to oral medication and nebulizer regimen. Patient appears to be double dosing with his oral medications, due to forgetting that he has already taken a dose and not being aware of what day of the week it is. Patient not remembering to take his nebulizer solutions.  Patient not using proper inhaler technique with his Proair inhaler.  Patient using alprazolam more than twice daily as directed due to reported uncontrolled anxiety.   Plan:  Mr. Welshans to see Dr. Lonny Prude at the appointment on Thursday morning. At appointment, patient to discuss his anxiety control and his occasional use of an additional dose of alprazolam for anxiety control.  Will follow up with patient on 02/21/15 about Proair inhaler technique (reminding him to take the time to hold his breath after each puff) and to make sure that he is using his fomoterol, budesonide and ipratropium/albuterol nebulizer solutions as scheduled.  Will bring and setup a medication alarm for Mr. Ericsson as soon as this order comes in.  [name], [facility] [phone]

## 2015-02-20 ENCOUNTER — Encounter: Payer: Self-pay | Admitting: Family Medicine

## 2015-02-20 ENCOUNTER — Ambulatory Visit (INDEPENDENT_AMBULATORY_CARE_PROVIDER_SITE_OTHER): Payer: Medicare Other | Admitting: Family Medicine

## 2015-02-20 VITALS — BP 118/71 | HR 93 | Temp 99.3°F | Ht 67.0 in | Wt 95.1 lb

## 2015-02-20 DIAGNOSIS — I10 Essential (primary) hypertension: Secondary | ICD-10-CM

## 2015-02-20 DIAGNOSIS — J432 Centrilobular emphysema: Secondary | ICD-10-CM | POA: Diagnosis not present

## 2015-02-20 DIAGNOSIS — K5900 Constipation, unspecified: Secondary | ICD-10-CM | POA: Diagnosis not present

## 2015-02-20 DIAGNOSIS — J441 Chronic obstructive pulmonary disease with (acute) exacerbation: Secondary | ICD-10-CM

## 2015-02-20 DIAGNOSIS — F064 Anxiety disorder due to known physiological condition: Secondary | ICD-10-CM

## 2015-02-20 DIAGNOSIS — L609 Nail disorder, unspecified: Secondary | ICD-10-CM

## 2015-02-20 DIAGNOSIS — F418 Other specified anxiety disorders: Secondary | ICD-10-CM

## 2015-02-20 DIAGNOSIS — L602 Onychogryphosis: Secondary | ICD-10-CM

## 2015-02-20 MED ORDER — CLONAZEPAM 0.25 MG PO TBDP: 0.2500 mg | ORAL_TABLET | Freq: Two times a day (BID) | ORAL | Status: DC

## 2015-02-20 MED ORDER — POLYETHYLENE GLYCOL 3350 17 G PO PACK: 17.0000 g | PACK | Freq: Every day | ORAL | Status: AC

## 2015-02-20 NOTE — Progress Notes (Signed)
    Subjective    PER BEAGLEY is a 69 y.o. male that presents for a follow-up visit for chronic issues.   1. COPD exacerbation: He has been doing well since being discharged. He is using his albuterol about 3-4 times per day when he has all his anxiety medications.   2. Anxiety: Controlled for about 2 hours with anxiety. Anxiety significantly impairs patient's breathing.   3. Constipation: States he usually has bowel movements every day. Recently he hadn't had any bowel movements for 3-4 days but had one last that was difficult.   4. Hypertension: recently changed to irbesartan. No chest pain.   History  Substance Use Topics  . Smoking status: Former Smoker -- 2.00 packs/day for 51 years    Types: Cigarettes    Quit date: 08/09/2014  . Smokeless tobacco: Never Used  . Alcohol Use: Yes     Comment: 02/11/2015 "stopped drinking in Jan"     No Known Allergies  No orders of the defined types were placed in this encounter.    ROS  Per HPI   Objective   BP 118/71 mmHg  Pulse 93  Temp(Src) 99.3 F (37.4 C) (Oral)  Ht 5\' 7"  (1.702 m)  Wt 95 lb 1 oz (43.12 kg)  BMI 14.89 kg/m2  SpO2 94%  General: Fair appearing, chronically ill Respiratory/Chest: Decreased breath sounds diffusely. Minimal wheezing, speaks mostly in complete sentences  Assessment and Plan   Please refer to problem based charting of assessment and plan

## 2015-02-20 NOTE — Patient Instructions (Signed)
Thank you for coming to see me today. It was a pleasure. Today we talked about:   Anxiety: I am switching you to Klonipin 0.25mg  twice per day. Stop taking Xanax. Do not ever take these two medications together.  Hypertension: blood pressure controlled  Toenails: I will refer you to the podiatrist  Constipation: I will prescribe Miralax  Please make an appointment to see me in 3 months for follow-up.  If you have any questions or concerns, please do not hesitate to call the office at [phone].  Sincerely,  [name], MD

## 2015-02-20 NOTE — Patient Outreach (Deleted)
[facility]) Care Management  January 20, 2015  Hector House [birth year] [id number]   This RNCM arrived at patient home for this intial home vist. Patient was agreeable to initial community case management assessment after he satisfied HIPPA identifer information.   Patient

## 2015-02-21 ENCOUNTER — Other Ambulatory Visit: Payer: Self-pay | Admitting: Pharmacist

## 2015-02-21 NOTE — Patient Outreach (Signed)
Called to follow up with patient on about Proair inhaler technique (reminding him to take the time to hold his breath after each puff) and to make sure that he is using his fomoterol, budesonide and ipratropium/albuterol nebulizer solutions as scheduled. However, patient does not answer and does not currently have voicemail. Will try again to reach the patient on 02/24/15.  [name], PharmD Clinical Pharmacist [place] [facility] [phone]

## 2015-02-24 ENCOUNTER — Telehealth: Payer: Self-pay | Admitting: *Deleted

## 2015-02-24 MED ORDER — CLONAZEPAM 0.5 MG PO TABS: 0.2500 mg | ORAL_TABLET | Freq: Two times a day (BID) | ORAL | Status: AC

## 2015-02-24 NOTE — Telephone Encounter (Signed)
Left voice message [name] that a new Rx for called into patient's pharmacy per Dr. Lonny Prude.  Klonopin 0.5 mg tablet; take 1/2 tablet twice daily, #15, 2 refills.  Derl Barrow, RN

## 2015-02-24 NOTE — Telephone Encounter (Signed)
Pts wife called, the klonopin was ordered as disintegrating tablets and they "melt before he can get them out of the packet and take them" Wants Dr. Lonny Prude to write a new script and she will come to pick up.  She wants Dr. Lonny Prude to know that the klonopin is helping and the patient "actually gets up and walks around when we are able to get the medication to him without it melting". Fleeger, Salome Spotted

## 2015-02-25 DIAGNOSIS — K5900 Constipation, unspecified: Secondary | ICD-10-CM | POA: Insufficient documentation

## 2015-02-25 NOTE — Assessment & Plan Note (Signed)
Miralax until regular soft stools

## 2015-02-25 NOTE — Assessment & Plan Note (Signed)
Improved with Xanax, however not lasting long enough. This issue seriously complicates patient's COPD symptoms.  Discontinue Xanax  Start Klonopin 0.25mg  BID

## 2015-02-25 NOTE — Assessment & Plan Note (Signed)
Currently following up with pulmonology. Symptoms worse with anxiety. He has been doing well since discharge from hospital.

## 2015-02-25 NOTE — Assessment & Plan Note (Signed)
Controlled. No symptoms. Adherent with medication regimen. No changes

## 2015-02-26 ENCOUNTER — Other Ambulatory Visit: Payer: Self-pay | Admitting: Pharmacist

## 2015-02-26 NOTE — Patient Outreach (Signed)
Called to follow up with patient on about Proair inhaler technique (reminding him to take the time to hold his breath after each puff) and to make sure that he is using his fomoterol, budesonide and ipratropium/albuterol nebulizer solutions as scheduled. However, patient does not answer and does not currently have voicemail. This was call attempt #2.  Also called patient's sister, [name], and left a HIPAA compliant message with her for the patient. If have not heard from patient by 02/28/15, will give her another call at that time.    [name], PharmD Clinical Pharmacist [facility] [phone]

## 2015-02-28 ENCOUNTER — Other Ambulatory Visit: Payer: Self-pay | Admitting: Licensed Clinical Social Worker

## 2015-02-28 ENCOUNTER — Other Ambulatory Visit: Payer: Self-pay | Admitting: Pharmacist

## 2015-02-28 ENCOUNTER — Telehealth: Payer: Self-pay | Admitting: *Deleted

## 2015-02-28 ENCOUNTER — Encounter: Payer: Self-pay | Admitting: Pharmacist

## 2015-02-28 ENCOUNTER — Encounter: Payer: Self-pay | Admitting: Clinical

## 2015-02-28 ENCOUNTER — Other Ambulatory Visit: Payer: Self-pay

## 2015-02-28 DIAGNOSIS — J441 Chronic obstructive pulmonary disease with (acute) exacerbation: Secondary | ICD-10-CM

## 2015-02-28 NOTE — Patient Outreach (Signed)
Called to follow up with patient's primary care physician, Dr. Lonny Prude, to let him know about the patient's blood pressure medication duplication and about patient's non adherence related to his memory and anxiety.  Left a message. If I have not heard back from Dr. Lonny Prude by the end of the day, will call again at that time.  [name], PharmD Clinical Pharmacist [facility] [phone]

## 2015-02-28 NOTE — Patient Outreach (Signed)
[facility]) Care Management  [place]   02/28/2015  JAYE POLIDORI 03/04/1946 [id number]  Subjective: Hector House is a 69 y.o. gentleman referred to pharmacy for medication management. Previously met with Hector House on 02/19/15. Have made three attempts to reach patient for follow up via phone, including once today, so I am presenting today to his home to follow up. Hector House answers the door and reports that he has just woken up.  Ask the patient about his phone, as it has been off each time that I have called. Patient reports that he has an Art therapist phone" and has run out of minutes. Reports that it will be back on in a week. Going to have a landline, Hector House is handling this.  Hector House reports that he has been taking his nebulizer solutions regularly. Reports that he has poured his Perforomist and begins taking this. Reports that he thinks that this nebulizer solution has really been helping with his breathing. Ask Hector House about his Proair inhaler technique. Hector House reports without prompting that he has been holding his breath after each puff. I ask Hector House to see his pillbox. He states that it has fallen under his bed and he was unable to get to it. When I retrieve it, note that it is empty. Hector House reports that his neighbor, Hector House, who usually fills the box, has not because she is busy also caring for her brother. Patient reports that he has just been taking his medications from the bottles. Offer to fill the pillbox for Hector House, but as I look at his pillboxes, note that he has both a bottle of irbesartan 75 mg to be taken once daily, filled 02/18/15 and a bottle of lisinopril/HCTZ 20-25 mg to be taken 1 tablet by mouth daily, filled 02/25/15. Patient reports that he has been taking both. Take patient's blood pressure. It is currently 91/60, with a pulse of 97. He reports that he is not dizzy and has not fallen recently. Aware that patient had been switched from  lisinopril/HCTZ to irbesartan by his pulmonologist, Dr. Melvyn House per his EPIC note. Call patient's PCP office to confirm that patient was not instructed to switch back. Per Hector House at [facility], patient has not been restarted on the Lisinopril/HCTZ and should only be taking the irbesartan. Call [facility] and speak with Hector House, pharmacist. Rob reports that the medication was filled because it was on autorefill. States that they will not fill it again. Let Hector House know that he is only to be taking the irbesartan for blood pressure. Patient verbalizes understanding and together we throw out the bottle of lisinopril/HCTZ.   Note that patient's new prescription for clonazepam is not among his other bottles. Patient reports that he is not aware of where it currently is, and asks me to call his neighbor Hector House. Contact Cathy. Hector House reports that she is very concerned about the patient. Reports that she has been trying to help him to be compliant by helping to fill his pillbox and then checking up on him. However, reports that he is taking too many doses. Reports that she had set up his pillbox yesterday for him for his doses for Thursday morning, Thursday evening, Friday morning and Friday evening. When she checked in on the patient this morning, reports that all doses were gone and that the patient reported that he thinks that he had taken them around midnight before he went to bed. Reports that because  of that, she set the clonazepam aside and will now try stopping by and giving his doses each morning and evening. Spend some time with Hector House, listening as she voices her concerns about caring for Hector House. Reports that it has become more than she expected. She had planned to just help him by cleaning and picking up his medications from the pharmacy. Reports that now she has started to feel responsible for making sure that he is taking his medications correctly, but that she knows that he is taking extra  doses, but does not feel that she can take his medications away from him. Reports that she believes that when he gets anxious, he just takes any of his oral medications that are around because he forgets what they are for. Expresses that she needs help.  Return to speaking with Hector House. Remind Hector House that Hector House is currently giving him his medications and that she has already given him his medications this morning. Remind the patient to not take extra doses of his medications. Counsel patient about the importance of getting up carefully when he rises from his bed and walks. Check patient's blood pressure again before I left, it is 107/86 Pulse 92   Objective:   Current Medications: Current Outpatient Prescriptions  Medication Sig Dispense Refill  . albuterol (PROAIR HFA) 108 (90 BASE) MCG/ACT inhaler INHALE 2 PUFFS BY MOUTH EVERY 4 TO 6 HOURS AS NEEDED 18 g 2  . aspirin 81 MG tablet Take 1 tablet (81 mg total) by mouth daily. 30 tablet 5  . budesonide (PULMICORT) 0.5 MG/2ML nebulizer solution Take 2 mLs (0.5 mg total) by nebulization 2 (two) times daily. DX J43.2 120 mL 5  . busPIRone (BUSPAR) 10 MG tablet Take 1 tablet (10 mg total) by mouth 2 (two) times daily. 60 tablet 5  . clonazePAM (KLONOPIN) 0.5 MG tablet Take 0.5 tablets (0.25 mg total) by mouth 2 (two) times daily. 15 tablet 2  . feeding supplement (BOOST HIGH PROTEIN) LIQD Take 237 mLs by mouth 2 (two) times daily between meals. 60 Can 11  . formoterol (PERFOROMIST) 20 MCG/2ML nebulizer solution Take 2 mLs (20 mcg total) by nebulization 2 (two) times daily. DX j43.2 120 mL 5  . guaiFENesin (MUCINEX) 600 MG 12 hr tablet Take 1 tablet (600 mg total) by mouth 2 (two) times daily as needed. (Patient taking differently: Take 600 mg by mouth 2 (two) times daily as needed for cough or to loosen phlegm. ) 20 tablet 2  . ipratropium-albuterol (DUONEB) 0.5-2.5 (3) MG/3ML SOLN INHALE 1 VIAL VIA NEBULIZER FOUR TIMES DAILY, CAN USE AN ADDITIONAL  2 TREATMENTS AS NEEDED 1620 mL 11  . irbesartan (AVAPRO) 75 MG tablet TAKE 1 TABLET BY MOUTH DAILY (Patient not taking: Reported on 02/19/2015) 90 tablet 1  . polyethylene glycol (MIRALAX / GLYCOLAX) packet Take 17 g by mouth daily. 14 each 0  . sertraline (ZOLOFT) 50 MG tablet TAKE 1 TABLET BY MOUTH DAILY 90 tablet 2   No current facility-administered medications for this visit.    Functional Status: In your present state of health, do you have any difficulty performing the following activities: 02/14/2015 02/14/2015  Hearing? - Y  Vision? - Y  Difficulty concentrating or making decisions? - Y  Walking or climbing stairs? - Y  Dressing or bathing? - Y  Doing errands, shopping? - Y  Preparing Food and eating ? - Y  Using the Toilet? - N  In the past six months, have you  accidently leaked urine? N -  Do you have problems with loss of bowel control? N N  Managing your Medications? - Y  Managing your Finances? - N  Housekeeping or managing your Housekeeping? - Y    Fall/Depression Screening: PHQ 2/9 Scores 02/20/2015 02/14/2015 12/16/2014 07/17/2014 07/17/2014 12/21/2013 11/23/2013  PHQ - 2 Score 1 4 0 4 0 0 3  PHQ- 9 Score - 18 - 14 - - -  Exception Documentation - Other- indicate reason in comment box - - - - -    Assessment:  Patient is currently non adherent to his medications due to his memory and his response to his anxiety.   Patient has been receiving too much blood pressure medication for the past few days, both because he was dispensed the lisinopril/HCTZ that he is no longer supposed to be taking and because it appears that he has taken too many doses/day.  Plan:  Will call Nurse Care Manager Loni Muse to discuss the patient's blood pressure, his non adherence and about his caregiver's concerns that she needs assistance with this patient.  Will call patient's primary care physician, Dr. Lonny Prude, to let him know about the blood pressure medication duplication and about patient's  non adherence related to his memory and anxiety.  Will follow up with Mr. Roper at his home on Monday morning. Will make a large chart for the patient to help him remember what each of his medications are for and how he is to take them.  Will follow up with Mr. Fogleman as soon as the medication alarm has come in. At that time, will set this up for the patient.  [name], PharmD Clinical Pharmacist [facility] [phone]

## 2015-02-28 NOTE — Patient Outreach (Signed)
[facility]) Care Management  02/28/2015  Hector House 04-23-1946 [id number]   Assessment-CSW received referral from Hector House due to patient needing more care giver assistance. CSW met RNCM Hector House at home of patient on 02/28/15. HIPPA verifications received. Patient reported that "when I can't breathe I will take any medication that I see to help because I do not have time to figure out which meds are for what." CSW and RNCM expressed their concerns for patient's wellbeing as he took wrong medication recently. Patient reports that his anxiety increases whenever he can not breathe. Patient's caregiver and emergency contact, Hector House then arrived at patient's home. Hector House states that it would help to get extra care giving assistance through his Medicaid as she has to care give for another individual and can't be with patient throughout the entire day. Then patient agrees to [facility] services. RNCM Hector House sent out in [employer] staff message to patient's PCP social worker. Patient thankful for [facility] services.  Plan-CSW scheduled home visit for 03/10/15 to complete assessment.          Hector House staff will closely monitor patient.  Hector House, BSW, MSW, [email or web address] Phone: [phone] Fax: [phone]

## 2015-02-28 NOTE — Telephone Encounter (Signed)
Hector House, Clinical Pharmacist with [facility] requesting to speak with patient's PCP regarding medication management. Patient had home visit today from Hector House.  Patient was taking two different blood pressure medications; lisinopril-HCTZ and Irbesartan.  Lisinopril-HCTZ was discontinued by the pulmonologist, but patient was still taking it.  Hector House discard the lisinopril.  Pt also has neighbors that try to help manage his medications. A neighbor had placed his medication in his pill box for the AM and PM.  Patient took all the medication sometime around midnight.  PCP was informed and will give [facility] call.  Please call [phone].

## 2015-02-28 NOTE — Telephone Encounter (Signed)
Discussed home visit. Patient was blood pressure in 90/60s with pulse in 90s. Patient seems to be taking extra medications than needed, especially when anxious. [facility] nurse setting patient up with PCS services and will await paperwork to fill out.

## 2015-02-28 NOTE — Progress Notes (Signed)
CSW informed by [facility] RNCM that pt is requesting PCS. CSW has placed a PCS form in PCP's box for completion.  Hunt Oris, MSW, [place]

## 2015-02-28 NOTE — Patient Outreach (Signed)
[facility]) Care Management  02/28/2015  Hector House 02-16-1946 [id number]   Acute care visit coordinated with Eula Fried after receiving call from [facility], [facility] expressing some concerns regarding patient's medication management.    Patient and his caregiver, [name], informed this RNCM they think Benjamine Mola got the medication straightened out. Patient stated when he gets anxious, he starts taking pills. This RNCM reinforced patient's medication for anxiety/depression , Klonopin, Zoloft and    This RNCM reinforced mediation administration.  Also reinforced patient regimen for nebulizer and inhalers as outlined by Tommy Rainwater.    Patient agreed to [facility] services. Request sent to Hunt Oris, LCSW, Family Practice to assist with completing referral paperwork.    Patient refuses placement in long term care at this time. Patient and caregiver, [name], state their understanding of medication regimen and agrees to meet with Eula Fried, [facility] LCSW for psychosocial support.    Plan: Home visit on March 11, 2015

## 2015-02-28 NOTE — Patient Outreach (Signed)
Called to follow up with Nurse Care Manager Loni Muse to discuss the patient's blood pressure, his non adherence and about his caregiver's concerns that she needs assistance with caring for the patient.  Pam states that she will follow up with the patient by seeing him today.  [name], PharmD Clinical Pharmacist [place] [facility] [phone]

## 2015-03-02 ENCOUNTER — Other Ambulatory Visit: Payer: Self-pay | Admitting: Family Medicine

## 2015-03-03 ENCOUNTER — Encounter (HOSPITAL_COMMUNITY): Payer: Self-pay | Admitting: Emergency Medicine

## 2015-03-03 ENCOUNTER — Other Ambulatory Visit: Payer: Self-pay | Admitting: Pharmacist

## 2015-03-03 ENCOUNTER — Emergency Department (HOSPITAL_COMMUNITY): Payer: Medicare Other

## 2015-03-03 ENCOUNTER — Inpatient Hospital Stay (HOSPITAL_COMMUNITY)
Admission: EM | Admit: 2015-03-03 | Discharge: 2015-03-10 | DRG: 871 | Disposition: E | Payer: Medicare Other | Attending: Family Medicine | Admitting: Family Medicine

## 2015-03-03 DIAGNOSIS — F05 Delirium due to known physiological condition: Secondary | ICD-10-CM | POA: Diagnosis not present

## 2015-03-03 DIAGNOSIS — J441 Chronic obstructive pulmonary disease with (acute) exacerbation: Secondary | ICD-10-CM | POA: Diagnosis present

## 2015-03-03 DIAGNOSIS — R64 Cachexia: Secondary | ICD-10-CM | POA: Diagnosis present

## 2015-03-03 DIAGNOSIS — Z923 Personal history of irradiation: Secondary | ICD-10-CM | POA: Diagnosis not present

## 2015-03-03 DIAGNOSIS — Z66 Do not resuscitate: Secondary | ICD-10-CM | POA: Diagnosis present

## 2015-03-03 DIAGNOSIS — R0602 Shortness of breath: Secondary | ICD-10-CM

## 2015-03-03 DIAGNOSIS — R410 Disorientation, unspecified: Secondary | ICD-10-CM | POA: Diagnosis not present

## 2015-03-03 DIAGNOSIS — I959 Hypotension, unspecified: Secondary | ICD-10-CM | POA: Diagnosis present

## 2015-03-03 DIAGNOSIS — R6520 Severe sepsis without septic shock: Secondary | ICD-10-CM | POA: Diagnosis present

## 2015-03-03 DIAGNOSIS — Z515 Encounter for palliative care: Secondary | ICD-10-CM | POA: Insufficient documentation

## 2015-03-03 DIAGNOSIS — E876 Hypokalemia: Secondary | ICD-10-CM | POA: Diagnosis present

## 2015-03-03 DIAGNOSIS — Y95 Nosocomial condition: Secondary | ICD-10-CM | POA: Diagnosis present

## 2015-03-03 DIAGNOSIS — J439 Emphysema, unspecified: Secondary | ICD-10-CM | POA: Diagnosis present

## 2015-03-03 DIAGNOSIS — J432 Centrilobular emphysema: Secondary | ICD-10-CM | POA: Diagnosis not present

## 2015-03-03 DIAGNOSIS — J189 Pneumonia, unspecified organism: Secondary | ICD-10-CM | POA: Diagnosis present

## 2015-03-03 DIAGNOSIS — F329 Major depressive disorder, single episode, unspecified: Secondary | ICD-10-CM | POA: Diagnosis present

## 2015-03-03 DIAGNOSIS — Z9981 Dependence on supplemental oxygen: Secondary | ICD-10-CM

## 2015-03-03 DIAGNOSIS — F418 Other specified anxiety disorders: Secondary | ICD-10-CM | POA: Diagnosis not present

## 2015-03-03 DIAGNOSIS — F1010 Alcohol abuse, uncomplicated: Secondary | ICD-10-CM | POA: Diagnosis present

## 2015-03-03 DIAGNOSIS — Z7951 Long term (current) use of inhaled steroids: Secondary | ICD-10-CM

## 2015-03-03 DIAGNOSIS — E860 Dehydration: Secondary | ICD-10-CM | POA: Diagnosis present

## 2015-03-03 DIAGNOSIS — J9600 Acute respiratory failure, unspecified whether with hypoxia or hypercapnia: Secondary | ICD-10-CM | POA: Diagnosis present

## 2015-03-03 DIAGNOSIS — J9601 Acute respiratory failure with hypoxia: Secondary | ICD-10-CM | POA: Diagnosis present

## 2015-03-03 DIAGNOSIS — E43 Unspecified severe protein-calorie malnutrition: Secondary | ICD-10-CM | POA: Diagnosis present

## 2015-03-03 DIAGNOSIS — F17210 Nicotine dependence, cigarettes, uncomplicated: Secondary | ICD-10-CM | POA: Diagnosis present

## 2015-03-03 DIAGNOSIS — Z79899 Other long term (current) drug therapy: Secondary | ICD-10-CM | POA: Diagnosis not present

## 2015-03-03 DIAGNOSIS — Z681 Body mass index (BMI) 19 or less, adult: Secondary | ICD-10-CM | POA: Diagnosis not present

## 2015-03-03 DIAGNOSIS — F064 Anxiety disorder due to known physiological condition: Secondary | ICD-10-CM | POA: Diagnosis present

## 2015-03-03 DIAGNOSIS — F419 Anxiety disorder, unspecified: Secondary | ICD-10-CM | POA: Diagnosis present

## 2015-03-03 DIAGNOSIS — Z7982 Long term (current) use of aspirin: Secondary | ICD-10-CM | POA: Diagnosis not present

## 2015-03-03 DIAGNOSIS — A419 Sepsis, unspecified organism: Principal | ICD-10-CM | POA: Diagnosis present

## 2015-03-03 DIAGNOSIS — I10 Essential (primary) hypertension: Secondary | ICD-10-CM | POA: Diagnosis present

## 2015-03-03 DIAGNOSIS — R109 Unspecified abdominal pain: Secondary | ICD-10-CM | POA: Diagnosis present

## 2015-03-03 DIAGNOSIS — J9602 Acute respiratory failure with hypercapnia: Secondary | ICD-10-CM | POA: Diagnosis present

## 2015-03-03 LAB — I-STAT ARTERIAL BLOOD GAS, ED
Acid-Base Excess: 9 mmol/L — ABNORMAL HIGH (ref 0.0–2.0)
Bicarbonate: 34.8 mEq/L — ABNORMAL HIGH (ref 20.0–24.0)
O2 Saturation: 97 %
TCO2: 36 mmol/L (ref 0–100)
pCO2 arterial: 54.7 mmHg — ABNORMAL HIGH (ref 35.0–45.0)
pH, Arterial: 7.411 (ref 7.350–7.450)
pO2, Arterial: 88 mmHg (ref 80.0–100.0)

## 2015-03-03 LAB — CBC WITH DIFFERENTIAL/PLATELET
Basophils Absolute: 0 10*3/uL (ref 0.0–0.1)
Basophils Relative: 0 % (ref 0–1)
Eosinophils Absolute: 0 10*3/uL (ref 0.0–0.7)
Eosinophils Relative: 0 % (ref 0–5)
HCT: 37.2 % — ABNORMAL LOW (ref 39.0–52.0)
Hemoglobin: 12.6 g/dL — ABNORMAL LOW (ref 13.0–17.0)
LYMPHS ABS: 0.8 10*3/uL (ref 0.7–4.0)
Lymphocytes Relative: 4 % — ABNORMAL LOW (ref 12–46)
MCH: 31.2 pg (ref 26.0–34.0)
MCHC: 33.9 g/dL (ref 30.0–36.0)
MCV: 92.1 fL (ref 78.0–100.0)
MONO ABS: 2.2 10*3/uL — AB (ref 0.1–1.0)
MONOS PCT: 10 % (ref 3–12)
NEUTROS PCT: 86 % — AB (ref 43–77)
Neutro Abs: 18.4 10*3/uL — ABNORMAL HIGH (ref 1.7–7.7)
Platelets: 236 10*3/uL (ref 150–400)
RBC: 4.04 MIL/uL — AB (ref 4.22–5.81)
RDW: 14.3 % (ref 11.5–15.5)
WBC: 21.4 10*3/uL — ABNORMAL HIGH (ref 4.0–10.5)

## 2015-03-03 LAB — COMPREHENSIVE METABOLIC PANEL
ALBUMIN: 2.8 g/dL — AB (ref 3.5–5.0)
ALT: 11 U/L — ABNORMAL LOW (ref 17–63)
AST: 15 U/L (ref 15–41)
Alkaline Phosphatase: 73 U/L (ref 38–126)
Anion gap: 11 (ref 5–15)
BILIRUBIN TOTAL: 1.8 mg/dL — AB (ref 0.3–1.2)
BUN: 18 mg/dL (ref 6–20)
CALCIUM: 8.8 mg/dL — AB (ref 8.9–10.3)
CHLORIDE: 88 mmol/L — AB (ref 101–111)
CO2: 38 mmol/L — ABNORMAL HIGH (ref 22–32)
Creatinine, Ser: 0.73 mg/dL (ref 0.61–1.24)
GFR calc Af Amer: >60 mL/min (ref >60)
GFR calc non Af Amer: >60 mL/min (ref >60)
GLUCOSE: 147 mg/dL — AB (ref 65–99)
Potassium: 2.8 mmol/L — ABNORMAL LOW (ref 3.5–5.1)
Sodium: 137 mmol/L (ref 135–145)
TOTAL PROTEIN: 5.9 g/dL — AB (ref 6.5–8.1)

## 2015-03-03 LAB — I-STAT TROPONIN, ED: Troponin i, poc: 0 ng/mL (ref 0.00–0.08)

## 2015-03-03 LAB — BRAIN NATRIURETIC PEPTIDE: B NATRIURETIC PEPTIDE 5: 151 pg/mL — AB (ref 0.0–100.0)

## 2015-03-03 LAB — I-STAT CG4 LACTIC ACID, ED: Lactic Acid, Venous: 1.19 mmol/L (ref 0.5–2.0)

## 2015-03-03 MED ORDER — SODIUM CHLORIDE 0.9 % IV BOLUS (SEPSIS)
500.0000 mL | Freq: Once | INTRAVENOUS | Status: AC
  Administered 2015-03-03: 500 mL via INTRAVENOUS

## 2015-03-03 MED ORDER — SODIUM CHLORIDE 0.9 % IV BOLUS (SEPSIS)
1000.0000 mL | INTRAVENOUS | Status: AC
  Administered 2015-03-03: 1000 mL via INTRAVENOUS

## 2015-03-03 MED ORDER — VANCOMYCIN HCL IN DEXTROSE 1-5 GM/200ML-% IV SOLN
1000.0000 mg | Freq: Once | INTRAVENOUS | Status: AC
Start: 2015-03-03 — End: 2015-03-03
  Administered 2015-03-03: 1000 mg via INTRAVENOUS
  Filled 2015-03-03: qty 200

## 2015-03-03 MED ORDER — MAGNESIUM SULFATE 2 GM/50ML IV SOLN
2.0000 g | Freq: Once | INTRAVENOUS | Status: AC
  Administered 2015-03-03: 2 g via INTRAVENOUS
  Filled 2015-03-03: qty 50

## 2015-03-03 MED ORDER — VANCOMYCIN HCL IN DEXTROSE 1-5 GM/200ML-% IV SOLN
1000.0000 mg | INTRAVENOUS | Status: DC
  Administered 2015-03-05 – 2015-03-06 (×3): 1000 mg via INTRAVENOUS
  Filled 2015-03-03 (×4): qty 200

## 2015-03-03 MED ORDER — PIPERACILLIN-TAZOBACTAM 3.375 G IVPB 30 MIN
3.3750 g | Freq: Three times a day (TID) | INTRAVENOUS | Status: DC
  Administered 2015-03-03: 3.375 g via INTRAVENOUS
  Filled 2015-03-03 (×2): qty 50

## 2015-03-03 MED ORDER — ALBUTEROL (5 MG/ML) CONTINUOUS INHALATION SOLN
10.0000 mg/h | INHALATION_SOLUTION | RESPIRATORY_TRACT | Status: DC
  Administered 2015-03-03: 10 mg/h via RESPIRATORY_TRACT
  Filled 2015-03-03: qty 20

## 2015-03-03 NOTE — ED Provider Notes (Signed)
CSN: [id number]     Arrival date & time 02/15/2015  2050 History   First MD Initiated Contact with Patient 02/22/2015 2053     Chief Complaint  Patient presents with  . Shortness of Breath     (Consider location/radiation/quality/duration/timing/severity/associated sxs/prior Treatment) Patient is a 69 y.o. male presenting with shortness of breath. The history is provided by the patient.  Shortness of Breath Severity:  Severe Onset quality:  Sudden Duration:  4 hours Timing:  Constant Progression:  Unchanged Chronicity:  New Context comment:  Copd Relieved by:  Nothing Worsened by:  Nothing tried Ineffective treatments:  None tried Associated symptoms: no abdominal pain, no cough, no fever, no headaches, no neck pain and no vomiting     Past Medical History  Diagnosis Date  . Hypertension   . Low back pain syndrome     Joint Pain  . Emphysema   . S/P radiation therapy 07/23/10 - 09/14/10    Right Thigh for Leiomyosarcoma / 61.2 Gy / 34 Fractions  . Respiratory failure, acute 3/276/15 -11/04/13     Hospitalized  . Depression   . COPD (chronic obstructive pulmonary disease)   . 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 Pneumonia 1979  . Headache     "1-2/wk" (01/16/2015)  . Arthritis     "hands" (01/16/2015)  . Chronic back pain   . Anxiety   . On home oxygen therapy     "3L; 24/7" (02/11/2015)   Past Surgical History  Procedure Laterality Date  . Elbow fracture surgery Left ~ 1964  . Leg skin lesion  biopsy / excision Right 04/20/10; 06/18/10    leiomyosarcoma; inner thigh   . Back surgery  X 4  . Elbow surgery Left   . Left heart catheterization with coronary angiogram N/A 11/02/2013    Procedure: LEFT HEART CATHETERIZATION WITH CORONARY ANGIOGRAM;  Surgeon: Troy Sine, MD;  Location: [facility] CATH LAB;  Service: Cardiovascular;  Laterality: N/A;  . Cardiac catheterization    . Lymph node dissection Right 06/2010    groin/notes 06/19/2010  . Repair dural / csf leak   01/06/2007; 01/19/2007    lumbar/notes 12/09/2010  . Lumbar laminectomy/decompression microdiscectomy  ~ 11/2006; 01/19/2007    Archie Endo 12/09/2010  . Colonoscopy w/ biopsies and polypectomy     Family History  Problem Relation Age of Onset  . Heart disease Mother     heart attack   History  Substance Use Topics  . Smoking status: Former Smoker -- 0.00 packs/day for 51 years    Types: Cigarettes    Quit date: 08/09/2014  . Smokeless tobacco: Never Used  . Alcohol Use: Yes     Comment: 02/11/2015 "stopped drinking in Jan"     Review of Systems  Constitutional: Negative for fever.  HENT: Negative for drooling and rhinorrhea.   Eyes: Negative for pain.  Respiratory: Positive for chest tightness and shortness of breath. Negative for cough.   Cardiovascular: Negative for leg swelling.  Gastrointestinal: Negative for nausea, vomiting, abdominal pain and diarrhea.  Genitourinary: Negative for dysuria and hematuria.  Musculoskeletal: Negative for gait problem and neck pain.  Skin: Negative for color change.  Neurological: Negative for numbness and headaches.  Hematological: Negative for adenopathy.  Psychiatric/Behavioral: Negative for behavioral problems.  All other systems reviewed and are negative.     Allergies  Review of patient's allergies indicates no known allergies.  Home Medications   Prior to Admission medications   Medication Sig  Start Date End Date Taking? Authorizing Provider  albuterol (PROAIR HFA) 108 (90 BASE) MCG/ACT inhaler INHALE 2 PUFFS BY MOUTH EVERY 4 TO 6 HOURS AS NEEDED 02/03/15  Yes Mariel Aloe, MD  ALPRAZolam Duanne Moron) 0.5 MG tablet Take 0.5 mg by mouth 2 (two) times daily as needed. 02/14/15  Yes Historical Provider, MD  aspirin 81 MG tablet Take 1 tablet (81 mg total) by mouth daily. 12/21/13  Yes Mariel Aloe, MD  budesonide (PULMICORT) 0.5 MG/2ML nebulizer solution Take 2 mLs (0.5 mg total) by nebulization 2 (two) times daily. DX J43.2 02/19/15  Yes Juanito Doom, MD  busPIRone (BUSPAR) 10 MG tablet Take 1 tablet (10 mg total) by mouth 2 (two) times daily. 10/11/14  Yes Mariel Aloe, MD  clonazePAM (KLONOPIN) 0.5 MG tablet Take 0.5 tablets (0.25 mg total) by mouth 2 (two) times daily. 02/24/15  Yes Mariel Aloe, MD  feeding supplement (BOOST HIGH PROTEIN) LIQD Take 237 mLs by mouth 2 (two) times daily between meals. 02/04/15  Yes Juanito Doom, MD  ipratropium-albuterol (DUONEB) 0.5-2.5 (3) MG/3ML SOLN INHALE 1 VIAL VIA NEBULIZER FOUR TIMES DAILY, CAN USE AN ADDITIONAL 2 TREATMENTS AS NEEDED 02/11/15  Yes Tanda Rockers, MD  irbesartan (AVAPRO) 75 MG tablet TAKE 1 TABLET BY MOUTH DAILY 02/18/15  Yes Tanda Rockers, MD  lisinopril-hydrochlorothiazide (PRINZIDE,ZESTORETIC) 20-25 MG per tablet Take 1 tablet by mouth daily. 02/25/15  Yes Historical Provider, MD  sertraline (ZOLOFT) 50 MG tablet TAKE 1 TABLET BY MOUTH DAILY 01/31/15  Yes Mariel Aloe, MD  guaiFENesin (MUCINEX) 600 MG 12 hr tablet Take 1 tablet (600 mg total) by mouth 2 (two) times daily as needed. Patient taking differently: Take 600 mg by mouth 2 (two) times daily as needed for cough or to loosen phlegm.  12/21/13   Mariel Aloe, MD  polyethylene glycol (MIRALAX / GLYCOLAX) packet Take 17 g by mouth daily. 02/20/15   Mariel Aloe, MD   There were no vitals taken for this visit. Physical Exam  Constitutional: He appears well-developed and well-nourished. He appears distressed.  thin  HENT:  Head: Normocephalic and atraumatic.  Right Ear: External ear normal.  Left Ear: External ear normal.  Nose: Nose normal.  Mouth/Throat: Oropharynx is clear and moist. No oropharyngeal exudate.  Eyes: Conjunctivae and EOM are normal. Pupils are equal, round, and reactive to light.  Neck: Normal range of motion. Neck supple.  Cardiovascular: Regular rhythm, normal heart sounds and intact distal pulses.  Exam reveals no gallop and no friction rub.   No murmur heard. Pulmonary/Chest: He is in  respiratory distress. He has wheezes.  Tachypneic, speaking only in short words.  Diminished breath sounds diffusely with mild exp wheeze.  Abdominal: Soft. Bowel sounds are normal. He exhibits no distension. There is no tenderness. There is no rebound and no guarding.  Musculoskeletal: Normal range of motion. He exhibits no edema or tenderness.  Neurological: He is alert.  Skin: Skin is warm and dry.  Psychiatric: He has a normal mood and affect. His behavior is normal.  Nursing note and vitals reviewed.   ED Course  Procedures (including critical care time) Labs Review Labs Reviewed  CBC WITH DIFFERENTIAL/PLATELET - Abnormal; Notable for the following:    WBC 21.4 (*)    RBC 4.04 (*)    Hemoglobin 12.6 (*)    HCT 37.2 (*)    Neutrophils Relative % 86 (*)    Neutro Abs 18.4 (*)  Lymphocytes Relative 4 (*)    Monocytes Absolute 2.2 (*)    All other components within normal limits  COMPREHENSIVE METABOLIC PANEL - Abnormal; Notable for the following:    Potassium 2.8 (*)    Chloride 88 (*)    CO2 38 (*)    Glucose, Bld 147 (*)    Calcium 8.8 (*)    Total Protein 5.9 (*)    Albumin 2.8 (*)    ALT 11 (*)    Total Bilirubin 1.8 (*)    All other components within normal limits  BRAIN NATRIURETIC PEPTIDE - Abnormal; Notable for the following:    B Natriuretic Peptide 151.0 (*)    All other components within normal limits  I-STAT ARTERIAL BLOOD GAS, ED - Abnormal; Notable for the following:    pCO2 arterial 54.7 (*)    Bicarbonate 34.8 (*)    Acid-Base Excess 9.0 (*)    All other components within normal limits  CULTURE, BLOOD (ROUTINE X 2)  CULTURE, BLOOD (ROUTINE X 2)  URINE CULTURE  URINALYSIS, ROUTINE W REFLEX MICROSCOPIC (NOT AT [facility])  Randolm Idol, ED  I-STAT CG4 LACTIC ACID, ED    Imaging Review Dg Chest Port 1 View  03/02/2015   CLINICAL DATA:  Initial evaluation for acute shortness of breath.  EXAM: PORTABLE CHEST - 1 VIEW  COMPARISON:  Prior study from  02/10/2015.  FINDINGS: Cardiac and mediastinal silhouettes are stable in size and contour, and remain within normal limits.  Lungs are hyperinflated with attenuation of the pulmonary markings, consistent with underlying emphysema. There is parenchymal infiltrate within the right upper lobe, consistent with pneumonia. Additional less severe infiltrate within the left upper lobe as well. No definite infiltrates within the lower lung fields. No pulmonary edema or pleural effusion. No pneumothorax.  No acute osseus abnormality.  IMPRESSION: 1. Multi focal parenchymal infiltrates within the bilateral upper lobes, right worse than left, consistent with multifocal pneumonia. 2. Emphysema.   Electronically Signed   By: Jeannine Boga M.D.   On: 03/07/2015 21:54     EKG Interpretation   Date/Time:  Monday March 03 2015 21:16:37 EDT Ventricular Rate:  129 PR Interval:  124 QRS Duration: 153 QT Interval:  332 QTC Calculation: 486 R Axis:   -82 Text Interpretation:  Age not entered, assumed to be  69 years old for  purpose of ECG interpretation Sinus tachycardia Multiform ventricular  premature complexes RBBB and LAFB Abnormal lateral Q waves Confirmed by  Zarea Diesing  MD, Zoejane Gaulin ([id number]) on 02/22/2015 11:51:20 PM     CRITICAL CARE Performed by: Pamella Pert, S Total critical care time: 30 min Critical care time was exclusive of separately billable procedures and treating other patients. Critical care was necessary to treat or prevent imminent or life-threatening deterioration. Critical care was time spent personally by me on the following activities: development of treatment plan with patient and/or surrogate as well as nursing, discussions with consultants, evaluation of patient's response to treatment, examination of patient, obtaining history from patient or surrogate, ordering and performing treatments and interventions, ordering and review of laboratory studies, ordering and review of  radiographic studies, pulse oximetry and re-evaluation of patient's condition.  MDM   Final diagnoses:  SOB (shortness of breath)  COPD exacerbation  HCAP (healthcare-associated pneumonia)  Acute respiratory failure with hypoxia    9:07 PM 69 y.o. male w hx of HTN, COPD on home oxygen who presents with shortness of breath which began 3-4 hours ago which has progressively worsened. The  patient has similar presentations in the past for hypercarbia and respiratory failure. Last time he was here he was placed on BiPAP and admitted to the hospital. He is visibly short of breath on exam speaking in few words with decreased breath sounds and mild expiratory wheeze heard diffusely. He got 125 mg Solu-Medrol in route. We'll place on continuous albuterol treatment, bipap, start magnesium, get ABG, and get labs and imaging.   Pt improving. Found to have leukocytosis and chest x-ray concerning for multifocal pneumonia. Will cover for HCAP. Borderline BP's w/ sys in 90's. Pt will get 1.5 L IVF. Normal lactic acid. Family medicine to admit to stepdown.   Pamella Pert, MD 02/23/2015 2352

## 2015-03-03 NOTE — ED Notes (Addendum)
Pt. reports worsening SOB with productive cough , wheezing , rales/crackles unrelieved by nebulizer treat ment at home ,. Denies fever or chills, heavy smoker /COPD . Pt. Received IV Solumedrol 125 mg and 3 nebulizer treat ment b y EMS ( Albuterol 5 mg . 2 Duoneb)

## 2015-03-03 NOTE — Consult Note (Signed)
ANTIBIOTIC CONSULT NOTE - INITIAL  Pharmacy Consult for Vancomycin and Zosyn Indication: HCAP  No Known Allergies  Patient Measurements: Weight 43.1kg Height 5'7''  Vital Signs: Temp: 97.4 F (36.3 C) (07/25 2132) Temp Source: Axillary (07/25 2132) BP: 88/49 mmHg (07/25 2200) Pulse Rate: 101 (07/25 2200) Intake/Output from previous day:   Intake/Output from this shift:    Labs:  Recent Labs  02/15/2015 2114  WBC 21.4*  HGB 12.6*  PLT 236  CREATININE 0.73   Estimated Creatinine Clearance: 54.6 mL/min (by C-G formula based on Cr of 0.73).  Microbiology: Recent Results (from the past 720 hour(s))  Blood culture (routine x 2)     Status: None   Collection Time: 02/10/15 11:17 PM  Result Value Ref Range Status   Specimen Description LEFT ANTECUBITAL  Final   Special Requests BOTTLES DRAWN AEROBIC AND ANAEROBIC 10CC  Final   Culture NO GROWTH 5 DAYS  Final   Report Status 02/15/2015 FINAL  Final  Blood culture (routine x 2)     Status: None   Collection Time: 02/10/15 11:24 PM  Result Value Ref Range Status   Specimen Description BLOOD LEFT HAND  Final   Special Requests BOTTLES DRAWN AEROBIC ONLY 10CC  Final   Culture NO GROWTH 5 DAYS  Final   Report Status 02/15/2015 FINAL  Final  MRSA PCR Screening     Status: None   Collection Time: 02/11/15  1:44 AM  Result Value Ref Range Status   MRSA by PCR NEGATIVE NEGATIVE Final    Comment:        The GeneXpert MRSA Assay (FDA approved for NASAL specimens only), is one component of a comprehensive MRSA colonization surveillance program. It is not intended to diagnose MRSA infection nor to guide or monitor treatment for MRSA infections.     Medical History: Past Medical History  Diagnosis Date  . Hypertension   . Low back pain syndrome     Joint Pain  . Emphysema   . S/P radiation therapy 07/23/10 - 09/14/10    Right Thigh for Leiomyosarcoma / 61.2 Gy / 34 Fractions  . Respiratory failure, acute 3/276/15  -11/04/13     Hospitalized  . Depression   . COPD (chronic obstructive pulmonary disease)   . 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 Pneumonia 1979  . Headache     "1-2/wk" (01/16/2015)  . Arthritis     "hands" (01/16/2015)  . Chronic back pain   . Anxiety   . On home oxygen therapy     "3L; 24/7" (02/11/2015)   Assessment: 68yom recently discharged from [facility] (02/15/15) after admission for COPD exacerbation returns to the ED with worsening SOB and productive cough. CXR shows multifocal bilateral parenchymal infiltrates consistent with multifocal pneumonia. He will begin vancomycin and zosyn. Renal function wnl.  Goal of Therapy:  Vancomycin trough level 15-20 mcg/ml  Plan:  1) Vancomycin 1g IV q24 2) Zosyn 3.375g IV q8 (4 hour infusion) 3) Follow renal function, cultures, LOT, level if needed  Deboraha Sprang 03/02/2015,10:26 PM

## 2015-03-03 NOTE — Progress Notes (Signed)
RT called to take patient off of BIPAP. Placed patient on [facility] which is home regimen. Patient tolerating well at this time.

## 2015-03-03 NOTE — Patient Outreach (Signed)
[facility]) Care Management  02/11/2015  KERT SHACKETT 1945/12/11 [id number]   Request from Erenest Rasher, RN to assign SW, assigned Eula Fried, LCSW.  [name]. [place], [place] Management [place] Assistant Phone: [phone] Fax: [phone]

## 2015-03-03 NOTE — Patient Outreach (Signed)
Present to the home of Hector House today to follow up with the patient about his medication adherence.  Patient reports that he is running low on his budesonide nebulizer solution. Call [place] at [phone] with Hector House to reorder this. The representative states that it will be delivered this week by Thursday.  Patient is currently short of breath and reports that he has not yet used the albuterol-ipratropium. Review importance of using this four times daily. Patient uses 1 vial via nebulizer now.  Provide Hector House with a chart of his oral medications and what each is for. Create a chart of Hector House nebulizer medications with him, taping a sample container of each to the paper. Patient states that this is the easiest way for him to recognize these medications. Extensively review this chart with the patient and then post it to the wall next to his bed. Patient is unable to locate my business card; provide patient with a new card. Patient asks me to call Hector House to review the dosing information with her as well. However, Hector House is not home and does not answer ([phone]). Ask patient to show Hector House our charts when she comes by later today and to show her my card to call me with any questions.  Patient reports that Hector House has been by this morning and that she is giving him his oral medications. Reports that he is not taking these himself, except for the single   tablet dose of clonazepam that Hector House leaves out for him to take when he needs it twice a day. Patient takes a dose of clonazepam during our visit as he states that he is getting anxious trying to remember how to take his medications.  Schedule an appointment to come and meet with Hector House again next Monday, 03/10/15. Remind patient to call me with any questions that he has before then.  [name], PharmD Clinical Pharmacist [place] Management [phone]

## 2015-03-03 NOTE — H&P (Signed)
[facility] Admission History and Physical Service Pager: [phone]  Patient name: Hector House Medical record number: [id number] Date of birth: [birth year] Age: 69 y.o. Gender: male  Primary Care Provider: [name], MD Consultants: none Code Status: DNR (per discussion on admission)  Chief Complaint: shortness of breath  Assessment and Plan: Hector House is a 69 y.o. male presenting with shortness of breath . PMH is significant for COPD, HTN, and malnutrition.   Acute Respiratory Failure and severe sepsis 2/2 HCAP, also with possible COPD exacerbation: Acute worsening of dyspnea not related to exertion with increased O2 requirement. Admits to productive cough, fever/chills, and left sided chest pain. Meets SIRS criteria on admission with tachypnea, tachycardia, and elevated WBC (21).  BP hypotensive to 25D systolic on admission as well. S/p CAT and BiPAP in ED, now on 4L [place] but continuing to be in mild respiratory distress with accessory muscle use.  CXR in ED revealed multifocal pneumonia. Initially wheezing per report, now s/p duonebs, solumedrol, and BiPAP, so will treat empirically for COPD exacerbation as well. Will need to rule out cardiac causes of chest pain/SOB. BNP 151 although BNP has been in that range previously. Last Echo in 10/2013 with EF 50-55% and normal diastolic function. No evidence of volume overload on exam with dry MM and no crackles or edema and hypotension on exam.  EKG revealed sinus tachycardia with PVCs (nonischemic) and initial troponin negative. ABG with normal pH and mild hypercapnea. - Admit to SDU, [place], attending [place] for close monitoring of respiratory status and BP - Continue monitoring respiratory status and O2 requirement - low threshold to call CCM if not protecting airway or not improving on BiPAP - O2 via [place]/mask or BiPAP prn - Continue Vanc/Zosyn for treatment of HCAP - Continue home Pulmicort neb BID - Duoneb q4  scheduled with Duoneb q2 PRN - Gentle IVF 85mL/hr for 12 hours, can re-bolus prn - Prednisone 50mg  for 4 more days starting tomorrow morning - Cycle Troponins - Repeat EKG in AM - Consider consulting pulm in the AM - as patient is followed by [facility] as [facility]  Hypotension with h/o hypertension: on ARB-HCTZ at home.Currently hypotensive in setting of sepsis. -Hold home irbesartan, Lisinopril, and HCTZ -Monitor BP -IVF as above, can re-bolus prn to maintain sBP >90  Hypokalemia: K 2.8, Likely secondary to Albuterol treatments -Replete with IV 22meq/100mL q1h x4 and 1 dose [id number] - Continue to monitor  UA with granular casts, protein, urobilinogen, ketones: C/w dehydration in setting of sepsis.  - IVF as above - f/u UA - sent by ED  Anxiety: Mood stable  -Hold home Xanax for now as patient is taking 2 different benzos at home -Monitor for sx of anxiety - Continue home Zoloft 50 mg qd and Klonopin BID - Continue home buspar  Protein-calorie malnutrition, severe: chronic - Continue home Boost high protein oral nutrition supplements - Nutrition consulted  Tobacco Abuse - Chronic, 2 packs/day for 50 years - Admits to smoking 5 cigarettes/day for the past 2 weeks. - tobacco cessation counseling - nicotine patch upon request  FEN/GI: NS @75mL /hr x12h, heart healthy diet Prophylaxis: sub q heparin  Disposition: Admit to step down unit, FPTS, attending Mingo Amber, PT/OT consulted  History of Present Illness: Hector House is a 69 y.o. male presenting with shortness of breath. He came here about 2 weeks ago for a similar complaint. He started feeling short of breath around 3pm yesterday. He was sitting on the couch  at the time. He is on 3L of O2 at home and is compliant with medications. Admits to feelings feverish and a "hot head" and chills. Admits to productive cough with gray/brown mucus but it is a chronic issue. .Admits to chest pain on his left side intermittently for about 6-8 months.  He states it feels like something is stinging him. Denies swelling in his legs. He sleeps "propped up" on hospital bed or else he can't breathe at night - no recent change in incline.  Patient was transported to hospital via EMS who gave him Solumedrol. In ED he was tachypnic so he received continuous Albuterol and was placed on BiPAP. His BP was on low so he was given a 1.5L bolus of NS. He was also given Mg. He had an CXR that revealed multifocal pneumonia so he was started on Van/Zosyn.   Review Of Systems: Per HPI with the following additions: no dysuria Otherwise 12 point review of systems was performed and was unremarkable.  Patient Active Problem List   Diagnosis Date Noted  . HCAP (healthcare-associated pneumonia) 03/09/2015  . Constipation 02/25/2015  . COPD exacerbation 02/11/2015  . Pressure ulcer 02/11/2015  . Acute respiratory failure with hypercapnia   . Underweight 06/24/2014  . Protein-calorie malnutrition, severe 11/02/2013  . Anxiety disorder due to medical condition 11/01/2013  . 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 History of sarcoma of soft tissue 09/20/2011  . 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 Alcohol use 10/22/2009  . HYPERTENSION, BENIGN ESSENTIAL 09/25/2009  . COPD (chronic obstructive pulmonary disease) with emphysema 09/25/2009  . LOW BACK PAIN SYNDROME 09/25/2009   Past Medical History: Past Medical History  Diagnosis Date  . Hypertension   . Low back pain syndrome     Joint Pain  . Emphysema   . S/P radiation therapy 07/23/10 - 09/14/10    Right Thigh for Leiomyosarcoma / 61.2 Gy / 34 Fractions  . Respiratory failure, acute 3/276/15 -11/04/13     Hospitalized  . Depression   . COPD (chronic obstructive pulmonary disease)   . 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 Pneumonia 1979  . Headache     "1-2/wk" (01/16/2015)  . Arthritis     "hands" (01/16/2015)  . Chronic back pain   . Anxiety   . On home oxygen therapy     "3L; 24/7"  (02/11/2015)   Past Surgical History: Past Surgical History  Procedure Laterality Date  . Elbow fracture surgery Left ~ 1964  . Leg skin lesion  biopsy / excision Right 04/20/10; 06/18/10    leiomyosarcoma; inner thigh   . Back surgery  X 4  . Elbow surgery Left   . Left heart catheterization with coronary angiogram N/A 11/02/2013    Procedure: LEFT HEART CATHETERIZATION WITH CORONARY ANGIOGRAM;  Surgeon: Troy Sine, MD;  Location: [facility] CATH LAB;  Service: Cardiovascular;  Laterality: N/A;  . Cardiac catheterization    . Lymph node dissection Right 06/2010    groin/notes 06/19/2010  . Repair dural / csf leak  01/06/2007; 01/19/2007    lumbar/notes 12/09/2010  . Lumbar laminectomy/decompression microdiscectomy  ~ 11/2006; 01/19/2007    Archie Endo 12/09/2010  . Colonoscopy w/ biopsies and polypectomy     Social History: History  Substance Use Topics  . Smoking status: Former Smoker -- 0.00 packs/day for 51 years    Types: Cigarettes    Quit date: 08/09/2014  . Smokeless tobacco: Never Used  . Alcohol Use: Yes  Comment: 02/11/2015 "stopped drinking in Jan"    Additional social history: He smokes about 5 cigarettes a day but he was smoking 2 packs/day for 50 years. Denies alcohol use for the last 7 months. Denies illicit drugs  Please also refer to relevant sections of EMR.  Family History: Family History  Problem Relation Age of Onset  . Heart disease Mother     heart attack   Allergies and Medications: No Known Allergies No current facility-administered medications on file prior to encounter.   Current Outpatient Prescriptions on File Prior to Encounter  Medication Sig Dispense Refill  . albuterol (PROAIR HFA) 108 (90 BASE) MCG/ACT inhaler INHALE 2 PUFFS BY MOUTH EVERY 4 TO 6 HOURS AS NEEDED 18 g 2  . aspirin 81 MG tablet Take 1 tablet (81 mg total) by mouth daily. 30 tablet 5  . budesonide (PULMICORT) 0.5 MG/2ML nebulizer solution Take 2 mLs (0.5 mg total) by nebulization 2 (two)  times daily. DX J43.2 120 mL 5  . busPIRone (BUSPAR) 10 MG tablet Take 1 tablet (10 mg total) by mouth 2 (two) times daily. 60 tablet 5  . clonazePAM (KLONOPIN) 0.5 MG tablet Take 0.5 tablets (0.25 mg total) by mouth 2 (two) times daily. 15 tablet 2  . feeding supplement (BOOST HIGH PROTEIN) LIQD Take 237 mLs by mouth 2 (two) times daily between meals. 60 Can 11  . ipratropium-albuterol (DUONEB) 0.5-2.5 (3) MG/3ML SOLN INHALE 1 VIAL VIA NEBULIZER FOUR TIMES DAILY, CAN USE AN ADDITIONAL 2 TREATMENTS AS NEEDED 1620 mL 11  . irbesartan (AVAPRO) 75 MG tablet TAKE 1 TABLET BY MOUTH DAILY 90 tablet 1  . sertraline (ZOLOFT) 50 MG tablet TAKE 1 TABLET BY MOUTH DAILY 90 tablet 2  . guaiFENesin (MUCINEX) 600 MG 12 hr tablet Take 1 tablet (600 mg total) by mouth 2 (two) times daily as needed. (Patient taking differently: Take 600 mg by mouth 2 (two) times daily as needed for cough or to loosen phlegm. ) 20 tablet 2  . polyethylene glycol (MIRALAX / GLYCOLAX) packet Take 17 g by mouth daily. 14 each 0    Objective: BP 96/54 mmHg  Pulse 92  Temp(Src) 97.4 F (36.3 C) (Axillary)  Resp 18  Ht 5\' 8"  (1.727 m)  Wt 108 lb (48.988 kg)  BMI 16.42 kg/m2  SpO2 100% Exam: General: Cachectic male sitting up in bed, AO x3  Eyes: PERRLA, EOMI ENTM: NCAT, dry MM, no teeth, [place] in place Neck: accessory muscles being used to breathe Cardiovascular: RRR, No rubs, gallops or murmurs. Normal s1 and s2. Intact distal pulses Respiratory: increased work of breathing, using accessory muscles with supraclavicular retractions without subcostal retractions. Crackles and wheezing in bilateral lung fields. Rhonchorus breath sounds in RUL and RML on my exam, no wheezes. Diminished breath sounds at bases Abdomen: Non tender, soft, normal bowel sounds MSK: Thin/frail with muscular atrophy  Skin: erythremic rash on upper back Neuro: Moves all extremities, no focal deficits, alert and oriented Psych: Normal mood and  affect  Labs and Imaging: CBC BMET   Recent Labs Lab 03/02/2015 2114  WBC 21.4*  HGB 12.6*  HCT 37.2*  PLT 236    Recent Labs Lab 03/09/2015 2114  NA 137  K 2.8*  CL 88*  CO2 38*  BUN 18  CREATININE 0.73  GLUCOSE 147*  CALCIUM 8.8*     Urinalysis    Component Value Date/Time   COLORURINE ORANGE* 03/09/2015 2327   APPEARANCEUR CLEAR 02/14/2015 2327   LABSPEC 1.020  02/08/2015 2327   PHURINE 5.5 02/28/2015 2327   GLUCOSEU NEGATIVE 02/07/2015 2327   HGBUR NEGATIVE 02/12/2015 2327   BILIRUBINUR SMALL* 03/01/2015 2327   KETONESUR 15* 03/09/2015 2327   PROTEINUR 30* 02/19/2015 2327   UROBILINOGEN 4.0* 02/27/2015 2327   NITRITE NEGATIVE 02/14/2015 2327   LEUKOCYTESUR TRACE* 02/27/2015 2327    ABG    Component Value Date/Time   PHART 7.411 02/19/2015 2222   PCO2ART 54.7* 03/07/2015 2222   PO2ART 88.0 02/26/2015 2222   HCO3 34.8* 02/18/2015 2222   TCO2 36 02/24/2015 2222   O2SAT 97.0 03/02/2015 2222    Lactic acid 1.19  Troponin 0.00  BNP (last 3 results)  Recent Labs  01/16/15 1400 02/10/15 2142 02/14/2015 2114  BNP 14.8 141.6* 151.0*    ProBNP (last 3 results)  Recent Labs  06/24/14 0527  PROBNP 244.4*    EKG (7/25): Sinus tachycardia with multiform PVCs, non-ischemic  Dg Chest Port 1 View  03/07/2015   CLINICAL DATA:  Initial evaluation for acute shortness of breath.  EXAM: PORTABLE CHEST - 1 VIEW  COMPARISON:  Prior study from 02/10/2015.  FINDINGS: Cardiac and mediastinal silhouettes are stable in size and contour, and remain within normal limits.  Lungs are hyperinflated with attenuation of the pulmonary markings, consistent with underlying emphysema. There is parenchymal infiltrate within the right upper lobe, consistent with pneumonia. Additional less severe infiltrate within the left upper lobe as well. No definite infiltrates within the lower lung fields. No pulmonary edema or pleural effusion. No pneumothorax.  No acute osseus abnormality.   IMPRESSION: 1. Multi focal parenchymal infiltrates within the bilateral upper lobes, right worse than left, consistent with multifocal pneumonia. 2. Emphysema.   Electronically Signed   By: Jeannine Boga M.D.   On: 03/07/2015 21:54     Carlyle Dolly, MD 02/17/2015, 2:05 AM PGY-1, [place] [facility] pager: [phone], text pages welcome  Upper Level Addendum:  I have seen and evaluated this patient along with Dr. Juanito Doom and reviewed the above note, making necessary revisions in pink.  Virginia Crews, MD, MPH PGY-2,  [facility] 03/04/2015 5:44 AM

## 2015-03-03 NOTE — Patient Outreach (Signed)
Received a call back from patient's primary care physician, Dr. Lonny Prude. Discussed with him the patient's blood pressure medication duplication and about patient's non adherence related to his memory and anxiety.  [name], PharmD Clinical Pharmacist [facility] [phone]

## 2015-03-04 ENCOUNTER — Observation Stay (HOSPITAL_COMMUNITY): Payer: Medicare Other

## 2015-03-04 ENCOUNTER — Encounter: Payer: Self-pay | Admitting: Clinical

## 2015-03-04 ENCOUNTER — Other Ambulatory Visit: Payer: Self-pay | Admitting: Family Medicine

## 2015-03-04 ENCOUNTER — Telehealth: Payer: Self-pay | Admitting: Internal Medicine

## 2015-03-04 DIAGNOSIS — J439 Emphysema, unspecified: Secondary | ICD-10-CM

## 2015-03-04 DIAGNOSIS — J9600 Acute respiratory failure, unspecified whether with hypoxia or hypercapnia: Secondary | ICD-10-CM

## 2015-03-04 DIAGNOSIS — R109 Unspecified abdominal pain: Secondary | ICD-10-CM

## 2015-03-04 DIAGNOSIS — J432 Centrilobular emphysema: Secondary | ICD-10-CM

## 2015-03-04 LAB — CBC
HCT: 30.8 % — ABNORMAL LOW (ref 39.0–52.0)
HEMOGLOBIN: 10.1 g/dL — AB (ref 13.0–17.0)
MCH: 30.7 pg (ref 26.0–34.0)
MCHC: 32.8 g/dL (ref 30.0–36.0)
MCV: 93.6 fL (ref 78.0–100.0)
Platelets: 183 10*3/uL (ref 150–400)
RBC: 3.29 MIL/uL — ABNORMAL LOW (ref 4.22–5.81)
RDW: 14.6 % (ref 11.5–15.5)
WBC: 14 10*3/uL — ABNORMAL HIGH (ref 4.0–10.5)

## 2015-03-04 LAB — BASIC METABOLIC PANEL
ANION GAP: 6 (ref 5–15)
Anion gap: 9 (ref 5–15)
BUN: 11 mg/dL (ref 6–20)
BUN: 13 mg/dL (ref 6–20)
CALCIUM: 7.4 mg/dL — AB (ref 8.9–10.3)
CALCIUM: 7.6 mg/dL — AB (ref 8.9–10.3)
CHLORIDE: 100 mmol/L — AB (ref 101–111)
CO2: 31 mmol/L (ref 22–32)
CO2: 32 mmol/L (ref 22–32)
Chloride: 100 mmol/L — ABNORMAL LOW (ref 101–111)
Creatinine, Ser: 0.56 mg/dL — ABNORMAL LOW (ref 0.61–1.24)
Creatinine, Ser: 0.53 mg/dL — ABNORMAL LOW (ref 0.61–1.24)
GFR calc Af Amer: >60 mL/min (ref >60)
GFR calc Af Amer: >60 mL/min (ref >60)
GFR calc non Af Amer: >60 mL/min (ref >60)
Glucose, Bld: 139 mg/dL — ABNORMAL HIGH (ref 65–99)
Glucose, Bld: 194 mg/dL — ABNORMAL HIGH (ref 65–99)
POTASSIUM: 2.8 mmol/L — AB (ref 3.5–5.1)
Potassium: 2.7 mmol/L — CL (ref 3.5–5.1)
SODIUM: 138 mmol/L (ref 135–145)
Sodium: 140 mmol/L (ref 135–145)

## 2015-03-04 LAB — URINALYSIS, ROUTINE W REFLEX MICROSCOPIC
GLUCOSE, UA: NEGATIVE mg/dL
Hgb urine dipstick: NEGATIVE
Ketones, ur: 15 mg/dL — AB
Nitrite: NEGATIVE
Protein, ur: 30 mg/dL — AB
Specific Gravity, Urine: 1.02 (ref 1.005–1.030)
Urobilinogen, UA: 4 mg/dL — ABNORMAL HIGH (ref 0.0–1.0)
pH: 5.5 (ref 5.0–8.0)

## 2015-03-04 LAB — LACTIC ACID, PLASMA
LACTIC ACID, VENOUS: 1.5 mmol/L (ref 0.5–2.0)
LACTIC ACID, VENOUS: 1.9 mmol/L (ref 0.5–2.0)

## 2015-03-04 LAB — URINE MICROSCOPIC-ADD ON

## 2015-03-04 LAB — TROPONIN I
TROPONIN I: 0.03 ng/mL (ref <0.031)
Troponin I: <0.03 ng/mL (ref <0.031)
Troponin I: 0.03 ng/mL (ref <0.031)

## 2015-03-04 LAB — MAGNESIUM: Magnesium: 1.8 mg/dL (ref 1.7–2.4)

## 2015-03-04 MED ORDER — SODIUM CHLORIDE 0.9 % IV BOLUS (SEPSIS)
500.0000 mL | Freq: Once | INTRAVENOUS | Status: AC
  Administered 2015-03-04: 500 mL via INTRAVENOUS

## 2015-03-04 MED ORDER — LORAZEPAM 2 MG/ML IJ SOLN
1.0000 mg | Freq: Four times a day (QID) | INTRAMUSCULAR | Status: AC | PRN
  Administered 2015-03-05 – 2015-03-07 (×4): 1 mg via INTRAVENOUS
  Filled 2015-03-04 (×4): qty 1

## 2015-03-04 MED ORDER — BOOST HIGH PROTEIN PO LIQD
1.0000 | Freq: Two times a day (BID) | ORAL | Status: DC
  Administered 2015-03-06: 237 mL via ORAL
  Filled 2015-03-04 (×12): qty 237

## 2015-03-04 MED ORDER — IPRATROPIUM-ALBUTEROL 0.5-2.5 (3) MG/3ML IN SOLN
RESPIRATORY_TRACT | Status: AC
  Administered 2015-03-04: 3 mL via RESPIRATORY_TRACT
  Filled 2015-03-04: qty 3

## 2015-03-04 MED ORDER — PIPERACILLIN-TAZOBACTAM 3.375 G IVPB
3.3750 g | Freq: Three times a day (TID) | INTRAVENOUS | Status: DC
  Administered 2015-03-04 – 2015-03-07 (×11): 3.375 g via INTRAVENOUS
  Filled 2015-03-04 (×14): qty 50

## 2015-03-04 MED ORDER — VITAMIN B-1 100 MG PO TABS
100.0000 mg | ORAL_TABLET | Freq: Every day | ORAL | Status: DC
  Administered 2015-03-04 – 2015-03-07 (×3): 100 mg via ORAL
  Filled 2015-03-04 (×4): qty 1

## 2015-03-04 MED ORDER — POTASSIUM CHLORIDE CRYS ER 20 MEQ PO TBCR: 40.0000 meq | EXTENDED_RELEASE_TABLET | Freq: Two times a day (BID) | ORAL | Status: DC

## 2015-03-04 MED ORDER — ADULT MULTIVITAMIN W/MINERALS CH
1.0000 | ORAL_TABLET | Freq: Every day | ORAL | Status: DC
  Administered 2015-03-04 – 2015-03-05 (×2): 1 via ORAL
  Filled 2015-03-04 (×2): qty 1

## 2015-03-04 MED ORDER — LORAZEPAM 1 MG PO TABS
1.0000 mg | ORAL_TABLET | Freq: Four times a day (QID) | ORAL | Status: AC | PRN
  Administered 2015-03-05 (×2): 1 mg via ORAL
  Filled 2015-03-04 (×2): qty 1

## 2015-03-04 MED ORDER — POTASSIUM CHLORIDE 10 MEQ/100ML IV SOLN
10.0000 meq | INTRAVENOUS | Status: AC
  Administered 2015-03-04 (×4): 10 meq via INTRAVENOUS
  Filled 2015-03-04 (×4): qty 100

## 2015-03-04 MED ORDER — IPRATROPIUM-ALBUTEROL 0.5-2.5 (3) MG/3ML IN SOLN
3.0000 mL | RESPIRATORY_TRACT | Status: DC
  Administered 2015-03-04 – 2015-03-05 (×10): 3 mL via RESPIRATORY_TRACT
  Filled 2015-03-04 (×10): qty 3

## 2015-03-04 MED ORDER — PREDNISONE 50 MG PO TABS
50.0000 mg | ORAL_TABLET | Freq: Every day | ORAL | Status: DC
Start: 2015-03-04 — End: 2015-03-05
  Administered 2015-03-04 – 2015-03-05 (×2): 50 mg via ORAL
  Filled 2015-03-04 (×3): qty 1

## 2015-03-04 MED ORDER — IPRATROPIUM-ALBUTEROL 0.5-2.5 (3) MG/3ML IN SOLN
3.0000 mL | RESPIRATORY_TRACT | Status: DC | PRN
  Administered 2015-03-04: 3 mL via RESPIRATORY_TRACT

## 2015-03-04 MED ORDER — POTASSIUM CHLORIDE CRYS ER 20 MEQ PO TBCR: 40.0000 meq | EXTENDED_RELEASE_TABLET | Freq: Three times a day (TID) | ORAL | Status: DC

## 2015-03-04 MED ORDER — CLONAZEPAM 0.5 MG PO TABS
0.2500 mg | ORAL_TABLET | Freq: Two times a day (BID) | ORAL | Status: DC
  Administered 2015-03-04 – 2015-03-05 (×3): 0.25 mg via ORAL
  Filled 2015-03-04 (×3): qty 1

## 2015-03-04 MED ORDER — MIRTAZAPINE 7.5 MG PO TABS
7.5000 mg | ORAL_TABLET | Freq: Every day | ORAL | Status: DC
  Administered 2015-03-04 – 2015-03-06 (×3): 7.5 mg via ORAL
  Filled 2015-03-04 (×5): qty 1

## 2015-03-04 MED ORDER — ACETAMINOPHEN 650 MG RE SUPP
650.0000 mg | Freq: Four times a day (QID) | RECTAL | Status: DC | PRN
Start: 2015-03-04 — End: 2015-03-09

## 2015-03-04 MED ORDER — BUSPIRONE HCL 10 MG PO TABS
10.0000 mg | ORAL_TABLET | Freq: Two times a day (BID) | ORAL | Status: DC
  Administered 2015-03-04 – 2015-03-07 (×7): 10 mg via ORAL
  Filled 2015-03-04 (×11): qty 1

## 2015-03-04 MED ORDER — POTASSIUM CHLORIDE CRYS ER 20 MEQ PO TBCR
40.0000 meq | EXTENDED_RELEASE_TABLET | Freq: Once | ORAL | Status: DC
  Filled 2015-03-04: qty 2

## 2015-03-04 MED ORDER — HEPARIN SODIUM (PORCINE) 5000 UNIT/ML IJ SOLN
5000.0000 [IU] | Freq: Three times a day (TID) | INTRAMUSCULAR | Status: DC
  Administered 2015-03-04 – 2015-03-07 (×9): 5000 [IU] via SUBCUTANEOUS
  Filled 2015-03-04 (×12): qty 1

## 2015-03-04 MED ORDER — ENSURE ENLIVE PO LIQD
237.0000 mL | Freq: Two times a day (BID) | ORAL | Status: DC
  Administered 2015-03-04 (×2): 237 mL via ORAL

## 2015-03-04 MED ORDER — BUDESONIDE 0.5 MG/2ML IN SUSP
0.5000 mg | Freq: Two times a day (BID) | RESPIRATORY_TRACT | Status: DC
  Administered 2015-03-04 – 2015-03-07 (×7): 0.5 mg via RESPIRATORY_TRACT
  Filled 2015-03-04 (×9): qty 2

## 2015-03-04 MED ORDER — ASPIRIN EC 81 MG PO TBEC
81.0000 mg | DELAYED_RELEASE_TABLET | Freq: Every day | ORAL | Status: DC
  Administered 2015-03-04 – 2015-03-06 (×3): 81 mg via ORAL
  Filled 2015-03-04 (×4): qty 1

## 2015-03-04 MED ORDER — THIAMINE HCL 100 MG/ML IJ SOLN
100.0000 mg | Freq: Every day | INTRAMUSCULAR | Status: DC
  Administered 2015-03-06: 100 mg via INTRAVENOUS
  Filled 2015-03-04 (×4): qty 1

## 2015-03-04 MED ORDER — ACETAMINOPHEN 325 MG PO TABS
650.0000 mg | ORAL_TABLET | Freq: Four times a day (QID) | ORAL | Status: DC | PRN
  Administered 2015-03-04: 650 mg via ORAL
  Filled 2015-03-04: qty 2

## 2015-03-04 MED ORDER — SERTRALINE HCL 50 MG PO TABS
50.0000 mg | ORAL_TABLET | Freq: Every day | ORAL | Status: DC
  Filled 2015-03-04: qty 1

## 2015-03-04 MED ORDER — GUAIFENESIN ER 600 MG PO TB12
600.0000 mg | ORAL_TABLET | Freq: Two times a day (BID) | ORAL | Status: DC | PRN
  Filled 2015-03-04: qty 1

## 2015-03-04 MED ORDER — POLYETHYLENE GLYCOL 3350 17 G PO PACK
17.0000 g | PACK | Freq: Every day | ORAL | Status: DC
  Administered 2015-03-04 – 2015-03-05 (×2): 17 g via ORAL
  Filled 2015-03-04 (×4): qty 1

## 2015-03-04 MED ORDER — POTASSIUM CHLORIDE 10 MEQ/100ML IV SOLN: 10.0000 meq | INTRAVENOUS | Status: DC

## 2015-03-04 MED ORDER — SODIUM CHLORIDE 0.9 % IV SOLN
INTRAVENOUS | Status: AC
  Administered 2015-03-04 (×2): via INTRAVENOUS

## 2015-03-04 MED ORDER — FOLIC ACID 1 MG PO TABS
1.0000 mg | ORAL_TABLET | Freq: Every day | ORAL | Status: DC
  Filled 2015-03-04: qty 1

## 2015-03-04 MED ORDER — POTASSIUM CHLORIDE CRYS ER 20 MEQ PO TBCR
40.0000 meq | EXTENDED_RELEASE_TABLET | ORAL | Status: AC
  Administered 2015-03-04 (×3): 40 meq via ORAL
  Filled 2015-03-04 (×2): qty 2

## 2015-03-04 MED ORDER — FOLIC ACID 1 MG PO TABS
1.0000 mg | ORAL_TABLET | Freq: Every day | ORAL | Status: DC
  Administered 2015-03-04 – 2015-03-05 (×2): 1 mg via ORAL
  Filled 2015-03-04 (×2): qty 1

## 2015-03-04 NOTE — Progress Notes (Signed)
PCS form has been faxed to [facility].  Hunt Oris, MSW, [place]

## 2015-03-04 NOTE — Progress Notes (Signed)
Pt admitted to 3S11 by ER staff and RT. Pt is currently on BiPAP 12/5 40%, appears to have mild respiratory distress. RN aware, RT will monitor.

## 2015-03-04 NOTE — Consult Note (Signed)
Consultation Note Date: 03/04/2015   Patient Name: Hector House  DOB: 1946/02/17  MRN: [id number]  Age / Sex: 69 y.o., male   PCP: Hector Aloe, MD Referring Physician: Alveda Reasons, MD  Reason for Consultation: Establishing goals of care  Palliative Care Assessment and Plan Summary of Established Goals of Care and Medical Treatment Preferences   Clinical Assessment/Narrative:  Goals of Care:  - Met with Hector House in order to discuss the overall clinical course of his end stage lung disease which has continued to worsen with recurrent hospitalizations.  He was receptive to visit, but seemed have limited insight into his overall disease process which has included recurrent COPD exacerbations as well as this admission for HCAP with acute respiratory failure.   - Reports that he is "pretty sick" and that his disease has been worsening, but he believes that this is related to him "not getting the same medicines that they used to give me."  He was unwilling to elaborate on this, but did endorse again that he thinks that the treatments that he receives have changed rather that having progression of his disease.  He reports that he improves in the hospital because "too many professionals are involved to pull stuff in here."  On further questioning, he is able to verbalize that he is not going to be cured of his COPD.   - He continues to have a decrease in his functional status, and he reports that he would like to find a place to live where he could get more assistance, but they would need to let him bring his dog.      - He has not completed paperwork to name surrogate, but would like it to be his son, Hector House.  He reports that they were estranged for a long time, but have a good relationship now. - Will follow-up during this admission to continue discussion of his overall goal for his progressive disease.   Contacts/Participants in Discussion: Primary Decision Maker: Patient     HCPOA: no.  Patient reports that he would like to have his son, Hector House be his surrogate, but no forms completed in past.    Code Status/Advance Care Planning:  DNR  Symptom Management:   Dyspnea-  Reports that his dyspnea is improving with treatment of his HCAP.  Would continue with aggressive management of underlying disease.  May benefit from bedside fan blowing cool air over cheeks as this has been shown to reduce feelings of dyspnea.  He would be high risk for use of opioids for management of his dyspnea due to his concurrent benzo use, past history of substance (predominantly alcohol) abuse, and his propensity to take higher doses of his medications than prescribed.  Would not recommend initiation of opioids at this time.  Anxiety: He reports that his anxiety is large trigger for his dyspnea and finds his current benzodiazepines to be helpful in managing his anxiety and dyspnea.  Agree with stopping Xanax in favor of clonazepam as he reports taking both of these medications at home.  Would continue increasing SSRI therapy as tolerated to minimize need for additional benzodiazepines.    Additional Recommendations (Limitations, Scope, Preferences):  He reports that he would be willing to complete forms naming his son as his surrogate.  Will request chaplain to complete Power of Attorney forms if he remains agreeable. Psycho-social/Spiritual:   Support System: Reports his son and neighbors, but seems socially isolated and reports that his only  support is his dog.  His neighbor, Hector House, does bring him meals on a regular basis.  Desire for further Chaplaincy support:yes  Prognosis: Patient presented with PaCO2 of >64mmHg, which has approx 33% mortality at 6  Months.   Discharge Planning:  Continue to discuss with patient throughout admission       Chief Complaint/History of Present Illness:  69 year old male with PMHx severe COPD with recurrent admissions now  admitted with HCAP and possible COPD Exacerbation  Primary Diagnoses  Present on Admission:  . HCAP (healthcare-associated pneumonia) . Acute respiratory failure . COPD (chronic obstructive pulmonary disease) with emphysema . HYPERTENSION, BENIGN ESSENTIAL . Anxiety disorder due to medical condition . Protein-calorie malnutrition, severe  Palliative Review of Systems: Report dyspnea worsened with exertion. Reports increased anxiety. Reports some decrease in appetite. Unsure of last BM.  I have reviewed the medical record, interviewed the patient and family, and examined the patient. The following aspects are pertinent.  Past Medical History  Diagnosis Date  . Hypertension   . Low back pain syndrome     Joint Pain  . Emphysema   . S/P radiation therapy 07/23/10 - 09/14/10    Right Thigh for Leiomyosarcoma / 61.2 Gy / 34 Fractions  . Respiratory failure, acute 3/276/15 -11/04/13     Hospitalized  . Depression   . COPD (chronic obstructive pulmonary disease)   . 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 Pneumonia 1979  . Headache     "1-2/wk" (01/16/2015)  . Arthritis     "hands" (01/16/2015)  . Chronic back pain   . Anxiety   . On home oxygen therapy     "3L; 24/7" (02/11/2015)   History   Social History  . Marital Status: Divorced    Spouse Name: N/A  . Number of Children: N/A  . Years of Education: N/A   Occupational History  . retired. 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 Smoking status: Former Smoker -- 0.00 packs/day for 51 years    Types: Cigarettes    Quit date: 08/09/2014  . Smokeless tobacco: Never Used  . Alcohol Use: Yes     Comment: 02/11/2015 "stopped drinking in Jan"   . Drug Use: No  . Sexual Activity: Not on file   Other Topics Concern  . None   Social History Narrative   Pt is divorced and now single. Lives alone.   Drinks a 12 pack of beer a week.   Family History  Problem  Relation Age of Onset  . Heart disease Mother     heart attack   Scheduled Meds: . aspirin EC  81 mg Oral Daily  . budesonide  0.5 mg Nebulization BID  . busPIRone  10 mg Oral BID  . clonazePAM  0.25 mg Oral BID  . feeding supplement  1 Container Oral BID BM  . feeding supplement (ENSURE ENLIVE)  237 mL Oral BID BM  . folic acid  1 mg Oral Daily  . heparin  5,000 Units Subcutaneous 3 times per day  . ipratropium-albuterol  3 mL Nebulization Q4H  . mirtazapine  7.5 mg Oral QHS  . multivitamin with minerals  1 tablet Oral Daily  . piperacillin-tazobactam (ZOSYN)  IV  3.375 g Intravenous 3 times per day  . polyethylene glycol  17 g Oral Daily  . potassium chloride  40 mEq Oral Q2H  . predniSONE  50 mg Oral Q breakfast  . thiamine  100 mg Oral Daily   Or  . thiamine  100 mg Intravenous Daily  . vancomycin  1,000 mg Intravenous Q24H   Continuous Infusions:  PRN Meds:.acetaminophen **OR** acetaminophen, guaiFENesin, ipratropium-albuterol, LORazepam **OR** LORazepam Medications Prior to Admission:  Prior to Admission medications   Medication Sig Start Date End Date Taking? Authorizing Provider  albuterol (PROAIR HFA) 108 (90 BASE) MCG/ACT inhaler INHALE 2 PUFFS BY MOUTH EVERY 4 TO 6 HOURS AS NEEDED 02/03/15  Yes Hector Aloe, MD  ALPRAZolam Duanne Moron) 0.5 MG tablet Take 0.5 mg by mouth 2 (two) times daily as needed. 02/14/15  Yes Historical Provider, MD  aspirin 81 MG tablet Take 1 tablet (81 mg total) by mouth daily. 12/21/13  Yes Hector Aloe, MD  budesonide (PULMICORT) 0.5 MG/2ML nebulizer solution Take 2 mLs (0.5 mg total) by nebulization 2 (two) times daily. DX J43.2 02/19/15  Yes Juanito Doom, MD  busPIRone (BUSPAR) 10 MG tablet Take 1 tablet (10 mg total) by mouth 2 (two) times daily. 10/11/14  Yes Hector Aloe, MD  clonazePAM (KLONOPIN) 0.5 MG tablet Take 0.5 tablets (0.25 mg total) by mouth 2 (two) times daily. 02/24/15  Yes Hector Aloe, MD  feeding supplement (BOOST HIGH  PROTEIN) LIQD Take 237 mLs by mouth 2 (two) times daily between meals. 02/04/15  Yes Juanito Doom, MD  ipratropium-albuterol (DUONEB) 0.5-2.5 (3) MG/3ML SOLN INHALE 1 VIAL VIA NEBULIZER FOUR TIMES DAILY, CAN USE AN ADDITIONAL 2 TREATMENTS AS NEEDED 02/11/15  Yes Tanda Rockers, MD  irbesartan (AVAPRO) 75 MG tablet TAKE 1 TABLET BY MOUTH DAILY 02/18/15  Yes Tanda Rockers, MD  lisinopril-hydrochlorothiazide (PRINZIDE,ZESTORETIC) 20-25 MG per tablet Take 1 tablet by mouth daily. 02/25/15  Yes Historical Provider, MD  sertraline (ZOLOFT) 50 MG tablet TAKE 1 TABLET BY MOUTH DAILY 01/31/15  Yes Hector Aloe, MD  guaiFENesin (MUCINEX) 600 MG 12 hr tablet Take 1 tablet (600 mg total) by mouth 2 (two) times daily as needed. Patient taking differently: Take 600 mg by mouth 2 (two) times daily as needed for cough or to loosen phlegm.  12/21/13   Hector Aloe, MD  polyethylene glycol (MIRALAX / GLYCOLAX) packet Take 17 g by mouth daily. 02/20/15   Hector Aloe, MD   No Known Allergies CBC:    Component Value Date/Time   WBC 14.0* 03/04/2015 0704   WBC 12.1* 07/10/2010 1335   HGB 10.1* 03/04/2015 0704   HGB 16.6 07/10/2010 1335   HCT 30.8* 03/04/2015 0704   HCT 47.2 07/10/2010 1335   PLT 183 03/04/2015 0704   PLT 235 07/10/2010 1335   MCV 93.6 03/04/2015 0704   MCV 95.8 07/10/2010 1335   NEUTROABS 18.4* 02/25/2015 2114   NEUTROABS 10.0* 07/10/2010 1335   LYMPHSABS 0.8 02/09/2015 2114   LYMPHSABS 1.3 07/10/2010 1335   MONOABS 2.2* 02/15/2015 2114   MONOABS 0.7 07/10/2010 1335   EOSABS 0.0 02/21/2015 2114   EOSABS 0.1 07/10/2010 1335   BASOSABS 0.0 02/20/2015 2114   BASOSABS 0.1 07/10/2010 1335   Comprehensive Metabolic Panel:    Component Value Date/Time   NA 138 03/04/2015 1400   K 2.7* 03/04/2015 1400   CL 100* 03/04/2015 1400   CO2 32 03/04/2015 1400   BUN 13 03/04/2015 1400   CREATININE 0.53* 03/04/2015 1400   CREATININE 1.13 12/16/2014 1207   GLUCOSE 194* 03/04/2015 1400    CALCIUM 7.6* 03/04/2015 1400   AST  15 02/09/2015 2114   ALT 11* 02/22/2015 2114   ALKPHOS 73 02/18/2015 2114   BILITOT 1.8* 02/13/2015 2114   PROT 5.9* 03/07/2015 2114   ALBUMIN 2.8* 02/20/2015 2114    Physical Exam: Vital Signs: BP 113/56 mmHg  Pulse 88  Temp(Src) 97.8 F (36.6 C) (Oral)  Resp 22  Ht $R'5\' 8"'eS$  (1.727 m)  Wt 44.3 kg (97 lb 10.6 oz)  BMI 14.85 kg/m2  SpO2 94% SpO2: SpO2: 94 % O2 Device: O2 Device: Nasal Cannula O2 Flow Rate: O2 Flow Rate (L/min): 6 L/min Intake/output summary:  Intake/Output Summary (Last 24 hours) at 03/04/15 1704 Last data filed at 03/04/15 1500  Gross per 24 hour  Intake 3394.17 ml  Output    476 ml  Net 2918.17 ml   LBM:   Baseline Weight: Weight: 48.988 kg (108 lb) Most recent weight: Weight: 44.3 kg (97 lb 10.6 oz)  Exam Findings:  General: Cachectic male sitting up in bed, Alert and conversational Eyes: PERRLA ENTM:dry MM, edentulous, [place] in place Neck: accessory muscle usage noted Cardiovascular: RRR, No murmur Respiratory:some increased work of breathing, using accessory muscles. Scattered rhonchi, predominantly on right. Diminished esp at bases Abdomen: Non tender, soft, normal bowel sounds MSK: Thin/frail with orbital wasting Skin: erythremic rash on upper back Neuro: No focal deficits Psych: Normal mood and affect         Palliative Performance Scale: 50%              Additional Data Reviewed: Recent Labs     02/16/2015  2114  03/04/15  0704  03/04/15  1400  WBC  21.4*  14.0*   --   HGB  12.6*  10.1*   --   PLT  236  183   --   NA  137  140  138  BUN  $Re'18  11  13  'PbG$ CREATININE  0.73  0.56*  0.53*     Time In: 2:30 Time Out: 3:25 Time Total: 70 Greater than 50%  of this time was spent counseling and coordinating care related to the above assessment and plan.  Signed by: Micheline Rough, MD  Micheline Rough, MD  03/04/2015, 5:04 PM  Please contact Palliative Medicine Team phone at [phone] for questions and concerns.

## 2015-03-04 NOTE — Progress Notes (Signed)
RT called due to patients sats dropping. RT placed patient back on BIPAP.

## 2015-03-04 NOTE — Evaluation (Addendum)
Occupational Therapy Evaluation Patient Details Name: Hector House MRN: [id number] DOB: 1946/01/14 Today's Date: 03/04/2015    History of Present Illness Acute Respiratory Failure and severe sepsis 2/2 HCAP, also with possible COPD exacerbation   Clinical Impression   This 69 yo male admitted with above presents to acute OT with decreased endurance, increased WOB, decreased balance, decreased mobility, decreased cognition (contradicts himself) all affecting his ability to live at home alone and take care of himself at an adequate level. He will benefit from acute OT with follow up at SNF to get back to a level he can be at home alone.    Follow Up Recommendations  SNF    Equipment Recommendations   (TBD at next venue)       Precautions / Restrictions Precautions Precautions: Fall Precaution Comments: monitor O2 sats, pt extremely anxious with mobility and his breathing Restrictions Weight Bearing Restrictions: No      Mobility Bed Mobility Overal bed mobility: Needs Assistance Bed Mobility: Sit to Supine       Sit to supine: Supervision      Transfers Overall transfer level: Needs assistance   Transfers: Squat Pivot Transfers Sit to Stand: Supervision Stand pivot transfers: Min assist Squat pivot transfers: Min guard     General transfer comment: for safety    Balance Overall balance assessment: Needs assistance Sitting-balance support: Feet supported;Single extremity supported Sitting balance-Leahy Scale: Fair Sitting balance - Comments: sat on BSC draped over the bed as if tired, but able to sit up and even wipe himself .                                    ADL Overall ADL's : Needs assistance/impaired Eating/Feeding: Set up;Bed level   Grooming: Set up;Bed level   Upper Body Bathing: Set up;Bed level   Lower Body Bathing: Maximal assistance;Bed level   Upper Body Dressing : Maximal assistance;Bed level   Lower Body Dressing: Total  assistance;Bed level   Toilet Transfer: Minimal assistance;Stand-pivot;Squat-pivot;BSC Toilet Transfer Details (indicate cue type and reason): But very unsafe with transfer--"catapultated/threw himself into bed from [place] and Hygiene: Moderate assistance;Sitting/lateral lean         General ADL Comments: All activities take increased time due to WOB     Vision Additional Comments: No change from baseline          Pertinent Vitals/Pain Pain Assessment: 0-10 Pain Score: 5  Pain Location: HA Pain Descriptors / Indicators: Aching Pain Intervention(s): Monitored during session     Hand Dominance Right   Extremity/Trunk Assessment Upper Extremity Assessment Upper Extremity Assessment: Overall WFL for tasks assessed (does have some RA at MCPs that have caused hands to stay partially flexed at these joints)   Lower Extremity Assessment Lower Extremity Assessment:  (difficult to assess due to limited cooperation)       Communication Communication Communication: No difficulties   Cognition Arousal/Alertness: Awake/alert Behavior During Therapy: Anxious Overall Cognitive Status: Impaired/Different from baseline Area of Impairment: Safety/judgement         Safety/Judgement: Decreased awareness of safety     General Comments: He said a washcloth would be good to help clean after a bowel movement, so we got one, but then when we asked him to stand up to get cleaned up he said what are you doing I've already wiped.  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      Prior Functioning/Environment Level of Independence: Independent        Comments: Very limited activity tolerance and spends a lot of time in bed or seated    OT Diagnosis: Generalized weakness   OT Problem List: Decreased strength;Decreased activity tolerance;Impaired balance (sitting and/or standing);Cardiopulmonary status limiting activity;Decreased knowledge of use of DME or AE;Decreased cognition   OT Treatment/Interventions: Self-care/ADL training;Patient/family education;Balance training;Energy conservation;Therapeutic activities;DME and/or AE instruction;Therapeutic exercise    OT Goals(Current goals can be found in the care plan section) Acute Rehab OT Goals Patient Stated Goal: to go home OT Goal Formulation: With patient Time For Goal Achievement: 03/18/15 Potential to Achieve Goals: Good  OT Frequency: Min 2X/week   Barriers to D/C: Decreased caregiver support          Co-evaluation PT/OT/SLP Co-Evaluation/Treatment: Yes Reason for Co-Treatment: For patient/therapist safety   OT goals addressed during session: ADL's and self-care;Strengthening/ROM      End of Session Equipment Utilized During Treatment: Oxygen Nurse Communication:  (nurse already aware of how he transfers)  Activity Tolerance:  (limited by increased WOB) Patient left: in bed;with call bell/phone within reach   Time: 3244-0102 OT Time Calculation (min): 20 min Charges:  OT General Charges $OT Visit: 1 Procedure OT Evaluation $Initial OT Evaluation Tier I: 1 Procedure G-Codes: OT G-codes **NOT FOR INPATIENT CLASS** Functional Assessment Tool Used: Clinical observation Functional Limitation: Self care Self Care Current Status (V2536): At least  60 percent but less than 80 percent impaired, limited or restricted Self Care Goal Status (U4403): At least 40 percent but less than 60 percent impaired, limited or restricted  Almon Register [phone] 03/04/2015, 11:50 AM

## 2015-03-04 NOTE — Progress Notes (Signed)
Pt is currently off Bipap at this time. Pt is resting comfortable and states that he is tired, he is receiving his breathing treatment at this time. Pt tolerating well. Pt O2 saturation are 97% on 3L of O2.

## 2015-03-04 NOTE — Progress Notes (Signed)
   03/04/15 1600  Clinical Encounter Type  Visited With Patient  Visit Type Initial;Spiritual support;Psychological support;Social support  Referral From Nurse  Consult/Referral To Chaplain  Spiritual Encounters  Spiritual Needs Emotional  Stress Factors  Patient Stress Factors Major life changes;Health changes;Family relationships;Other (Comment) (worried about pet)  [place] visited with pt.  Pt very chatty; worried about transitioning to assisted living; family disconnected; only family and emotional support is dog Archimedes (Barnabas Lister russell terrier mix)

## 2015-03-04 NOTE — Evaluation (Signed)
Physical Therapy Evaluation Patient Details Name: Hector House MRN: [id number] DOB: [birth year] Today's Date: 03/04/2015   History of Present Illness  Acute Respiratory Failure and severe sepsis 2/2 HCAP, also with possible COPD exacerbation  Clinical Impression  Pt admitted with/for respiratory failure with HCAP.  Pt currently limited functionally due to the problems listed. ( See problems list.)   Pt will benefit from PT to maximize function and safety in order to get ready for next venue listed below.     Follow Up Recommendations SNF    Equipment Recommendations  None recommended by PT    Recommendations for Other Services       Precautions / Restrictions Precautions Precautions: Fall Precaution Comments: monitor O2 sats, pt extremely anxious with mobility and his breathing Restrictions Weight Bearing Restrictions: No      Mobility  Bed Mobility Overal bed mobility: Needs Assistance Bed Mobility: Sit to Supine       Sit to supine: Supervision      Transfers Overall transfer level: Needs assistance   Transfers: Squat Pivot Transfers Sit to Stand: Supervision Stand pivot transfers: Min assist Squat pivot transfers: Min guard     General transfer comment: for safety  Ambulation/Gait             General Gait Details: pt too anxious to entertain thoughts of doing any activity on his feet  Stairs            Wheelchair Mobility    Modified Rankin (Stroke Patients Only)       Balance Overall balance assessment: Needs assistance Sitting-balance support: No upper extremity supported;Feet supported;Single extremity supported Sitting balance-Leahy Scale: Fair Sitting balance - Comments: sat on BSC draped over the bed as if tired, but able to sit up and even wipe himself .                                     Pertinent Vitals/Pain Pain Assessment: 0-10 Pain Score: 5  Pain Location: HA Pain Descriptors / Indicators: Aching Pain  Intervention(s): Monitored during session    Home Living Family/patient expects to be discharged to:: Skilled nursing facility Living Arrangements: Alone Available Help at Discharge: Friend(s);Available PRN/intermittently Type of Home: Apartment Home Access: Level entry     Home Layout: One level Home Equipment: Walker - 2 wheels;Cane - single point;Walker - 4 wheels;Shower seat;Wheelchair - manual Additional Comments: States he had equipment but does not use any of it.    Prior Function Level of Independence: Independent         Comments: Very limited activity tolerance and spends a lot of time in bed or seated     Hand Dominance   Dominant Hand: Right    Extremity/Trunk Assessment   Upper Extremity Assessment: Defer to OT evaluation           Lower Extremity Assessment:  (difficult to assess due to limited cooperation)         Communication   Communication: No difficulties  Cognition Arousal/Alertness: Awake/alert Behavior During Therapy: Anxious Overall Cognitive Status: Impaired/Different from baseline Area of Impairment: Safety/judgement         Safety/Judgement: Decreased awareness of safety     General Comments: He said a washcloth would be good to help clean after a bowel movement, so we got one, but then when we asked him to stand up to get cleaned up he said what are you  doing I've already wiped.  Also stated he wanted Tylenol, then went through a mental list of pain meds and said that Tylenol didn't do "S**" and only (naproxen) worked    Pension scheme manager comments (skin integrity, edema, etc.): Sats generally greater than 92 on 45% O2 on Venti mask.  Anxiety limiting treatment    Exercises        Assessment/Plan    PT Assessment Patient needs continued PT services  PT Diagnosis Other (comment);Generalized weakness (intolerance to activity)   PT Problem List Decreased activity tolerance;Decreased mobility;Cardiopulmonary status  limiting activity;Decreased safety awareness;Decreased knowledge of precautions;Decreased balance  PT Treatment Interventions DME instruction;Gait training;Functional mobility training;Therapeutic activities;Therapeutic exercise;Balance training;Patient/family education   PT Goals (Current goals can be found in the Care Plan section) Acute Rehab PT Goals Patient Stated Goal: to go home PT Goal Formulation: With patient Time For Goal Achievement: 02/25/15 Potential to Achieve Goals: Good    Frequency Min 3X/week   Barriers to discharge Decreased caregiver support      Co-evaluation               End of Session Equipment Utilized During Treatment: Oxygen Activity Tolerance: Other (comment);Patient limited by fatigue (limited by anxiety) Patient left: in bed;with call bell/phone within reach Nurse Communication: Mobility status    Functional Assessment Tool Used: clinical judgement Functional Limitation: Mobility: Walking and moving around Mobility: Walking and Moving Around Current Status (G8811): At least 1 percent but less than 20 percent impaired, limited or restricted Mobility: Walking and Moving Around Goal Status [phone]): At least 1 percent but less than 20 percent impaired, limited or restricted    Time: 4585-9292 PT Time Calculation (min) (ACUTE ONLY): 20 min   Charges:   PT Evaluation $Initial PT Evaluation Tier I: 1 Procedure     PT G Codes:   PT G-Codes **NOT FOR INPATIENT CLASS** Functional Assessment Tool Used: clinical judgement Functional Limitation: Mobility: Walking and moving around Mobility: Walking and Moving Around Current Status (K4628): At least 1 percent but less than 20 percent impaired, limited or restricted Mobility: Walking and Moving Around Goal Status [phone]): At least 1 percent but less than 20 percent impaired, limited or restricted    Levern Kalka, Tessie Fass 03/04/2015, 11:28 AM 03/04/2015  Donnella Sham, PT [phone]   (pager)

## 2015-03-04 NOTE — Progress Notes (Signed)
RN aware that patient has been titrated to 3L. RT will continue to monitor.

## 2015-03-04 NOTE — Telephone Encounter (Signed)
lmomtcb x1 

## 2015-03-04 NOTE — Progress Notes (Signed)
Initial Nutrition Assessment  DOCUMENTATION CODES:   Severe malnutrition in context of chronic illness, Underweight  INTERVENTION:   Ensure Enlive po TID, each supplement provides 350 kcal and 20 grams of protein   NUTRITION DIAGNOSIS:   Malnutrition related to chronic illness as evidenced by severe depletion of body fat, severe depletion of muscle mass.   GOAL:   Patient will meet greater than or equal to 90% of their needs   MONITOR:   PO intake, Supplement acceptance, Labs, I & O's  REASON FOR ASSESSMENT:   Consult Assessment of nutrition requirement/status (malnutrition)  ASSESSMENT:   69 y.o. male presenting with shortness of breath . PMH is significant for COPD, HTN, and malnutrition. On 3 L O2 at home.   Pt with hx of ETOH abuse. At visit today pt was confused and without his O2 on. I assisted him with his O2 and notified RN. Pt unable to answer any questions at this time.  Nutrition-Focused physical exam completed. Findings are severe fat depletion, severe muscle depletion, and no edema.  Palliative care following for goals of care.   Labs reviewed: potassium low Medications reviewed and include: folic acid, remeron, MVI, miralax, prednisone, K-Dur, thiamine  Per MD notes pt likely at end-stage COPD. Palliative care consult pending.   Diet Order:  Diet Heart Room service appropriate?: Yes; Fluid consistency:: Thin  Skin:  Wound (see comment) (stage I sacrum)  Last BM:  PTA  Height:   Ht Readings from Last 1 Encounters:  03/04/15 5\' 8"  (1.727 m)    Weight:   Wt Readings from Last 1 Encounters:  03/04/15 97 lb 10.6 oz (44.3 kg)    Ideal Body Weight:  67.2 kg  Wt Readings from Last 10 Encounters:  03/04/15 97 lb 10.6 oz (44.3 kg)  02/20/15 95 lb 1 oz (43.12 kg)  02/11/15 95 lb 7.4 oz (43.3 kg)  02/04/15 93 lb (42.185 kg)  01/17/15 97 lb 7.1 oz (44.2 kg)  12/16/14 96 lb 4.8 oz (43.681 kg)  12/11/14 92 lb 11.2 oz (42.048 kg)  10/11/14 110 lb  14.4 oz (50.304 kg)  09/06/14 107 lb 6.4 oz (48.716 kg)  08/01/14 101 lb (45.813 kg)    BMI:  Body mass index is 14.85 kg/(m^2).  Estimated Nutritional Needs:   Kcal:  1500-1700  Protein:  70-80 grams  Fluid:  > 1.5 L/day  EDUCATION NEEDS:   No education needs identified at this time  [place], [place], [place] Pager [phone] After Hours Pager

## 2015-03-05 DIAGNOSIS — J9601 Acute respiratory failure with hypoxia: Secondary | ICD-10-CM | POA: Insufficient documentation

## 2015-03-05 DIAGNOSIS — E43 Unspecified severe protein-calorie malnutrition: Secondary | ICD-10-CM

## 2015-03-05 DIAGNOSIS — I10 Essential (primary) hypertension: Secondary | ICD-10-CM

## 2015-03-05 DIAGNOSIS — R0602 Shortness of breath: Secondary | ICD-10-CM

## 2015-03-05 DIAGNOSIS — J189 Pneumonia, unspecified organism: Secondary | ICD-10-CM

## 2015-03-05 DIAGNOSIS — J441 Chronic obstructive pulmonary disease with (acute) exacerbation: Secondary | ICD-10-CM

## 2015-03-05 LAB — BASIC METABOLIC PANEL
ANION GAP: 8 (ref 5–15)
Anion gap: 7 (ref 5–15)
BUN: 10 mg/dL (ref 6–20)
BUN: 8 mg/dL (ref 6–20)
CHLORIDE: 100 mmol/L — AB (ref 101–111)
CHLORIDE: 99 mmol/L — AB (ref 101–111)
CO2: 34 mmol/L — ABNORMAL HIGH (ref 22–32)
CO2: 32 mmol/L (ref 22–32)
Calcium: 8.2 mg/dL — ABNORMAL LOW (ref 8.9–10.3)
Calcium: 8.2 mg/dL — ABNORMAL LOW (ref 8.9–10.3)
Creatinine, Ser: 0.52 mg/dL — ABNORMAL LOW (ref 0.61–1.24)
Creatinine, Ser: 0.49 mg/dL — ABNORMAL LOW (ref 0.61–1.24)
GFR calc non Af Amer: >60 mL/min (ref >60)
GFR calc non Af Amer: >60 mL/min (ref >60)
GLUCOSE: 144 mg/dL — AB (ref 65–99)
GLUCOSE: 112 mg/dL — AB (ref 65–99)
Potassium: 2.5 mmol/L — CL (ref 3.5–5.1)
Potassium: 3.3 mmol/L — ABNORMAL LOW (ref 3.5–5.1)
SODIUM: 141 mmol/L (ref 135–145)
SODIUM: 139 mmol/L (ref 135–145)

## 2015-03-05 LAB — URINE CULTURE: Culture: NO GROWTH

## 2015-03-05 LAB — MAGNESIUM: MAGNESIUM: 1.8 mg/dL (ref 1.7–2.4)

## 2015-03-05 MED ORDER — POTASSIUM CHLORIDE 10 MEQ/100ML IV SOLN
10.0000 meq | INTRAVENOUS | Status: AC
  Administered 2015-03-05 (×3): 10 meq via INTRAVENOUS
  Filled 2015-03-05 (×2): qty 100

## 2015-03-05 MED ORDER — METHYLPREDNISOLONE SODIUM SUCC 125 MG IJ SOLR
60.0000 mg | Freq: Three times a day (TID) | INTRAMUSCULAR | Status: DC
  Administered 2015-03-05 – 2015-03-07 (×7): 60 mg via INTRAVENOUS
  Filled 2015-03-05: qty 0.96
  Filled 2015-03-05: qty 2
  Filled 2015-03-05: qty 0.96
  Filled 2015-03-05: qty 2
  Filled 2015-03-05 (×4): qty 0.96

## 2015-03-05 MED ORDER — ARFORMOTEROL TARTRATE 15 MCG/2ML IN NEBU
15.0000 ug | INHALATION_SOLUTION | Freq: Two times a day (BID) | RESPIRATORY_TRACT | Status: DC
  Administered 2015-03-05 – 2015-03-06 (×3): 15 ug via RESPIRATORY_TRACT
  Filled 2015-03-05 (×6): qty 2

## 2015-03-05 MED ORDER — MAGNESIUM SULFATE 2 GM/50ML IV SOLN
2.0000 g | Freq: Once | INTRAVENOUS | Status: AC
  Administered 2015-03-05: 2 g via INTRAVENOUS
  Filled 2015-03-05: qty 50

## 2015-03-05 MED ORDER — ENSURE ENLIVE PO LIQD
237.0000 mL | Freq: Three times a day (TID) | ORAL | Status: DC
  Administered 2015-03-06 (×2): 237 mL via ORAL

## 2015-03-05 MED ORDER — METHYLPREDNISOLONE SODIUM SUCC 40 MG IJ SOLR
40.0000 mg | Freq: Four times a day (QID) | INTRAMUSCULAR | Status: DC
  Filled 2015-03-05 (×4): qty 1

## 2015-03-05 MED ORDER — IPRATROPIUM-ALBUTEROL 0.5-2.5 (3) MG/3ML IN SOLN: 3.0000 mL | RESPIRATORY_TRACT | Status: DC | PRN

## 2015-03-05 MED ORDER — POTASSIUM CHLORIDE 10 MEQ/100ML IV SOLN
10.0000 meq | INTRAVENOUS | Status: AC
  Administered 2015-03-05 (×4): 10 meq via INTRAVENOUS
  Filled 2015-03-05 (×3): qty 100

## 2015-03-05 MED ORDER — CLONAZEPAM 0.5 MG PO TABS
0.5000 mg | ORAL_TABLET | Freq: Two times a day (BID) | ORAL | Status: DC
  Administered 2015-03-05 – 2015-03-07 (×4): 0.5 mg via ORAL
  Filled 2015-03-05 (×4): qty 1

## 2015-03-05 NOTE — Consult Note (Signed)
   [facility] Inpatient Consult   03/05/2015  Hector House 1946/07/10 [id number] Patient is currently active with [facility] for chronic disease management services.  Patient has been engaged by a [employer], pharmacist,  and CSW.  Our community based plan of care has focused on disease management and community resource support.  Patient will receive a post discharge transition of care call and will be evaluated for monthly home visits for assessments and disease process education. Will make  Inpatient Case Manager aware that [facility] following. Of note, [facility] Care Management services does not replace or interfere with any services that are arranged by inpatient case management or social work.  For additional questions or referrals please contact: Natividad Brood, RN BSN [facility] Liaison  [phone] business mobile phone

## 2015-03-05 NOTE — Progress Notes (Signed)
Family Medicine Teaching Service Daily Progress Note Intern Pager: [phone]  Patient name: Hector House Medical record number: [id number] Date of birth: 1945/10/29 Age: 69 y.o. Gender: male  Primary Care Provider: [name], MD Consultants: palliative  Code Status: DNR  Pt Overview and Major Events to Date:  07/26: Admitted to step down unit  Assessment and Plan:  Acute Respiratory Failure and severe sepsis 2/2 HCAP, also with possible COPD exacerbation: Acute worsening of dyspnea not related to exertion with increased O2 requirement. Admits to productive cough, fever/chills, and left sided chest pain. Meets SIRS criteria on admission with tachypnea, tachycardia, and elevated WBC (21). BP hypotensive to 55D systolic on admission as well. S/p CAT and BiPAP in ED, now on 4L [place] but continuing to be in mild respiratory distress with accessory muscle use. CXR in ED revealed multifocal pneumonia. Initially wheezing per report, now s/p duonebs, solumedrol, and BiPAP, so will treat empirically for COPD exacerbation as well. Will need to rule out cardiac causes of chest pain/SOB. BNP 151 although BNP has been in that range previously. Last Echo in 10/2013 with EF 50-55% and normal diastolic function. No evidence of volume overload on exam with dry MM and no crackles or edema and hypotension on exam. EKG revealed sinus tachycardia with PVCs (nonischemic) and initial troponin negative. ABG with normal pH and mild hypercapnea. - Continue monitoring respiratory status and O2 requirement - low threshold to call CCM if not protecting airway or not improving on BiPAP - O2 via [place]/mask or BiPAP prn - Continue Vanc/Zosyn for treatment of HCAP - Continue home Pulmicort neb BID - Duoneb q4 scheduled with Duoneb q2 PRN - IVF can re-bolus prn - Prednisone 50mg  for 3 more days  - start IV steriods - Consider CCM consult   Hypotension with h/o hypertension: on ARB-HCTZ at home.Currently hypotensive in setting  of sepsis. -Hold home irbesartan, Lisinopril, and HCTZ -Monitor BP -IVF as above, can re-bolus prn to maintain sBP >90  Hypokalemia: K 2.7>>2.5>>3.3 -Replete with IV 67meq/100mL q1h x4 as he is having difficulty with PO medication at this time with delirium and increased work of breathing - Continue to monitor  UA with granular casts, protein, urobilinogen, ketones: C/w dehydration in setting of sepsis.  - IVF as above - f/u UA and Urine cx  Anxiety: Mood stable  -Hold home Xanax for now as patient is taking 2 different benzos at home -Monitor for sx of anxiety - Continue home Zoloft 50 mg qd and Klonopin BID - Continue home buspar  Protein-calorie malnutrition, severe: chronic - Continue home Boost high protein oral nutrition supplements - Nutrition consulted  Tobacco Abuse - Chronic, 2 packs/day for 50 years - Admits to smoking 5 cigarettes/day for the past 2 weeks. - tobacco cessation counseling - nicotine patch upon request  Alcohol Abuse: Patient reported he solves his problems with "[employer]". -CIWA protocol for now  Abdominal Pain:  -KUB unremarkable for fecal impaction, lots of gas -Monitor  Delerium: Developed delerium last night, only oriented to person but not place or time. Is pulling out his lines and pulling off tele.  -ABG ordered -Continue treatment for sepsis -Monitor vitals closely -Take off any unnecessary PO meds for now (removed multivitamin and folic acid)  End of Life Care -Palliative consulted -Consult CCM today  FEN/GI: NS @100mL /hr x12h, heart healthy diet Prophylaxis: sub q heparin   Disposition: Status pending improvement of sepsis  Subjective:  Patient is not doing well today, he is having increased  work of breathing and delirium. He is not able to answer many of my questions.   Objective: Temp:  [97.7 F (36.5 C)-98.6 F (37 C)] 98.6 F (37 C) (07/27 0803) Pulse Rate:  [83-108] 108 (07/27 0803) Resp:  [17-34] 29 (07/26  2324) BP: (96-153)/(47-75) 153/73 mmHg (07/27 0803) SpO2:  [93 %-100 %] 100 % (07/27 0355) FiO2 (%):  [45 %] 45 % (07/26 1200) Physical Exam: General: Cachetic male sitting up in bed Cardiovascular: RRR, No rubs, gallops or murmurs. Normal s1 and s2 Respiratory: increased work of breathing, using accessory muscles with supraclavicular retractions and subcostal retractions. Crackles and wheezing in bilateral lung fields. Rhonchorus breath sounds in RUL and RML on my exam, no wheezes. Diminished breath sounds at bases Abdomen: Non tender, soft, normal bowel sounds Extremities: no edema  Laboratory:  Recent Labs Lab 02/15/2015 2114 03/04/15 0704  WBC 21.4* 14.0*  HGB 12.6* 10.1*  HCT 37.2* 30.8*  PLT 236 183    Recent Labs Lab 02/15/2015 2114  03/04/15 1400 03/05/15 0240 03/05/15 1035  NA 137  < > 138 141 139  K 2.8*  < > 2.7* 2.5* 3.3*  CL 88*  < > 100* 100* 99*  CO2 38*  < > 32 34* 32  BUN 18  < > 13 10 8   CREATININE 0.73  < > 0.53* 0.52* 0.49*  CALCIUM 8.8*  < > 7.6* 8.2* 8.2*  PROT 5.9*  --   --   --   --   BILITOT 1.8*  --   --   --   --   ALKPHOS 73  --   --   --   --   ALT 11*  --   --   --   --   AST 15  --   --   --   --   GLUCOSE 147*  < > 194* 144* 112*  < > = values in this interval not displayed.  Imaging/Diagnostic Tests: No results found.   Carlyle Dolly, MD 03/05/2015, 9:09 AM PGY-1, [place] Intern pager: [phone], text pages welcome

## 2015-03-05 NOTE — Clinical Social Work Note (Signed)
CSW Consult Acknowledged:   CSW received a consult for self neglect and SNF placement. Due to pt's orientation level the CSW cannot completed assessment. CSW attempted to call pt's sister [name] twice, but no answer and voice message was fill. CSW will continue to reach family.    [place], MSW, [place]

## 2015-03-05 NOTE — Telephone Encounter (Signed)
lmtcb x2 for [employer].

## 2015-03-05 NOTE — Progress Notes (Signed)
Reduced FIO2 to 50%. Sat 98%

## 2015-03-05 NOTE — Consult Note (Signed)
Name: Hector House MRN: [id number] DOB: 07-May-1946    ADMISSION DATE:  02/12/2015 CONSULTATION DATE:  03/05/2015  REFERRING MD :  FMTS  CHIEF COMPLAINT:  SOB  BRIEF PATIENT DESCRIPTION: 69 year old male with PMH of severe COPD on home O2 (KC/BQ patient) who was admitted to [facility] 7/26 for severe sepsis from HCAP on top of possible COPD exacerbation. He was started on vanc/zosyn. 7/27 with worsening hypoxemia. PCCM to see.   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 He was most recently seen in the pulmonary office by Dr. Lake Bells 6/28 with complaints of DOE. At that time he reported that his breathing has been getting progressively worse and he was using his albuterol inhaler several times a day, using an entire inhaler in about 2 weeks time. He had cough productive for grey sputum and had no peripheral edema. He was recently prescribed oxygen concentrater, but was unclear how to use it. He was still smoking at that time.  Formoterol BID was added to his COPD regimen of duonebs, pulmicort, and PRN albuterol. 7/1 he was admitted to [facility] for COPD exacerbation, initially requiring BiPAP. He progressed rapidly with nebulized BDs, steroids, and systemic steroids. Weaned off BiPAP to home O2 and was discharged 7/4.   7/27 he again presented [facility] ED complaining of DOE refractory to increasing home O2. He met SIRS criteria with tachypnea, tachycardia, and WBC 21. He was hypotensive with SBP in 80s. CXR in ED indicated multifocal PNA. Could also not rule out COPD exacerbation at that time. He was admitted and started on Vanc/Zosyn, as well as the usual COPD treatment regimen including nebulized bronchodilators, steroids, and PO prednisone. He was given IVF for hypotension which he has responded well to. 7/27 he developed  acute respiratory distress with accessory muscle use requiring BiPAP. PCCM consulted.    PAST MEDICAL HISTORY :   has a past medical history of Hypertension; Low back pain syndrome; Emphysema; S/P radiation therapy (07/23/10 - 09/14/10); Respiratory failure, acute (3/276/15 -11/04/13); Depression; COPD (chronic obstructive pulmonary disease); Leiomyosarcoma of thigh (2011); Pneumonia (1979); Headache; Arthritis; Chronic back pain; Anxiety; and On home oxygen therapy.  has past surgical history that includes Elbow fracture surgery (Left, ~ 1964); Leg skin lesion  biopsy / excision (Right, 04/20/10; 06/18/10); Back surgery (X 4); Elbow surgery (Left); left heart catheterization with coronary angiogram (N/A, 11/02/2013); Cardiac catheterization; Lymph node dissection (Right, 06/2010); Repair dural / CSF leak (01/06/2007; 01/19/2007); Lumbar laminectomy/decompression microdiscectomy (~ 11/2006; 01/19/2007); and Colonoscopy w/ biopsies and polypectomy. Prior to Admission medications   Medication Sig Start Date End Date Taking? Authorizing Provider  albuterol (PROAIR HFA) 108 (90 BASE) MCG/ACT inhaler INHALE 2 PUFFS BY MOUTH EVERY 4 TO 6 HOURS AS NEEDED 02/03/15  Yes Mariel Aloe, MD  ALPRAZolam Duanne Moron) 0.5 MG tablet Take 0.5 mg by mouth 2 (two) times daily as needed. 02/14/15  Yes Historical Provider, MD  aspirin 81 MG tablet Take 1 tablet (81 mg total) by mouth daily. 12/21/13  Yes Mariel Aloe, MD  budesonide (PULMICORT) 0.5 MG/2ML nebulizer solution Take 2 mLs (0.5 mg total) by nebulization 2 (two) times daily. DX J43.2 02/19/15  Yes Juanito Doom, MD  busPIRone (BUSPAR) 10 MG tablet Take 1 tablet (10 mg total) by mouth 2 (  two) times daily. 10/11/14  Yes Mariel Aloe, MD  clonazePAM (KLONOPIN) 0.5 MG tablet Take 0.5 tablets (0.25 mg total) by mouth 2 (two) times daily. 02/24/15  Yes Mariel Aloe, MD  feeding supplement (BOOST HIGH PROTEIN) LIQD Take 237 mLs by mouth 2 (two) times daily between meals. 02/04/15   Yes Juanito Doom, MD  ipratropium-albuterol (DUONEB) 0.5-2.5 (3) MG/3ML SOLN INHALE 1 VIAL VIA NEBULIZER FOUR TIMES DAILY, CAN USE AN ADDITIONAL 2 TREATMENTS AS NEEDED 02/11/15  Yes Tanda Rockers, MD  irbesartan (AVAPRO) 75 MG tablet TAKE 1 TABLET BY MOUTH DAILY 02/18/15  Yes Tanda Rockers, MD  lisinopril-hydrochlorothiazide (PRINZIDE,ZESTORETIC) 20-25 MG per tablet Take 1 tablet by mouth daily. 02/25/15  Yes Historical Provider, MD  sertraline (ZOLOFT) 50 MG tablet TAKE 1 TABLET BY MOUTH DAILY 01/31/15  Yes Mariel Aloe, MD  guaiFENesin (MUCINEX) 600 MG 12 hr tablet Take 1 tablet (600 mg total) by mouth 2 (two) times daily as needed. Patient taking differently: Take 600 mg by mouth 2 (two) times daily as needed for cough or to loosen phlegm.  12/21/13   Mariel Aloe, MD  polyethylene glycol (MIRALAX / GLYCOLAX) packet Take 17 g by mouth daily. 02/20/15   Mariel Aloe, MD  sertraline (ZOLOFT) 50 MG tablet TAKE 1 TABLET BY MOUTH DAILY 03/04/15   Mariel Aloe, MD   No Known Allergies  FAMILY HISTORY:  family history includes Heart disease in his mother. SOCIAL HISTORY:  reports that he quit smoking about 6 months ago. His smoking use included Cigarettes. He smoked 0.00 packs per day for 51 years. He has never used smokeless tobacco. He reports that he drinks alcohol. He reports that he does not use illicit drugs.  REVIEW OF SYSTEMS:  Unable due to encephalopathy  SUBJECTIVE:   VITAL SIGNS: Temp:  [97.8 F (36.6 C)-98.6 F (37 C)] 98.1 F (36.7 C) (07/27 1146) Pulse Rate:  [85-108] 108 (07/27 0803) Resp:  [22-29] 29 (07/26 2324) BP: (106-153)/(47-75) 153/73 mmHg (07/27 0803) SpO2:  [90 %-100 %] 93 % (07/27 1335) FiO2 (%):  [45 %-100 %] 100 % (07/27 1335)  PHYSICAL EXAMINATION: General:  Chronically ill, malnourished appearing male in NAD on BiPAP Neuro:  Spontaneously alert, disoriented.  HEENT: [place]/AT, no JVD, PERRL Cardiovascular:  Tachy, regular, no mRG Lungs:  Expiratory  wheeze R>L Abdomen:  Soft, non-tender, non-distended Musculoskeletal:  No acute deformity or ROM limition Skin:  Grossly intact   Recent Labs Lab 03/04/15 1400 03/05/15 0240 03/05/15 1035  NA 138 141 139  K 2.7* 2.5* 3.3*  CL 100* 100* 99*  CO2 32 34* 32  BUN $Re'13 10 8  'vXv$ CREATININE 0.53* 0.52* 0.49*  GLUCOSE 194* 144* 112*    Recent Labs Lab 02/23/2015 2114 03/04/15 0704  HGB 12.6* 10.1*  HCT 37.2* 30.8*  WBC 21.4* 14.0*  PLT 236 183   Dg Abd 1 View  03/04/2015   CLINICAL DATA:  Abdominal pain  EXAM: ABDOMEN - 1 VIEW  COMPARISON:  Chest x-ray of March 03, 2015  FINDINGS: There is mild gaseous distention of the stomach. There is a moderate volume of gas within small and large bowel. The pattern not appear obstructive. There is no free extraluminal gas. The thoracolumbar spine exhibits gentle dextro curvature which may be positional. There are no abnormal soft tissue calcifications. There are emphysematous changes at the lung bases.  IMPRESSION: Mildly increased volume of gas within the stomach and small and large bowel without  evidence of obstruction or perforation.   Electronically Signed   By: David  Martinique M.D.   On: 03/04/2015 08:54   Dg Chest Port 1 View  02/22/2015   CLINICAL DATA:  Initial evaluation for acute shortness of breath.  EXAM: PORTABLE CHEST - 1 VIEW  COMPARISON:  Prior study from 02/10/2015.  FINDINGS: Cardiac and mediastinal silhouettes are stable in size and contour, and remain within normal limits.  Lungs are hyperinflated with attenuation of the pulmonary markings, consistent with underlying emphysema. There is parenchymal infiltrate within the right upper lobe, consistent with pneumonia. Additional less severe infiltrate within the left upper lobe as well. No definite infiltrates within the lower lung fields. No pulmonary edema or pleural effusion. No pneumothorax.  No acute osseus abnormality.  IMPRESSION: 1. Multi focal parenchymal infiltrates within the bilateral  upper lobes, right worse than left, consistent with multifocal pneumonia. 2. Emphysema.   Electronically Signed   By: Jeannine Boga M.D.   On: 02/07/2015 21:54    ASSESSMENT / PLAN:  Severe baseline COPD with acute exacerbation HCAP Severe sepsis > improved -Continuous BiPAP for now -NPO until no longer needs BiPAP and encephalopathy improved.  -Wean FiO2 to keep SpO2 88-92% -ABG in 1 hour.  -May need to shave beard for better BiPAP effectiveness.  -Add Brovana, Duoneb to PRN -Continue budesonide -steroids to IV, solumedrol $RemoveBefore'60mg'ZTqdCMBvYFSqV$  q 8 hours -Continue vanc/zosyn -Trend PCT -Repeat CXR in am -Recommend low dose PRN ativan for agitation and hold other sedating meds at this time  Georgann Housekeeper, AGACNP-BC [place] [facility]/Critical Care Pager [phone] or [phone]  Attending Note:  69 year old male with ES-COPD who presents to the hospital with the chief complaint of SOB. RUL PNA noted on exam but also severe hyperinflation consistent with severe emphysema. Patient is wasted, consistent with severe and end stage COPD. ABG that was noted earlier is rather well compensated for a patient COPD but indicative of a person with hypoxemic respiratory failure. His WOB on exam is very labored. I would be contended with an oxygen saturation of 85-88% in a patient with ES-COPD and a PaO2 of 55 is consistent with a saturation of 88%. He should never be placed on 100% but he is a mouth breather. RNs have been given strict orders to maintain sat only of 85-88%. Will place back on BiPAP only for the sake of WOB not to reduce CO2 as his baseline is more likely 60 and he is 51 now to compensate for hypoxemia.   I agree with abx choice use. Would recommend tailoring down as cultures mature.  With regards to his end stage condition. The patient is appropriately DNR. BiPAP should be for comfort only. I would not extend care beyond BiPAP as since if we intubate this patient he is  extremely unlikely to be liberated from the ventilator.   With regards to COPD, I agree with bronchodilator choices as well as steroid use to minimize inflammation in a patient with ES-COPD.  Given WOB I would strongly recommend involving palliative care to manage the patient's symptoms of SOB.  PCCM will sign off, please call back if needed.  The patient is critically ill with multiple organ systems failure and requires high complexity decision making for assessment and support, frequent evaluation and titration of therapies, application of advanced monitoring technologies and extensive interpretation of multiple databases.   Critical Care Time devoted to patient care services described in this note is 35 Minutes. This time reflects time of care  of this signee Dr Jennet Maduro. This critical care time does not reflect procedure time, or teaching time or supervisory time of PA/NP/Med student/Med Resident etc but could involve care discussion time.  Rush Farmer, M.D. [name] Pulmonary/Critical Care Medicine. Pager: [phone]. After hours pager: [phone].  03/05/2015 3:55 PM

## 2015-03-05 NOTE — Progress Notes (Signed)
RT called to patient's room sat 74%. Placede patient on venturi mask at 55%, but sat remained <83%

## 2015-03-05 NOTE — Progress Notes (Signed)
Titrating FI02 from 100% to 60% after patient stabilized.  Sat 98%

## 2015-03-05 NOTE — Progress Notes (Signed)
Chaplain Note:   Chaplain responded to consult for Mr. Sturges.   Mr. Huebert is sleeping.   Nurse also notes that he has altered mental status.   HCPOA cannot be completed.   Delford Field, [place] 03/05/2015 11:07 AM

## 2015-03-06 ENCOUNTER — Inpatient Hospital Stay (HOSPITAL_COMMUNITY): Payer: Medicare Other

## 2015-03-06 DIAGNOSIS — F05 Delirium due to known physiological condition: Secondary | ICD-10-CM | POA: Insufficient documentation

## 2015-03-06 LAB — BASIC METABOLIC PANEL
ANION GAP: 5 (ref 5–15)
BUN: 9 mg/dL (ref 6–20)
CALCIUM: 8.1 mg/dL — AB (ref 8.9–10.3)
CO2: 35 mmol/L — ABNORMAL HIGH (ref 22–32)
Chloride: 99 mmol/L — ABNORMAL LOW (ref 101–111)
Creatinine, Ser: 0.42 mg/dL — ABNORMAL LOW (ref 0.61–1.24)
GFR calc Af Amer: >60 mL/min (ref >60)
GFR calc non Af Amer: >60 mL/min (ref >60)
GLUCOSE: 145 mg/dL — AB (ref 65–99)
Potassium: 4.3 mmol/L (ref 3.5–5.1)
SODIUM: 139 mmol/L (ref 135–145)

## 2015-03-06 LAB — CBC
HCT: 32.7 % — ABNORMAL LOW (ref 39.0–52.0)
Hemoglobin: 10.8 g/dL — ABNORMAL LOW (ref 13.0–17.0)
MCH: 30.8 pg (ref 26.0–34.0)
MCHC: 33 g/dL (ref 30.0–36.0)
MCV: 93.2 fL (ref 78.0–100.0)
Platelets: 218 10*3/uL (ref 150–400)
RBC: 3.51 MIL/uL — ABNORMAL LOW (ref 4.22–5.81)
RDW: 14.8 % (ref 11.5–15.5)
WBC: 19.5 10*3/uL — ABNORMAL HIGH (ref 4.0–10.5)

## 2015-03-06 LAB — BLOOD GAS, ARTERIAL
ACID-BASE EXCESS: 10.5 mmol/L — AB (ref 0.0–2.0)
Bicarbonate: 34.9 mEq/L — ABNORMAL HIGH (ref 20.0–24.0)
Drawn by: 36529
O2 Content: 6 L/min
O2 Saturation: 93.1 %
PATIENT TEMPERATURE: 98.6
TCO2: 36.4 mmol/L (ref 0–100)
pCO2 arterial: 49.6 mmHg — ABNORMAL HIGH (ref 35.0–45.0)
pH, Arterial: 7.461 — ABNORMAL HIGH (ref 7.350–7.450)
pO2, Arterial: 66.4 mmHg — ABNORMAL LOW (ref 80.0–100.0)

## 2015-03-06 LAB — MAGNESIUM: Magnesium: 2.1 mg/dL (ref 1.7–2.4)

## 2015-03-06 NOTE — Progress Notes (Signed)
Family Medicine Teaching Service Daily Progress Note Intern Pager: [phone]  Patient name: Hector House Medical record number: [id number] Date of birth: [birth year] Age: 69 y.o. Gender: male  Primary Care Provider: [name], MD Consultants: palliative  Code Status: DNR  Pt Overview and Major Events to Date:  07/26: Admitted to step down unit  Assessment and Plan:  Acute Respiratory Failure and severe sepsis 2/2 HCAP, also with possible COPD exacerbation: Acute worsening of dyspnea not related to exertion with increased O2 requirement. Admits to productive cough, fever/chills, and left sided chest pain. Meets SIRS criteria on admission with tachypnea, tachycardia, and elevated WBC (21). BP hypotensive to 80D systolic on admission as well. S/p CAT and BiPAP in ED, now on 4L [place] but continuing to be in mild respiratory distress with accessory muscle use. CXR in ED revealed multifocal pneumonia. Initially wheezing per report, now s/p duonebs, solumedrol, and BiPAP, so will treat empirically for COPD exacerbation as well. Will need to rule out cardiac causes of chest pain/SOB. BNP 151 although BNP has been in that range previously. Last Echo in 10/2013 with EF 50-55% and normal diastolic function. No evidence of volume overload on exam with dry MM and no crackles or edema and hypotension on exam. EKG revealed sinus tachycardia with PVCs (nonischemic) and initial troponin negative. ABG with normal pH and mild hypercapnea. - Continue monitoring respiratory status and O2 requirement  - O2 via [place]/mask or BiPAP prn - Continue Vanc/Zosyn for treatment of HCAP - Continue home Pulmicort neb BID - Duoneb q4 scheduled with Duoneb q2 PRN - IVF can re-bolus prn - Prednisone 50mg  for 2 more days  -  IV steriods if cannot tolerate PO - CCM recommendations - Palliative recommendations  Hypotension with h/o hypertension: on ARB-HCTZ at home.Currently hypotensive in setting of sepsis. -Hold home  irbesartan, Lisinopril, and HCTZ -Monitor BP -IVF as above, can re-bolus prn to maintain sBP >90  Hypokalemia: K 2.7>>2.5>>3.3>>4.3 -Resolved  UA with granular casts, protein, urobilinogen, ketones: C/w dehydration in setting of sepsis.  - IVF as above - f/u UA and Urine cx  Anxiety: Mood stable  -Hold home Xanax for now as patient is taking 2 different benzos at home -Monitor for sx of anxiety - Continue Klonopin BID - Continue home buspar  Protein-calorie malnutrition, severe: chronic - Continue home Boost high protein oral nutrition supplements - Nutrition consulted  Tobacco Abuse - Chronic, 2 packs/day for 50 years - Admits to smoking 5 cigarettes/day for the past 2 weeks. - tobacco cessation counseling - nicotine patch upon request  Alcohol Abuse: Patient reported he solves his problems with "[employer]". -CIWA protocol for now  Abdominal Pain:  -Resolved  Delerium: Developed delerium last night, only oriented to person but not place or time. Is pulling out his lines and pulling off tele.  -Monitor ABGs -Continue treatment for sepsis -Monitor vitals closely -Take off any unnecessary PO meds for now (removed multivitamin and folic acid)  End of Life Care -Palliative consulted  FEN/GI:  heart healthy diet Prophylaxis: sub q heparin   Disposition: Status pending improvement of sepsis  Subjective:  Patient is not doing well today, he is having increased work of breathing and delirium. He is not able to answer many of my questions.   Objective: Temp:  [96.8 F (36 C)-97.8 F (36.6 C)] 97.4 F (36.3 C) (07/28 1152) Pulse Rate:  [78-113] 83 (07/28 1200) Resp:  [21-31] 26 (07/28 1200) BP: (94-132)/(52-70) 113/63 mmHg (07/28 1152) SpO2:  [86 %-  99 %] 92 % (07/28 1200) FiO2 (%):  [50 %-55 %] 55 % (07/28 0843) Physical Exam: General: Cachetic male laying in bed Cardiovascular: RRR, No rubs, gallops or murmurs. Normal s1 and s2 Respiratory: increased work of  breathing, using accessory muscles with supraclavicular retractions and subcostal retractions. Crackles and wheezing in bilateral lung fields.  Diminished breath sounds at bases Abdomen: Non tender, soft, normal bowel sounds Extremities: no edema  Laboratory:  Recent Labs Lab 02/10/2015 2114 03/04/15 0704 03/06/15 0232  WBC 21.4* 14.0* 19.5*  HGB 12.6* 10.1* 10.8*  HCT 37.2* 30.8* 32.7*  PLT 236 183 218    Recent Labs Lab 02/26/2015 2114  03/05/15 0240 03/05/15 1035 03/06/15 0232  NA 137  < > 141 139 139  K 2.8*  < > 2.5* 3.3* 4.3  CL 88*  < > 100* 99* 99*  CO2 38*  < > 34* 32 35*  BUN 18  < > 10 8 9   CREATININE 0.73  < > 0.52* 0.49* 0.42*  CALCIUM 8.8*  < > 8.2* 8.2* 8.1*  PROT 5.9*  --   --   --   --   BILITOT 1.8*  --   --   --   --   ALKPHOS 73  --   --   --   --   ALT 11*  --   --   --   --   AST 15  --   --   --   --   GLUCOSE 147*  < > 144* 112* 145*  < > = values in this interval not displayed. ABG    Component Value Date/Time   PHART 7.461* 03/06/2015 1050   PCO2ART 49.6* 03/06/2015 1050   PO2ART 66.4* 03/06/2015 1050   HCO3 34.9* 03/06/2015 1050   TCO2 36.4 03/06/2015 1050   O2SAT 93.1 03/06/2015 1050     Imaging/Diagnostic Tests: No results found.   Carlyle Dolly, MD 03/06/2015, 7:17 AM PGY-1, [place] Intern pager: [phone], text pages welcome

## 2015-03-06 NOTE — Progress Notes (Signed)
PT Cancellation Note  Patient Details Name: Hector House MRN: [id number] DOB: 1945/10/17   Cancelled Treatment:    Reason Eval/Treat Not Completed: Patient not medically ready.  Will attempt to see when appropriate. 03/06/2015  Donnella Sham, [place] [phone]  (pager)   [name], Tessie Fass 03/06/2015, 2:52 PM

## 2015-03-06 NOTE — Progress Notes (Signed)
FPTS Interim Progress Note  S: Patient is on the non-re breather at this time with an O2 sat of 96%. According to the nurse his O2 was dipping into the 70s when he was on 4L [place]. His mental status is once again altered. It seems as though he has episodes of transient AMS, especially when he is tired. Patient was oriented to self only and said he was saying nonsensical things such as "I'm sorry I lost your book". He did, however, remember his dog named Tyce and we spoke a little about him.   O: BP 112/64 mmHg  Pulse 86  Temp(Src) 97.3 F (36.3 C) (Oral)  Resp 19  Ht 5\' 8"  (1.727 m)  Wt 97 lb 10.6 oz (44.3 kg)  BMI 14.85 kg/m2  SpO2 96%  General: Cachetic male laying in bed. In NAD Cardiovascular: RRR, No rubs, gallops or murmurs. Normal s1 and s2 Respiratory: mid to moderate increased work of breathing, using accessory muscles with supraclavicular retractions but no subcostal retractions. Crackles and wheezing in bilateral lung fields. Diminished breath sounds at bases Abdomen: Non tender, soft, normal bowel sounds Extremities: no edema   A/P: Acute Respiratory Failure and severe sepsis 2/2 HCAP, also with possible COPD exacerbation: Acute worsening of dyspnea not related to exertion with increased O2 requirement. Admits to productive cough, fever/chills, and left sided chest pain. Meets SIRS criteria on admission with tachypnea, tachycardia, and elevated WBC (21). BP hypotensive to 95A systolic on admission as well. S/p CAT and BiPAP in ED, now on 4L [place] but continuing to be in mild respiratory distress with accessory muscle use. CXR in ED revealed multifocal pneumonia. Initially wheezing per report, now s/p duonebs, solumedrol, and BiPAP, so will treat empirically for COPD exacerbation as well. Will need to rule out cardiac causes of chest pain/SOB. BNP 151 although BNP has been in that range previously. Last Echo in 10/2013 with EF 50-55% and normal diastolic function. No evidence of volume  overload on exam with dry MM and no crackles or edema and hypotension on exam. EKG revealed sinus tachycardia with PVCs (nonischemic) and initial troponin negative. ABG with normal pH and mild hypercapnea. - Continue monitoring respiratory status and O2 requirement  - O2 via [place]/mask or BiPAP prn - Continue Vanc/Zosyn for treatment of HCAP - Continue home Pulmicort neb BID - Duoneb q4 scheduled with Duoneb q2 PRN - IVF can re-bolus prn - Prednisone 50mg  for 2 more days  - IV steriods if cannot tolerate PO - CCM recommendations - Palliative recommendations -Frequent respiratory exams  Hypotension with h/o hypertension: on ARB-HCTZ at home.Currently hypotensive in setting of sepsis. -Hold home irbesartan, Lisinopril, and HCTZ -Monitor BP -IVF as above, can re-bolus prn to maintain sBP >90  Anxiety: Mood stable  -Hold home Xanax for now as patient is taking 2 different benzos at home -Monitor for sx of anxiety - Continue Klonopin BID - Continue home buspar  Protein-calorie malnutrition, severe: chronic - Continue home Boost high protein oral nutrition supplements - Nutrition consulted  Tobacco Abuse - Chronic, 2 packs/day for 50 years - Admits to smoking 5 cigarettes/day for the past 2 weeks. - tobacco cessation counseling - nicotine patch upon request  Alcohol Abuse: Patient reported he solves his problems with "[employer]". -CIWA protocol for now  Delerium: Developed delerium last night, only oriented to person but not place or time. Is pulling out his lines and pulling off tele.  -Monitor ABGs -Continue treatment for sepsis -Monitor vitals closely -Take off  any unnecessary PO meds for now (removed multivitamin and folic acid)  End of Life Care -Palliative consulted  Carlyle Dolly, MD 03/06/2015, 7:20 PM PGY-1, [place] Medicine Service pager [phone]

## 2015-03-06 NOTE — Progress Notes (Signed)
OT Cancellation Note  Patient Details Name: Hector House MRN: [id number] DOB: Oct 07, 1945   Cancelled Treatment:    Reason Eval/Treat Not Completed: Medical issues which prohibited therapy.  Will reattempt.   Darlina Rumpf [place], OTR/L [phone]  03/06/2015, 2:41 PM

## 2015-03-06 NOTE — Clinical Social Work Placement (Signed)
   CLINICAL SOCIAL WORK PLACEMENT  NOTE  Date:  03/06/2015  Patient Details  Name: Hector House MRN: [id number] Date of Birth: [birth year]  Clinical Social Work is seeking post-discharge placement for this patient at the [place] level of care (*CSW will initial, date and re-position this form in  chart as items are completed):  Yes   Patient/family provided with [place] Work Department's list of facilities offering this level of care within the geographic area requested by the patient (or if unable, by the patient's family).  Yes   Patient/family informed of their freedom to choose among providers that offer the needed level of care, that participate in Medicare, Medicaid or managed care program needed by the patient, have an available bed and are willing to accept the patient.  Yes   Patient/family informed of [place]'s ownership interest in [facility] and [facility], as well as of the fact that they are under no obligation to receive care at these facilities.  PASRR submitted to EDS on       PASRR number received on       Existing PASRR number confirmed on 03/06/15     FL2 transmitted to all facilities in geographic area requested by pt/family on 03/06/15     FL2 transmitted to all facilities within larger geographic area on       Patient informed that his/her managed care company has contracts with or will negotiate with certain facilities, including the following:        Yes   Patient/family informed of bed offers received.  Patient chooses bed at [place]     Physician recommends and patient chooses bed at      Patient to be transferred [facility] on  .  Patient to be transferred to facility by       Patient family notified on   of transfer.  Name of family member notified:        PHYSICIAN       Additional Comment:     _______________________________________________ Greta Doom, LCSW 03/06/2015, 9:02 AM

## 2015-03-06 NOTE — Telephone Encounter (Signed)
Spoke with Hector House, needs new mouthpieces and tubing for nebulizer. States the machine is working but he has lots parts of his neb tubing.    States that pt is currently admitted to the hospital but would like this to be ready for him when he gets home. Pt uses [facility] for his nebulizer.    BQ are you ok with this order?  Thanks!

## 2015-03-06 NOTE — Clinical Social Work Note (Signed)
Clinical Social Work Assessment  Patient Details  Name: Hector House MRN: [id number] Date of Birth: 09-22-1945  Date of referral:  03/06/15               Reason for consult:  Facility Placement              Housing/Transportation Living arrangements for the past 2 months:  Apartment Source of Information:  Other (Comment Required) Hector House, sister ) Patient Interpreter Needed:  None Criminal Activity/Legal Involvement Pertinent to Current Situation/Hospitalization:  No - Comment as needed Significant Relationships:  Siblings, Adult Children Lives with:  Self Do you feel safe going back to the place where you live?  No Need for family participation in patient care:  No (Coment)  Care giving concerns:  N/A   Facilities manager / plan:  CSW spoke with the pt's sister Hector House. CSW introduced self and purpose of the call. CSW and Hector House discussed pt's discharge disposition. CSW explained the SNF process. CSW explained insurance and its relation to SNF placement. Hector House reported that if the pt goes to a SNF she prefer [facility]. Hector House reported that she will be leaving out of town to [place] to visit her granddaughter. CSW answered all questions in which Hector House inquired about.  CSW informed CSW of the pt's son Hector House phone number [phone]. CSW call the ER registration to add Hector House's contact information to the facesheet. CSW will continue to follow this pt and assist with discharge as needed.   Employment status:  Disabled (Comment on whether or not currently receiving Disability) Insurance information:   Medicare  PT Recommendations:  [place] / Referral to community resources:  [place]  Patient/Family's Response to care: Hector House reported that she did not know the pt was in the hospital.   Patient/Family's Understanding of and Emotional Response to Diagnosis, Current Treatment, and Prognosis:  N/A  Emotional  Assessment Appearance:   (Unable to Assess ) Attitude/Demeanor/Rapport:  Unable to Assess Affect (typically observed):  Unable to Assess Orientation:  Oriented to Self Alcohol / Substance use:  Not Applicable Psych involvement (Current and /or in the community):  No (Comment)  Discharge Needs  Concerns to be addressed:  Denies Needs/Concerns at this time Readmission within the last 30 days:  Yes Current discharge risk:  None Barriers to Discharge:  No Barriers Identified   Mohd Clemons, LCSW 03/06/2015, 8:55 AM

## 2015-03-06 NOTE — Telephone Encounter (Signed)
Yes please order

## 2015-03-06 NOTE — Telephone Encounter (Signed)
[phone], cathy cb

## 2015-03-06 NOTE — Consult Note (Signed)
ANTIBIOTIC CONSULT NOTE - Follow-up  Pharmacy Consult for Vancomycin and Zosyn Indication: HCAP  No Known Allergies  Patient Measurements: Weight 43.1kg Height 5'7''  Vital Signs: Temp: 97.4 F (36.3 C) (07/28 1152) Temp Source: Oral (07/28 1152) BP: 113/63 mmHg (07/28 1152) Pulse Rate: 83 (07/28 1200) Intake/Output from previous day: 07/27 0701 - 07/28 0700 In: 300 [IV Piggyback:300] Out: 575 [Urine:575] Intake/Output from this shift: Total I/O In: -  Out: 200 [Urine:200]  Labs:  Recent Labs  02/24/2015 2114 03/04/15 0704  03/05/15 0240 03/05/15 1035 03/06/15 0232  WBC 21.4* 14.0*  --   --   --  19.5*  HGB 12.6* 10.1*  --   --   --  10.8*  PLT 236 183  --   --   --  218  CREATININE 0.73 0.56*  < > 0.52* 0.49* 0.42*  < > = values in this interval not displayed. Estimated Creatinine Clearance: 56.1 mL/min (by C-G formula based on Cr of 0.42).  Microbiology: Recent Results (from the past 720 hour(s))  Blood culture (routine x 2)     Status: None   Collection Time: 02/10/15 11:17 PM  Result Value Ref Range Status   Specimen Description LEFT ANTECUBITAL  Final   Special Requests BOTTLES DRAWN AEROBIC AND ANAEROBIC 10CC  Final   Culture NO GROWTH 5 DAYS  Final   Report Status 02/15/2015 FINAL  Final  Blood culture (routine x 2)     Status: None   Collection Time: 02/10/15 11:24 PM  Result Value Ref Range Status   Specimen Description BLOOD LEFT HAND  Final   Special Requests BOTTLES DRAWN AEROBIC ONLY 10CC  Final   Culture NO GROWTH 5 DAYS  Final   Report Status 02/15/2015 FINAL  Final  MRSA PCR Screening     Status: None   Collection Time: 02/11/15  1:44 AM  Result Value Ref Range Status   MRSA by PCR NEGATIVE NEGATIVE Final    Comment:        The GeneXpert MRSA Assay (FDA approved for NASAL specimens only), is one component of a comprehensive MRSA colonization surveillance program. It is not intended to diagnose MRSA infection nor to guide or monitor  treatment for MRSA infections.   Blood Culture (routine x 2)     Status: None (Preliminary result)   Collection Time: 02/07/2015 10:35 PM  Result Value Ref Range Status   Specimen Description BLOOD RIGHT ARM  Final   Special Requests BOTTLES DRAWN AEROBIC AND ANAEROBIC 10CC  Final   Culture NO GROWTH 2 DAYS  Final   Report Status PENDING  Incomplete  Blood Culture (routine x 2)     Status: None (Preliminary result)   Collection Time: 02/07/2015 10:40 PM  Result Value Ref Range Status   Specimen Description BLOOD RIGHT HAND  Final   Special Requests BOTTLES DRAWN AEROBIC ONLY [place]  Final   Culture NO GROWTH 2 DAYS  Final   Report Status PENDING  Incomplete  Urine culture     Status: None   Collection Time: 02/16/2015 11:26 PM  Result Value Ref Range Status   Specimen Description URINE, CLEAN CATCH  Final   Special Requests NONE  Final   Culture NO GROWTH 1 DAY  Final   Report Status 03/05/2015 FINAL  Final   Assessment: 40yom recently discharged from [facility] (02/15/15) after admission for COPD exacerbation returns to the ED with worsening SOB and productive cough. CXR shows multifocal bilateral parenchymal infiltrates consistent with multifocal  pneumonia. He was started on vancomycin + zosyn. Pt is afebrile and WBC is elevated at 19.4 (also on steroids). SCr is stable and doses remain appropriate.   7/25 Urine cx > NGTD 7/25 Blood cx > NGTD  7/25 Vanc>> 7/25 Zosyn>>  Goal of Therapy:  Vancomycin trough level 15-20 mcg/ml  Plan:  - Continue vanc 1gm IV Q24H - Continue zosyn 3.375gm IV Q8H (4 hr inf) - F/u renal fxn, C&S, clinical status and trough at [facility] - Consider de-escalation as soon as able  Salome Arnt, PharmD, BCPS Pager # [phone] 03/06/2015 1:28 PM

## 2015-03-06 NOTE — Telephone Encounter (Signed)
LMTCB x 1 for [employer] (EC)

## 2015-03-06 NOTE — Care Management Important Message (Signed)
Important Message  Patient Details  Name: Hector House MRN: [id number] Date of Birth: Dec 21, 1945   Medicare Important Message Given:  Yes-second notification given    Nathen May 03/06/2015, 12:57 [place] Message  Patient Details  Name: Hector House MRN: [id number] Date of Birth: [birth year]   Medicare Important Message Given:  Yes-second notification given    Nathen May 03/06/2015, 12:57 PM

## 2015-03-07 DIAGNOSIS — J9601 Acute respiratory failure with hypoxia: Secondary | ICD-10-CM

## 2015-03-07 DIAGNOSIS — J439 Emphysema, unspecified: Secondary | ICD-10-CM

## 2015-03-07 DIAGNOSIS — Z515 Encounter for palliative care: Secondary | ICD-10-CM

## 2015-03-07 DIAGNOSIS — F05 Delirium due to known physiological condition: Secondary | ICD-10-CM

## 2015-03-07 DIAGNOSIS — F418 Other specified anxiety disorders: Secondary | ICD-10-CM

## 2015-03-07 LAB — CBC
HCT: 37.5 % — ABNORMAL LOW (ref 39.0–52.0)
HEMOGLOBIN: 12.4 g/dL — AB (ref 13.0–17.0)
MCH: 30.7 pg (ref 26.0–34.0)
MCHC: 33.1 g/dL (ref 30.0–36.0)
MCV: 92.8 fL (ref 78.0–100.0)
Platelets: 206 10*3/uL (ref 150–400)
RBC: 4.04 MIL/uL — ABNORMAL LOW (ref 4.22–5.81)
RDW: 14.8 % (ref 11.5–15.5)
WBC: 34 10*3/uL — ABNORMAL HIGH (ref 4.0–10.5)

## 2015-03-07 LAB — BASIC METABOLIC PANEL
Anion gap: 8 (ref 5–15)
BUN: 14 mg/dL (ref 6–20)
CO2: 37 mmol/L — AB (ref 22–32)
Calcium: 8.1 mg/dL — ABNORMAL LOW (ref 8.9–10.3)
Chloride: 97 mmol/L — ABNORMAL LOW (ref 101–111)
Creatinine, Ser: 0.57 mg/dL — ABNORMAL LOW (ref 0.61–1.24)
GFR calc Af Amer: >60 mL/min (ref >60)
GFR calc non Af Amer: >60 mL/min (ref >60)
GLUCOSE: 138 mg/dL — AB (ref 65–99)
Potassium: 3.5 mmol/L (ref 3.5–5.1)
Sodium: 142 mmol/L (ref 135–145)

## 2015-03-07 LAB — BLOOD GAS, ARTERIAL
ACID-BASE EXCESS: 12.6 mmol/L — AB (ref 0.0–2.0)
Bicarbonate: 37.2 mEq/L — ABNORMAL HIGH (ref 20.0–24.0)
Delivery systems: POSITIVE
Drawn by: 275531
EXPIRATORY PAP: 6
FIO2: 0.6
Inspiratory PAP: 12
O2 Saturation: 89.6 %
PH ART: 7.464 — AB (ref 7.350–7.450)
Patient temperature: 98.6
TCO2: 38.8 mmol/L (ref 0–100)
pCO2 arterial: 52.5 mmHg — ABNORMAL HIGH (ref 35.0–45.0)
pO2, Arterial: 60.4 mmHg — ABNORMAL LOW (ref 80.0–100.0)

## 2015-03-07 MED ORDER — ARFORMOTEROL TARTRATE 15 MCG/2ML IN NEBU
15.0000 ug | INHALATION_SOLUTION | Freq: Two times a day (BID) | RESPIRATORY_TRACT | Status: DC | PRN
  Filled 2015-03-07: qty 2

## 2015-03-07 MED ORDER — MORPHINE SULFATE 2 MG/ML IJ SOLN
1.0000 mg | INTRAMUSCULAR | Status: DC | PRN
  Administered 2015-03-07 – 2015-03-08 (×4): 1 mg via INTRAVENOUS
  Filled 2015-03-07 (×4): qty 1

## 2015-03-07 MED ORDER — POLYVINYL ALCOHOL 1.4 % OP SOLN
1.0000 [drp] | Freq: Four times a day (QID) | OPHTHALMIC | Status: DC | PRN
  Filled 2015-03-07: qty 15

## 2015-03-07 MED ORDER — BUDESONIDE 0.5 MG/2ML IN SUSP
0.5000 mg | Freq: Two times a day (BID) | RESPIRATORY_TRACT | Status: DC | PRN
  Filled 2015-03-07: qty 2

## 2015-03-07 MED ORDER — HALOPERIDOL LACTATE 2 MG/ML PO CONC
0.5000 mg | ORAL | Status: DC | PRN
  Filled 2015-03-07 (×2): qty 0.3

## 2015-03-07 MED ORDER — LORAZEPAM 2 MG/ML IJ SOLN
1.0000 mg | INTRAMUSCULAR | Status: DC | PRN
  Administered 2015-03-07 – 2015-03-08 (×2): 1 mg via INTRAVENOUS
  Filled 2015-03-07 (×2): qty 1

## 2015-03-07 MED ORDER — BISACODYL 10 MG RE SUPP: 10.0000 mg | Freq: Every day | RECTAL | Status: DC | PRN

## 2015-03-07 MED ORDER — ONDANSETRON 4 MG PO TBDP
4.0000 mg | ORAL_TABLET | Freq: Four times a day (QID) | ORAL | Status: DC | PRN
  Filled 2015-03-07: qty 1

## 2015-03-07 MED ORDER — HALOPERIDOL 0.5 MG PO TABS
0.5000 mg | ORAL_TABLET | ORAL | Status: DC | PRN
  Filled 2015-03-07 (×2): qty 1

## 2015-03-07 MED ORDER — HALOPERIDOL LACTATE 5 MG/ML IJ SOLN: 0.5000 mg | INTRAMUSCULAR | Status: DC | PRN

## 2015-03-07 MED ORDER — LORAZEPAM 2 MG/ML PO CONC: 1.0000 mg | ORAL | Status: DC | PRN

## 2015-03-07 MED ORDER — ONDANSETRON HCL 4 MG/2ML IJ SOLN: 4.0000 mg | Freq: Four times a day (QID) | INTRAMUSCULAR | Status: DC | PRN

## 2015-03-07 MED ORDER — SODIUM CHLORIDE 0.9 % IV SOLN
INTRAVENOUS | Status: DC
Start: 2015-03-07 — End: 2015-03-07

## 2015-03-07 MED ORDER — ATROPINE SULFATE 1 % OP SOLN
4.0000 [drp] | OPHTHALMIC | Status: DC | PRN
  Filled 2015-03-07: qty 2

## 2015-03-07 MED ORDER — DEXTROSE-NACL 5-0.45 % IV SOLN
INTRAVENOUS | Status: DC
  Administered 2015-03-07: 12:00:00 via INTRAVENOUS

## 2015-03-07 MED ORDER — LORAZEPAM 1 MG PO TABS: 1.0000 mg | ORAL_TABLET | ORAL | Status: DC | PRN

## 2015-03-07 NOTE — Progress Notes (Signed)
Daily Progress Note   Patient Name: Hector House       Date: 03/07/2015 DOB: Dec 13, 1945  Age: 69 y.o. MRN#: [id number] Attending Physician: Alveda Reasons, MD Primary Care Physician: [name], MD Admit Date: 02/07/2015  Reason for Consultation/Follow-up: Establishing goals of care Life limiting illness: Acute Respiratory Failure and severe sepsis 2/2 HCAP, also with possible COPD exacerbation: Subjective:  patient on BiPaP, discussed with bedside RN: patient only able to come off BiPaP to be on VM to take his medications. Confused.   PLAN:  Family meeting scheduled with son Ronalee Belts at [phone] on 03-07-15 at 1400,who states that he is the [facility] agent for Mr Pavlik. The patient doesn't have a spouse, Ronalee Belts is the only son. Ronalee Belts does not believe that patient's sister [name] has decision making capacity for the patient. Ronalee Belts states he was not aware that the patient is hospitalized.   Further recommendations will follow after family meeting.    Length of Stay: 4 days  Current Medications: Scheduled Meds:  . arformoterol  15 mcg Nebulization BID  . aspirin EC  81 mg Oral Daily  . budesonide  0.5 mg Nebulization BID  . busPIRone  10 mg Oral BID  . clonazePAM  0.5 mg Oral BID  . feeding supplement  1 Container Oral BID BM  . feeding supplement (ENSURE ENLIVE)  237 mL Oral TID BM  . heparin  5,000 Units Subcutaneous 3 times per day  . methylPREDNISolone (SOLU-MEDROL) injection  60 mg Intravenous 3 times per day  . mirtazapine  7.5 mg Oral QHS  . piperacillin-tazobactam (ZOSYN)  IV  3.375 g Intravenous 3 times per day  . polyethylene glycol  17 g Oral Daily  . thiamine  100 mg Oral Daily   Or  . thiamine  100 mg Intravenous Daily  . vancomycin  1,000 mg Intravenous Q24H    Continuous Infusions: . dextrose 5 % and 0.45% NaCl      PRN Meds: acetaminophen **OR** acetaminophen, ipratropium-albuterol  Palliative Performance Scale: 30%     Vital Signs: BP 135/68 mmHg  Pulse  106  Temp(Src) 97.9 F (36.6 C) (Axillary)  Resp 32  Ht 5\' 8"  (1.727 m)  Wt 44.3 kg (97 lb 10.6 oz)  BMI 14.85 kg/m2  SpO2 94% SpO2: SpO2: 94 % O2 Device: O2 Device: Bi-PAP O2 Flow Rate: O2 Flow Rate (L/min): 14 L/min  Intake/output summary:  Intake/Output Summary (Last 24 hours) at 03/07/15 1152 Last data filed at 03/07/15 0524  Gross per 24 hour  Intake    150 ml  Output    300 ml  Net   -150 ml   LBM:   Baseline Weight: Weight: 48.988 kg (108 lb) Most recent weight: Weight: 44.3 kg (97 lb 10.6 oz)  Physical Exam:  weak frail elderly appearing gentleman mild distress on BiPaP Coarse rhonchi S1S2 Abdomen soft No edema confused     Additional Data Reviewed: Recent Labs     03/05/15  1035  03/06/15  0232  WBC   --   19.5*  HGB   --   10.8*  PLT   --   218  NA  139  139  BUN  8  9  CREATININE  0.49*  0.42*    Problem List:  Patient Active Problem List   Diagnosis Date Noted  . Delirium due to medical condition without behavioral disturbance   . Acute respiratory failure with hypoxia   . SOB (shortness of  breath)   . Acute respiratory failure 03/04/2015  . Abdominal pain   . HCAP (healthcare-associated pneumonia) 02/22/2015  . Constipation 02/25/2015  . COPD exacerbation 02/11/2015  . Pressure ulcer 02/11/2015  . Acute respiratory failure with hypercapnia   . Underweight 06/24/2014  . Protein-calorie malnutrition, severe 11/02/2013  . Anxiety disorder due to medical condition 11/01/2013  . 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 History of sarcoma of soft tissue 09/20/2011  . 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 Alcohol use 10/22/2009  . HYPERTENSION, BENIGN ESSENTIAL 09/25/2009  . COPD (chronic obstructive pulmonary disease) with emphysema 09/25/2009  . LOW BACK PAIN SYNDROME 09/25/2009     Palliative Care Assessment & Plan    Code Status:  DNR  Goals of Care:   goals of care family meeting today at 03-07-15 at 56  Desire for further  Chaplaincy support:no  3. Symptom Management:   will add low dose morphine IV PRN  4. Palliative Prophylaxis:  Stool Softener: yes, patient on Miralax  5. Prognosis: weeks  5. Discharge Planning: pending further discussions with son   Care plan was discussed with  RN, case management, patient and son  Thank you for allowing the Palliative Medicine Team to assist in the care of this patient.   Time In: 1100 Time Out: 1135 Total Time 35 Prolonged Time Billed  no     Greater than 50%  of this time was spent counseling and coordinating care related to the above assessment and plan.  [place], MD  03/07/2015, 11:52 AM  Please contact Palliative Medicine Team phone at [phone] for questions and concerns.

## 2015-03-07 NOTE — Progress Notes (Signed)
Family Medicine Teaching Service Daily Progress Note Intern Pager: [phone]  Patient name: Hector House Medical record number: [id number] Date of birth: 05/24/1946 Age: 69 y.o. Gender: male  Primary Care Provider: [name], MD Consultants: palliative, CCM  Code Status: DNR  Pt Overview and Major Events to Date:  07/26: Admitted to step down unit  Assessment and Plan:  Acute Respiratory Failure and severe sepsis 2/2 HCAP, also with possible COPD exacerbation: Acute worsening of dyspnea not related to exertion with increased O2 requirement. Admits to productive cough, fever/chills, and left sided chest pain. Meets SIRS criteria on admission with tachypnea, tachycardia, and elevated WBC (21). BP hypotensive to 69G systolic on admission as well. S/p CAT and BiPAP in ED, now on 4L [place] but continuing to be in mild respiratory distress with accessory muscle use. CXR in ED revealed multifocal pneumonia. Initially wheezing per report, now s/p duonebs, solumedrol, and BiPAP, so will treat empirically for COPD exacerbation as well. Will need to rule out cardiac causes of chest pain/SOB. BNP 151 although BNP has been in that range previously. Last Echo in 10/2013 with EF 50-55% and normal diastolic function. No evidence of volume overload on exam with dry MM and no crackles or edema and hypotension on exam. EKG revealed sinus tachycardia with PVCs (nonischemic) and initial troponin negative. ABG with normal pH and mild hypercapnea. - Continue monitoring respiratory status and O2 requirement  - O2 via [place]/mask or BiPAP prn - Continue Vanc/Zosyn for treatment of HCAP - Continue home Pulmicort neb BID - Duoneb q4 scheduled with Duoneb q2 PRN - IVF can re-bolus prn - Prednisone 50mg  for 2 more days  -  IV steriods if cannot tolerate PO - CCM recommendations - Palliative recommendations -Frequent respiratory exams  Hypotension with h/o hypertension: on ARB-HCTZ at home.Currently hypotensive in setting  of sepsis. -Hold home irbesartan, Lisinopril, and HCTZ -Monitor BP -IVF as above, can re-bolus prn to maintain sBP >90  Hypokalemia: K 2.7>>2.5>>3.3>>4.3 -Resolved  UA with granular casts, protein, urobilinogen, ketones: C/w dehydration in setting of sepsis.  - IVF as above - f/u UA and Urine cx  Anxiety: Mood stable  -Hold home Xanax for now as patient is taking 2 different benzos at home -Monitor for sx of anxiety - Continue Klonopin BID - Continue home buspar  Protein-calorie malnutrition, severe: chronic - Continue home Boost high protein oral nutrition supplements - Nutrition consulted  Tobacco Abuse - Chronic, 2 packs/day for 50 years - Admits to smoking 5 cigarettes/day for the past 2 weeks. - tobacco cessation counseling - nicotine patch upon request  Alcohol Abuse: Patient reported he solves his problems with "[employer]". -CIWA protocol for now  Delerium: Waxing and waning. Patient tends to pull out his lines and pulling off tele.  -Monitor ABGs -Continue treatment for sepsis -Monitor vitals closely  -Take off any unnecessary PO meds for now (removed multivitamin and folic acid)  End of Life Care -Palliative consulted  FEN/GI:  heart healthy diet Prophylaxis: sub q heparin   Disposition: Status pending improvement of sepsis  Subjective:  Patient is not doing well today, he is having increased work of breathing and delirium. He is not able to answer many of my questions. He is not even oriented to person this morning.   Objective: Temp:  [97.3 F (36.3 C)-97.9 F (36.6 C)] 97.9 F (36.6 C) (07/29 0656) Pulse Rate:  [83-99] 99 (07/29 0656) Resp:  [19-32] 32 (07/29 0656) BP: (102-154)/(63-82) 154/79 mmHg (07/29 0656) SpO2:  [  89 %-99 %] 93 % (07/29 0656) FiO2 (%):  [55 %-60 %] 60 % (07/29 0656) Physical Exam: General: Cachetic male laying in bed, alert but not oriented. In moderate respiratory distress Cardiovascular: RRR, No rubs, gallops or murmurs.  Normal s1 and s2 Respiratory: increased work of breathing, using accessory muscles with supraclavicular retractions and subcostal retractions. Crackles and wheezing in bilateral lung fields.  Diminished breath sounds at bases Abdomen: Non tender, soft, normal bowel sounds Extremities: no edema  Laboratory:  Recent Labs Lab 02/24/2015 2114 03/04/15 0704 03/06/15 0232  WBC 21.4* 14.0* 19.5*  HGB 12.6* 10.1* 10.8*  HCT 37.2* 30.8* 32.7*  PLT 236 183 218    Recent Labs Lab 03/01/2015 2114  03/05/15 0240 03/05/15 1035 03/06/15 0232  NA 137  < > 141 139 139  K 2.8*  < > 2.5* 3.3* 4.3  CL 88*  < > 100* 99* 99*  CO2 38*  < > 34* 32 35*  BUN 18  < > 10 8 9   CREATININE 0.73  < > 0.52* 0.49* 0.42*  CALCIUM 8.8*  < > 8.2* 8.2* 8.1*  PROT 5.9*  --   --   --   --   BILITOT 1.8*  --   --   --   --   ALKPHOS 73  --   --   --   --   ALT 11*  --   --   --   --   AST 15  --   --   --   --   GLUCOSE 147*  < > 144* 112* 145*  < > = values in this interval not displayed. ABG    Component Value Date/Time   PHART 7.461* 03/06/2015 1050   PCO2ART 49.6* 03/06/2015 1050   PO2ART 66.4* 03/06/2015 1050   HCO3 34.9* 03/06/2015 1050   TCO2 36.4 03/06/2015 1050   O2SAT 93.1 03/06/2015 1050     Imaging/Diagnostic Tests: Dg Chest Port 1 View  03/06/2015   CLINICAL DATA:  History of pneumonia, followup  EXAM: PORTABLE CHEST - 1 VIEW  COMPARISON:  Portable chest x-ray of 725 and 02/10/2015  FINDINGS: There is persistent airspace disease within the right upper lobe most consistent with pneumonia. There is now more airspace disease within the left upper lobe also consistent with pneumonia with a possible area of cavitation in the left upper lobe opacity. The lungs remain hyperaerated consistent with emphysema. No pleural effusion is seen. Heart size is stable.  IMPRESSION: 1. Stable airspace disease in the right upper lobe with increasing airspace disease in the left upper lobe consistent with worsening of  pneumonia. 2. Questionable cavitation within the opacity within the left upper lobe.   Electronically Signed   By: Ivar Drape M.D.   On: 03/06/2015 08:07     Carlyle Dolly, MD 03/07/2015, 7:26 AM PGY-1, [place] Intern pager: [phone], text pages welcome

## 2015-03-07 NOTE — Progress Notes (Signed)
[place] spoke with son and daughter-in-law briefly; offered support and relayed information from previous conversation with pt.

## 2015-03-07 NOTE — Clinical Social Work Note (Signed)
CSW spoke with the pt's sister [name]. [name] informed the CSW that she misspoke about which SNF she would like the pt to transition to. [name] confirmed that she would like the pt transition to [facility], because it is closer to the hospital. CSW contacted [place] at  [facility] to confirm if they can offer the pt at bed. Gaspar Bidding reported that they can accept the pt. CSW will continue to provided support and assist with discharges needs.   [place], MSW, [place]

## 2015-03-07 NOTE — Telephone Encounter (Signed)
lmtcb for [employer]. Order placed.

## 2015-03-07 NOTE — Progress Notes (Signed)
Family meeting:  Met with the patient's son Ronalee Belts and his daughter in law in the room. The patient's in respiratory distress, using accessory muscles of respiration and confused. Discussed the patient's current condition. Patient with significant decline, even since the previous hospitalization earlier this month. Discussed patient's high likelihood of dying and the patient's high likelihood of not surviving this hospitalization. Prognosis hours to days discussed.   PLAN: DNR DNI Comfort measures only Comfort care order set completed D/C antibiotics.  Hospice consult for inpatient hospice placement.   Dr Juanito Doom and social work notified.   Time in 1400 Time out 1445 45 minutes spent Loistine Chance MD [place] Team [phone]

## 2015-03-07 NOTE — Progress Notes (Signed)
OT Cancellation Note  Patient Details Name: Hector House MRN: [id number] DOB: 24-Jan-1946   Cancelled Treatment:    Reason Eval/Treat Not Completed: Medical issues which prohibited therapy  Darlina Rumpf [place], OTR/L [phone]  03/07/2015, 2:03 PM

## 2015-03-07 NOTE — Progress Notes (Signed)
PT Cancellation Note  Patient Details Name: Hector House MRN: [id number] DOB: 12/31/1945   Cancelled Treatment:    Reason Eval/Treat Not Completed: Patient not medically ready.  On Bipap, not saturating well. 03/07/2015  Donnella Sham, [place] [phone]  (pager)   Zelena Bushong, Tessie Fass 03/07/2015, 2:03 PM

## 2015-03-08 MED ORDER — MORPHINE SULFATE 2 MG/ML IJ SOLN: 2.0000 mg | INTRAMUSCULAR | Status: DC | PRN

## 2015-03-08 MED ORDER — SODIUM CHLORIDE 0.9 % IV SOLN: 5.0000 mg/h | INTRAVENOUS | Status: DC

## 2015-03-08 MED ORDER — SODIUM CHLORIDE 0.9 % IV SOLN
2.0000 mg/h | INTRAVENOUS | Status: DC
  Administered 2015-03-08: 1 mg/h via INTRAVENOUS
  Filled 2015-03-08: qty 10

## 2015-03-09 LAB — CULTURE, BLOOD (ROUTINE X 2)
Culture: NO GROWTH
Culture: NO GROWTH

## 2015-03-10 ENCOUNTER — Ambulatory Visit: Payer: Medicare Other | Admitting: Pharmacist

## 2015-03-10 ENCOUNTER — Encounter: Payer: Self-pay | Admitting: Pharmacist

## 2015-03-10 ENCOUNTER — Ambulatory Visit: Payer: Medicare Other | Admitting: Licensed Clinical Social Worker

## 2015-03-10 ENCOUNTER — Encounter: Payer: Self-pay | Admitting: Licensed Clinical Social Worker

## 2015-03-10 ENCOUNTER — Telehealth: Payer: Self-pay | Admitting: Pulmonary Disease

## 2015-03-10 NOTE — Telephone Encounter (Signed)
atc [place].  Upon entering chart I saw that pt has passed since the initiation of this telephone encounter.  Nothing further needed at this time.

## 2015-03-10 NOTE — Progress Notes (Signed)
   03-19-2015 2000  Clinical Encounter Type  Visited With Patient and family together  Visit Type Follow-up;Death  Referral From Nurse  Spiritual Encounters  Spiritual Needs Grief support  Stress Factors  Patient Stress Factors Loss   Chaplain was paged to patient's room at 7:45 PM and notified that the patient had passed away. When chaplain arrived, several members were both outside the patient's room and at bedside. Patient's family was grieving but seemed to be supporting each other well and were mostly anxious about the logistics of what to do now. Chaplain encouraged patient's family to grieve at bedside for as long as they needed. Chaplain mostly spoke with the patient's son who seemed to be having a hard time. Patient's son said the patient had been suffering for some time now and that he was comforted by the fact the patient is no longer suffering. Patient's son was also confident in the spiritual life of the patient which he knows continues on. Chaplain will continue to provide grief support as needed.  Gar Ponto, Chaplain  8:09 PM

## 2015-03-10 NOTE — Patient Outreach (Signed)
[facility]) Care Management  [place]  03/10/2015   Hector House 12/14/1945 [id number]  Subjective:   Objective:   Current Medications:  Current Outpatient Prescriptions  Medication Sig Dispense Refill  . albuterol (PROAIR HFA) 108 (90 BASE) MCG/ACT inhaler INHALE 2 PUFFS BY MOUTH EVERY 4 TO 6 HOURS AS NEEDED 18 g 2  . ALPRAZolam (XANAX) 0.5 MG tablet Take 0.5 mg by mouth 2 (two) times daily as needed.  0  . aspirin 81 MG tablet Take 1 tablet (81 mg total) by mouth daily. 30 tablet 5  . budesonide (PULMICORT) 0.5 MG/2ML nebulizer solution Take 2 mLs (0.5 mg total) by nebulization 2 (two) times daily. DX J43.2 120 mL 5  . busPIRone (BUSPAR) 10 MG tablet Take 1 tablet (10 mg total) by mouth 2 (two) times daily. 60 tablet 5  . clonazePAM (KLONOPIN) 0.5 MG tablet Take 0.5 tablets (0.25 mg total) by mouth 2 (two) times daily. 15 tablet 2  . feeding supplement (BOOST HIGH PROTEIN) LIQD Take 237 mLs by mouth 2 (two) times daily between meals. 60 Can 11  . guaiFENesin (MUCINEX) 600 MG 12 hr tablet Take 1 tablet (600 mg total) by mouth 2 (two) times daily as needed. (Patient taking differently: Take 600 mg by mouth 2 (two) times daily as needed for cough or to loosen phlegm. ) 20 tablet 2  . ipratropium-albuterol (DUONEB) 0.5-2.5 (3) MG/3ML SOLN INHALE 1 VIAL VIA NEBULIZER FOUR TIMES DAILY, CAN USE AN ADDITIONAL 2 TREATMENTS AS NEEDED 1620 mL 11  . irbesartan (AVAPRO) 75 MG tablet TAKE 1 TABLET BY MOUTH DAILY 90 tablet 1  . lisinopril-hydrochlorothiazide (PRINZIDE,ZESTORETIC) 20-25 MG per tablet Take 1 tablet by mouth daily.  0  . polyethylene glycol (MIRALAX / GLYCOLAX) packet Take 17 g by mouth daily. 14 each 0  . sertraline (ZOLOFT) 50 MG tablet TAKE 1 TABLET BY MOUTH DAILY 90 tablet 2  . sertraline (ZOLOFT) 50 MG tablet TAKE 1 TABLET BY MOUTH DAILY 30 tablet 0   No current facility-administered medications for this visit.    Functional Status:  In your present  state of health, do you have any difficulty performing the following activities: 03/04/2015 02/14/2015  Hearing? Advance? Y -  Difficulty concentrating or making decisions? Y -  Walking or climbing stairs? Y -  Dressing or bathing? Y -  Doing errands, shopping? Y -  Conservation officer, nature and eating ? - -  Using the Toilet? - -  In the past six months, have you accidently leaked urine? - N  Do you have problems with loss of bowel control? - N  Managing your Medications? - -  Managing your Finances? - -  Housekeeping or managing your Housekeeping? - -    Fall/Depression Screening: PHQ 2/9 Scores 02/20/2015 02/14/2015 12/16/2014 07/17/2014 07/17/2014 12/21/2013 11/23/2013  PHQ - 2 Score 1 4 0 4 0 0 3  PHQ- 9 Score - 18 - 14 - - -  Exception Documentation - Other- indicate reason in comment box - - - - -    Assessment: CSW received phone call from Pharmacist [name] requesting call back. CSW returned phone call and left message. CSW reviewed chart and saw that patient had passed away on 03-15-2015.   Plan: CSW will inform RNCM Erenest Rasher and CMA Lurline Del of case closure.  Eula Fried, BSW, MSW, [email or web address] Phone: [phone] Fax: [phone]

## 2015-03-10 NOTE — Progress Notes (Signed)
FPTS Interim Progress Note  Received a call from patient's nurse stating Hector House was having increased work of breathing on the venti mask and becoming more uncomfortable. Called Palliative and spoke with Dr. Rowe Pavy who recommended increasing Morphine infusion to 6mL/hr from 79mL/hr as well as starting Morphine 2mg  q 15 mins PRN. Appreciate her assistance. Orders were placed. Continue comfort care.  Carlyle Dolly, MD 2015/03/16, 10:17 AM PGY-1, [place] Medicine Service pager [phone]

## 2015-03-10 NOTE — Patient Outreach (Signed)
Had an appointment to see Hector House scheduled for today, 03/10/15. Per EPIC record, patient passed away on 22-Mar-2015. Will contact Nurse Care Manager Erenest Rasher and Social Worker Eula Fried to let them know.  [name], PharmD Clinical Pharmacist [place] Management [phone]

## 2015-03-10 NOTE — Discharge Summary (Signed)
[facility] Discharge/Death Note  Patient name: Hector House Medical record number: [id number] Date of birth: 06-26-1946 Age: 69 y.o. Gender: male Date of Admission: 02/27/2015  Date of Discharge: 03/30/15 Admitting Physician: Alveda Reasons, MD  Primary Care Provider: [name], MD Consultants: Palliative care, CCM  Indication for Hospitalization: SOB   Discharge Diagnoses/Problem List:  Patient Active Problem List   Diagnosis Date Noted  . Encounter for palliative care   . Delirium due to medical condition without behavioral disturbance   . Acute respiratory failure with hypoxia   . SOB (shortness of breath)   . Acute respiratory failure 03/04/2015  . Abdominal pain   . HCAP (healthcare-associated pneumonia) 03/04/2015  . Constipation 02/25/2015  . COPD exacerbation 02/11/2015  . Pressure ulcer 02/11/2015  . Acute respiratory failure with hypercapnia   . Underweight 06/24/2014  . Protein-calorie malnutrition, severe 11/02/2013  . Anxiety disorder due to medical condition 11/01/2013  . 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 History of sarcoma of soft tissue 09/20/2011  . 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 Alcohol use 10/22/2009  . HYPERTENSION, BENIGN ESSENTIAL 09/25/2009  . COPD (chronic obstructive pulmonary disease) with emphysema 09/25/2009  . LOW BACK PAIN SYNDROME 09/25/2009     Discharge Condition: deceased  Discharge Exam:  General: Cachetic male laying in bed, alert but not oriented. In moderate respiratory distress Cardiovascular: RRR, No rubs, gallops or murmurs. Normal s1 and s2 Respiratory: increased work of breathing, using accessory muscles with supraclavicular retractions and subcostal retractions. Crackles and wheezing in bilateral lung fields. Diminished breath sounds at bases Abdomen: Non tender, soft, normal bowel sounds Extremities: no edema  Brief Hospital Course:  Patient presented with worsening SOB. He was  found to meet SIRS criteria on admission. CXR showed multifocal pneumonia. Given history of COPD, began empiric treatment for COPD exacerbation as well. Vanc and zosyn were also started for HCAP treatment. Despite abx and continued respiratory assistance, patient began to experience intermittent delirium and acute respiratory failure with severe sepsis, presumed to be secondary to HCAP. Given lack of improvement, patient's family decided to transition patient to comfort care. He was given adequate morphine to ease his pain and difficulty breathing in his final days.   Significant Procedures: None  Significant Labs and Imaging:  CXR - multifocal pneumonia BNP - 151 EKG - sinus tach with nonischemic PVCs  Recent Labs Lab 03/04/15 0704 03/06/15 0232 03/07/15 1320  WBC 14.0* 19.5* 34.0*  HGB 10.1* 10.8* 12.4*  HCT 30.8* 32.7* 37.5*  PLT 183 218 206    Recent Labs Lab 03/02/2015 2114  03/04/15 1400 03/05/15 0240 03/05/15 1035 03/06/15 0232 03/07/15 1320  NA 137  < > 138 141 139 139 142  K 2.8*  < > 2.7* 2.5* 3.3* 4.3 3.5  CL 88*  < > 100* 100* 99* 99* 97*  CO2 38*  < > 32 34* 32 35* 37*  GLUCOSE 147*  < > 194* 144* 112* 145* 138*  BUN 18  < > 13 10 8 9 14   CREATININE 0.73  < > 0.53* 0.52* 0.49* 0.42* 0.57*  CALCIUM 8.8*  < > 7.6* 8.2* 8.2* 8.1* 8.1*  MG  --   --  1.8 1.8  --  2.1  --   ALKPHOS 73  --   --   --   --   --   --   AST 15  --   --   --   --   --   --  ALT 11*  --   --   --   --   --   --   ALBUMIN 2.8*  --   --   --   --   --   --   < > = values in this interval not displayed.   Verner Mould, MD 03-17-2015, 11:19 PM PGY-1, [place]

## 2015-03-10 NOTE — Progress Notes (Signed)
Pt deceased at 31 with 2 RNs witnessed, [place].MD/N, Family at [facility], support given, [place] at [facility], [employer] called, family updated, all questions answered, pt to be picked up on unit per Family Request.

## 2015-03-10 NOTE — Telephone Encounter (Signed)
LM with [name] letting her know that we were unaware that the pt had passed away and to please disregard Ashley's call from earlier. Nothing further was needed.

## 2015-03-10 NOTE — Progress Notes (Signed)
Dr. Avon Gully notified of pt condition.  No new orders given.

## 2015-03-10 NOTE — Progress Notes (Signed)
Pt sats continue to drop despite increasing oxygen to NRB mask.  Sister, [name] notified.  Attempted to call son, Rocco Pauls, left voicemail for return call.

## 2015-03-10 NOTE — Progress Notes (Signed)
Family Medicine Teaching Service Daily Progress Note Intern Pager: [phone]  Patient name: Hector House Medical record number: [id number] Date of birth: 07-31-1946 Age: 69 y.o. Gender: male  Primary Care Provider: [name], MD Consultants: palliative, CCM  Code Status: DNR  Pt Overview and Major Events to Date:  07/26: Admitted to step down unit  Assessment and Plan:  Acute Respiratory Failure and severe sepsis 2/2 HCAP, also with possible COPD exacerbation: Acute worsening of dyspnea not related to exertion with increased O2 requirement. Admits to productive cough, fever/chills, and left sided chest pain. Meets SIRS criteria on admission with tachypnea, tachycardia, and elevated WBC (21). BP hypotensive to 46T systolic on admission as well. S/p CAT and BiPAP in ED, now on 4L [place] but continuing to be in mild respiratory distress with accessory muscle use. CXR in ED revealed multifocal pneumonia. Initially wheezing per report, now s/p duonebs, solumedrol, and BiPAP, so will treat empirically for COPD exacerbation as well. Will need to rule out cardiac causes of chest pain/SOB. BNP 151 although BNP has been in that range previously. Last Echo in 10/2013 with EF 50-55% and normal diastolic function. No evidence of volume overload on exam with dry MM and no crackles or edema and hypotension on exam. EKG revealed sinus tachycardia with PVCs (nonischemic) and initial troponin negative. ABG with normal pH and mild hypercapnea. -Comfort care -Palliative recommendations, appreciate their assistance -IV Morphine  -Ativan injections -Non rebreather -Frequent respiratory exams  Hypotension with h/o hypertension: on ARB-HCTZ at home.Currently hypotensive in setting of sepsis. -Holding all medications  Hypokalemia: K 2.7>>2.5>>3.3>>4.3 -Resolved -No further labs  UA with granular casts, protein, urobilinogen, ketones: C/w dehydration in setting of sepsis.  - No treatment at this  time  Anxiety: Mood stable  -Ativan injections - Continue Klonopin and Buspar but patient cannot take PO at this time though  Protein-calorie malnutrition, severe: chronic - Patient unable to take PO at this time  Tobacco Abuse - no management at this time  Alcohol Abuse: Patient reported he solves his problems with "[employer]". -No CIWA protocol at this time  Delerium: Waxing and waning. Patient tends to pull out his lines and pulling off tele.  -Take off any unnecessary PO meds  End of Life Care -Palliative consulted   FEN/GI:  heart healthy diet Prophylaxis: sub q heparin   Disposition: Comfort Care  Subjective:  Patient is in respiratory distress and actively dying. He has significantly declined since yesterday. Patient's sister [name] is at bedside and states other family members will be coming later in the morning. I do not think he is stable for hospice transfer at this time.  Objective: Temp:  [97.6 F (36.4 C)-98.7 F (37.1 C)] 98.6 F (37 C) (07/30 0857) Pulse Rate:  [102-118] 107 (07/30 0257) Resp:  [32-39] 35 (07/30 0257) BP: (115-139)/(58-86) 139/72 mmHg (07/30 0257) SpO2:  [87 %-100 %] 100 % (07/30 0257) FiO2 (%):  [60 %] 60 % (07/29 0944) Physical Exam: General: Cachetic male laying in bed, alert but not oriented. In moderate respiratory distress Cardiovascular: RRR, No rubs, gallops or murmurs. Normal s1 and s2 Respiratory: increased work of breathing, using accessory muscles with supraclavicular retractions and subcostal retractions. Crackles and wheezing in bilateral lung fields.  Diminished breath sounds at bases Abdomen: Non tender, soft, normal bowel sounds Extremities: no edema  Laboratory:  Recent Labs Lab 03/04/15 0704 03/06/15 0232 03/07/15 1320  WBC 14.0* 19.5* 34.0*  HGB 10.1* 10.8* 12.4*  HCT 30.8* 32.7* 37.5*  PLT 183 218 206    Recent Labs Lab 02/10/2015 2114  03/05/15 1035 03/06/15 0232 03/07/15 1320  NA 137  < > 139 139 142   K 2.8*  < > 3.3* 4.3 3.5  CL 88*  < > 99* 99* 97*  CO2 38*  < > 32 35* 37*  BUN 18  < > 8 9 14   CREATININE 0.73  < > 0.49* 0.42* 0.57*  CALCIUM 8.8*  < > 8.2* 8.1* 8.1*  PROT 5.9*  --   --   --   --   BILITOT 1.8*  --   --   --   --   ALKPHOS 73  --   --   --   --   ALT 11*  --   --   --   --   AST 15  --   --   --   --   GLUCOSE 147*  < > 112* 145* 138*  < > = values in this interval not displayed. ABG    Component Value Date/Time   PHART 7.464* 03/07/2015 1010   PCO2ART 52.5* 03/07/2015 1010   PO2ART 60.4* 03/07/2015 1010   HCO3 37.2* 03/07/2015 1010   TCO2 38.8 03/07/2015 1010   O2SAT 89.6 03/07/2015 1010     Imaging/Diagnostic Tests: No results found.   Carlyle Dolly, MD 03-12-2015, 9:05 AM PGY-1, [place] Intern pager: [phone], text pages welcome

## 2015-03-10 DEATH — deceased

## 2015-03-11 LAB — BLOOD GAS, ARTERIAL
ACID-BASE EXCESS: 10.7 mmol/L — AB (ref 0.0–2.0)
Acid-Base Excess: 9.6 mmol/L — ABNORMAL HIGH (ref 0.0–2.0)
BICARBONATE: 34.1 meq/L — AB (ref 20.0–24.0)
Bicarbonate: 35.3 mEq/L — ABNORMAL HIGH (ref 20.0–24.0)
Delivery systems: POSITIVE
Drawn by: 441371
Drawn by: 441371
Expiratory PAP: 5
FIO2: 0.5
Inspiratory PAP: 12
LHR: 8 {breaths}/min
O2 CONTENT: 6 L/min
O2 SAT: 92.5 %
O2 Saturation: 88.8 %
PATIENT TEMPERATURE: 98.6
PATIENT TEMPERATURE: 97.5
PCO2 ART: 50.6 mmHg — AB (ref 35.0–45.0)
PH ART: 7.454 — AB (ref 7.350–7.450)
PO2 ART: 57 mmHg — AB (ref 80.0–100.0)
TCO2: 35.7 mmol/L (ref 0–100)
TCO2: 36.9 mmol/L (ref 0–100)
pCO2 arterial: 51.3 mmHg — ABNORMAL HIGH (ref 35.0–45.0)
pH, Arterial: 7.439 (ref 7.350–7.450)
pO2, Arterial: 62.5 mmHg — ABNORMAL LOW (ref 80.0–100.0)

## 2015-03-14 ENCOUNTER — Ambulatory Visit: Payer: Medicare Other | Admitting: Podiatry

## 2015-03-25 ENCOUNTER — Ambulatory Visit: Payer: Medicare Other | Admitting: Pulmonary Disease

## 2015-08-21 ENCOUNTER — Other Ambulatory Visit: Payer: Self-pay

## 2017-05-17 IMAGING — DX DG CHEST 2V
4 series · 4 of 4 positions shown · non-contrast
Comparison: Multiple prior, most recent 12/13/2014, chest CT
11/03/2013

CLINICAL DATA: 68-year-old male with a history of shortness of
breath. No chest pain.

EXAM:
CHEST - 2 VIEW

[chest lat (1 of 2)]
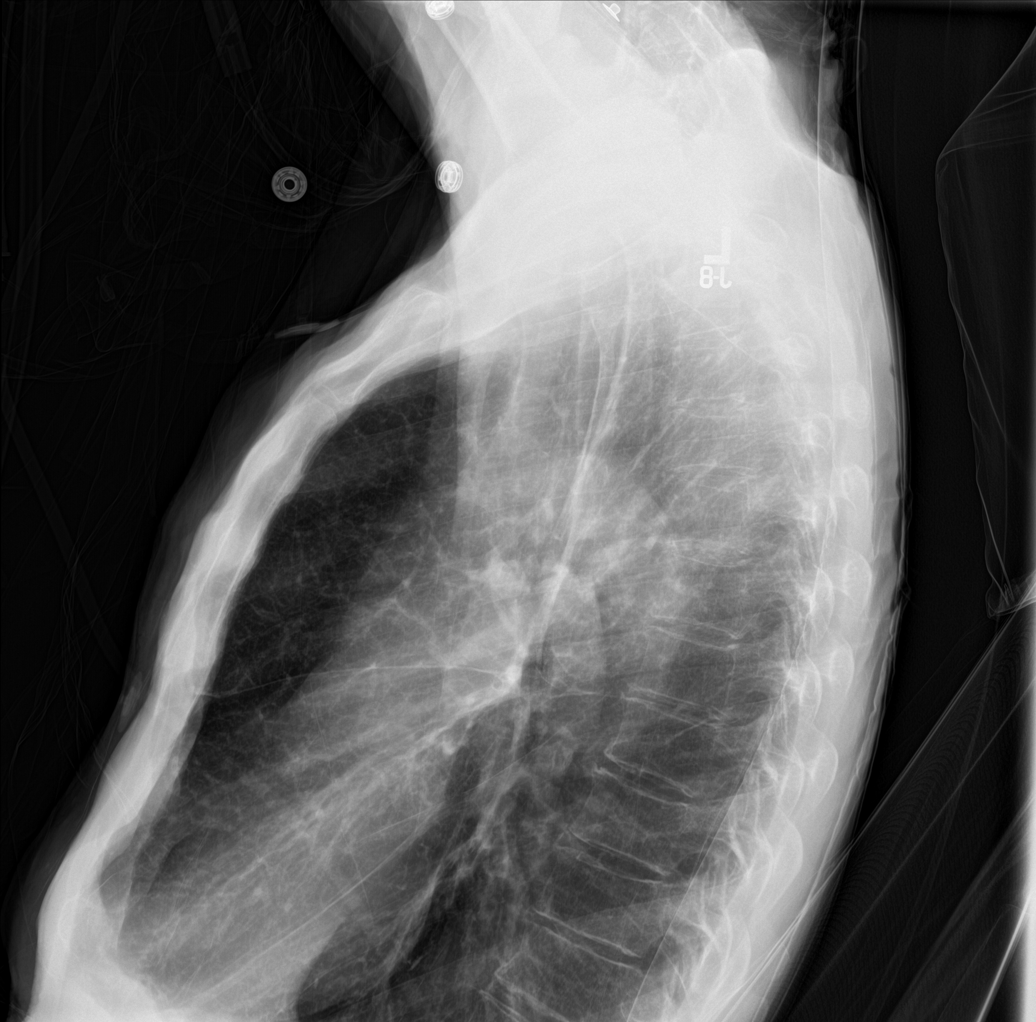

[chest ap (1 of 2)]
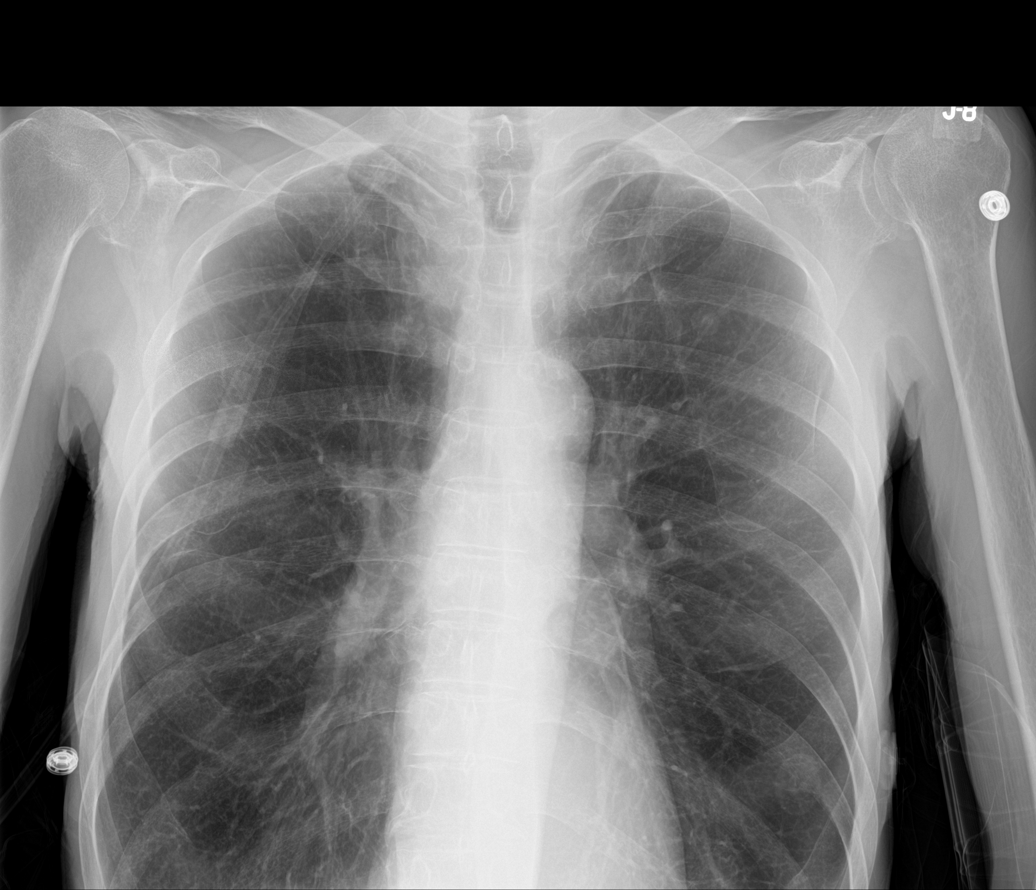

[chest lat (2 of 2)]
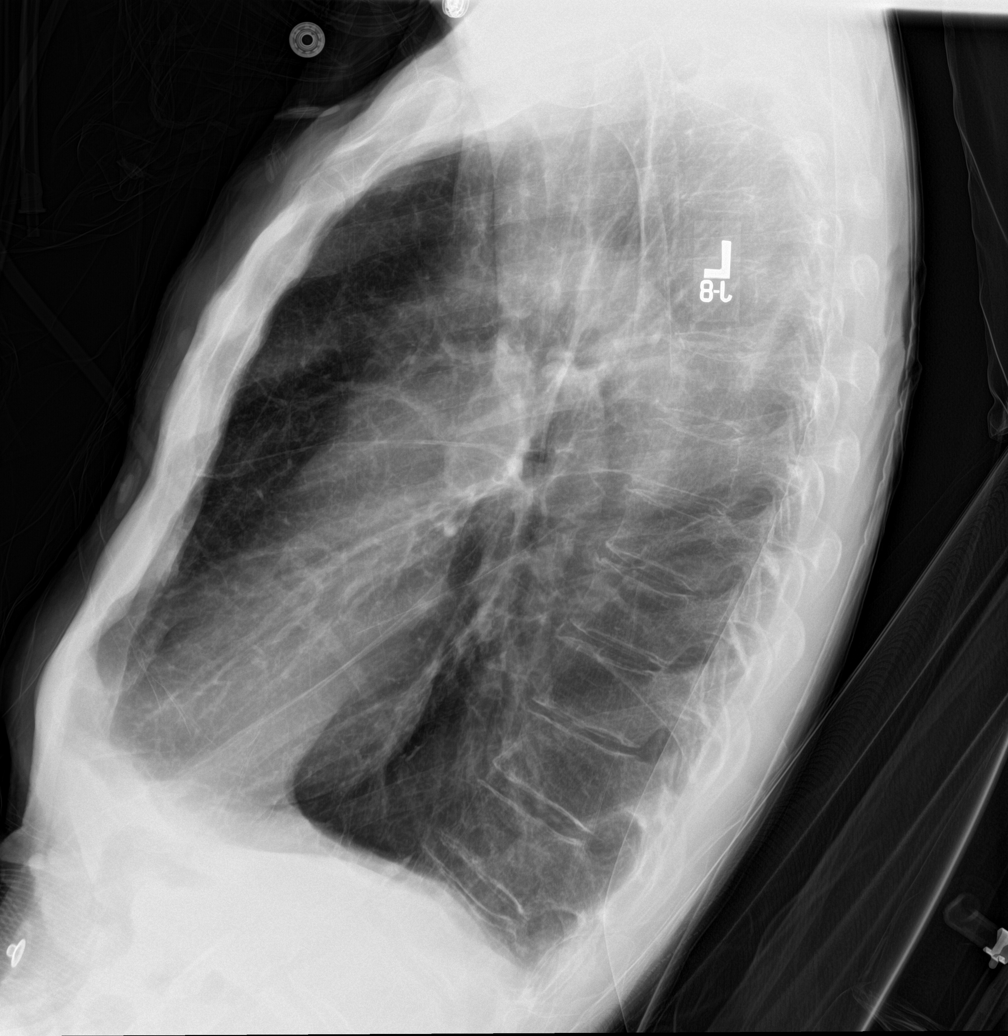

[chest ap (2 of 2)]
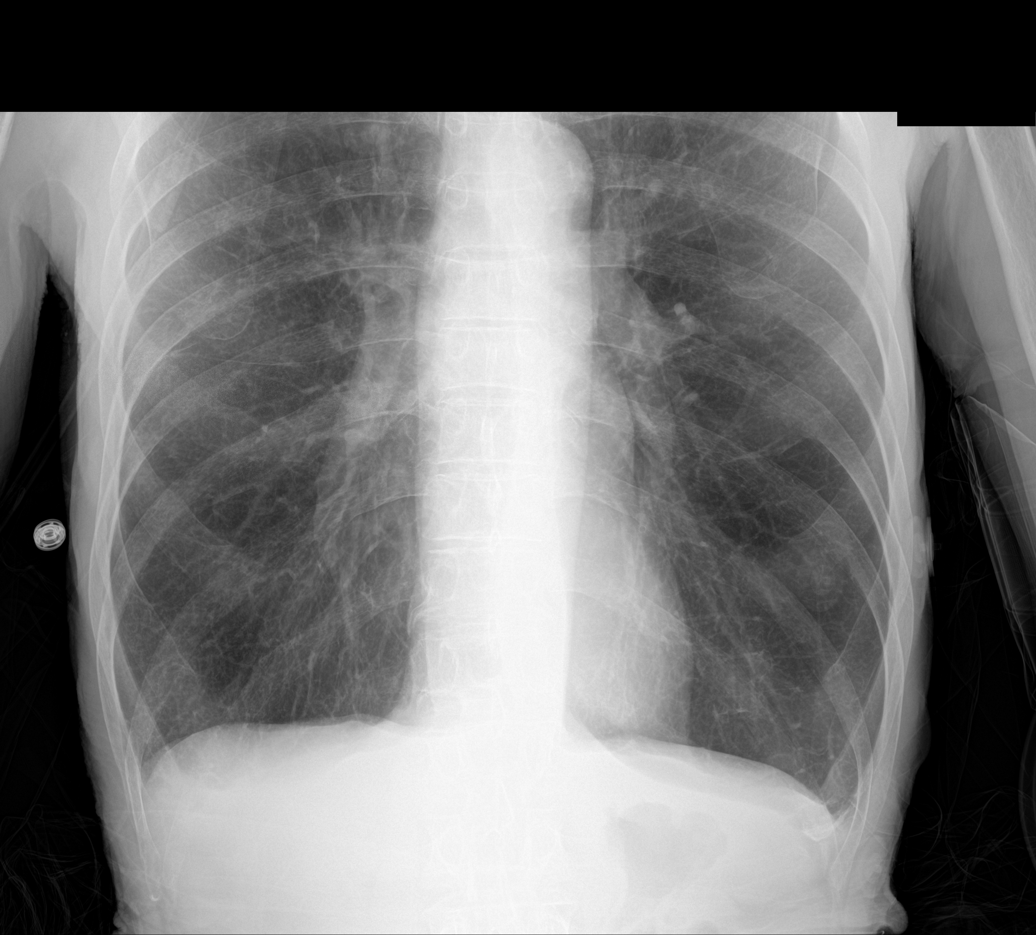

[4 of 4 positions shown; findings below may reference images not displayed]

FINDINGS: Cardiomediastinal silhouette unchanged. No evidence of pulmonary
vascular congestion.

Volume loss of the right middle lobe, with opacity along the right
heart border.

No pneumothorax or pleural effusion.

Stigmata of emphysema, with increased retrosternal airspace,
flattened hemidiaphragms, increased AP diameter, and hyperinflation
on the AP view.

EKG leads project on the chest wall.

No displaced fracture.

Unremarkable appearance of the upper abdomen.
IMPRESSION: No evidence of lobar pneumonia. There is partial volume loss
involving the medial segment of the right middle lobe. This
necessitates follow-up, and a chest x-ray in 1-2 weeks may be
considered to assure resolution, or alternatively, chest CT may be
completed at this time to evaluate for any possible obstructive
hilar lesion.

Advanced emphysema.

## 2021-05-09 DEATH — deceased
